# Patient Record
Sex: Female | Born: 1945 | Race: White | Hispanic: No | Marital: Married | Smoking: Never smoker
Health system: Southern US, Community
[De-identification: ages and names within clinical notes are randomized; demographics above are authoritative.]

## PROBLEM LIST (undated history)

## (undated) DIAGNOSIS — H40009 Preglaucoma, unspecified, unspecified eye: Secondary | ICD-10-CM

## (undated) DIAGNOSIS — K219 Gastro-esophageal reflux disease without esophagitis: Secondary | ICD-10-CM

## (undated) DIAGNOSIS — H269 Unspecified cataract: Secondary | ICD-10-CM

## (undated) DIAGNOSIS — M8580 Other specified disorders of bone density and structure, unspecified site: Secondary | ICD-10-CM

## (undated) DIAGNOSIS — N189 Chronic kidney disease, unspecified: Secondary | ICD-10-CM

## (undated) DIAGNOSIS — E119 Type 2 diabetes mellitus without complications: Secondary | ICD-10-CM

## (undated) DIAGNOSIS — M1990 Unspecified osteoarthritis, unspecified site: Secondary | ICD-10-CM

## (undated) DIAGNOSIS — I10 Essential (primary) hypertension: Secondary | ICD-10-CM

## (undated) DIAGNOSIS — H3530 Unspecified macular degeneration: Secondary | ICD-10-CM

## (undated) DIAGNOSIS — E079 Disorder of thyroid, unspecified: Secondary | ICD-10-CM

## (undated) DIAGNOSIS — T7840XA Allergy, unspecified, initial encounter: Secondary | ICD-10-CM

## (undated) HISTORY — DX: Preglaucoma, unspecified, unspecified eye: H40.009

## (undated) HISTORY — DX: Chronic kidney disease, unspecified: N18.9

## (undated) HISTORY — DX: Unspecified cataract: H26.9

## (undated) HISTORY — PX: OTHER SURGICAL HISTORY: SHX169

## (undated) HISTORY — DX: Unspecified macular degeneration: H35.30

## (undated) HISTORY — PX: CATARACT EXTRACTION: SUR2

## (undated) HISTORY — DX: Unspecified osteoarthritis, unspecified site: M19.90

## (undated) HISTORY — PX: LITHOTRIPSY: SUR834

## (undated) HISTORY — DX: Disorder of thyroid, unspecified: E07.9

## (undated) HISTORY — DX: Type 2 diabetes mellitus without complications: E11.9

## (undated) HISTORY — PX: COLONOSCOPY: SHX174

## (undated) HISTORY — DX: Allergy, unspecified, initial encounter: T78.40XA

## (undated) HISTORY — DX: Other specified disorders of bone density and structure, unspecified site: M85.80

## (undated) HISTORY — DX: Gastro-esophageal reflux disease without esophagitis: K21.9

## (undated) HISTORY — DX: Essential (primary) hypertension: I10

---
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
## 1984-04-22 HISTORY — PX: ABDOMINAL HYSTERECTOMY: SHX81

## 1997-04-22 HISTORY — PX: LUMBAR LAMINECTOMY: SHX95

## 1997-12-12 ENCOUNTER — Other Ambulatory Visit: Admission: RE | Admit: 1997-12-12 | Discharge: 1997-12-12 | Payer: Self-pay | Admitting: Gynecology

## 1998-10-26 ENCOUNTER — Other Ambulatory Visit: Admission: RE | Admit: 1998-10-26 | Discharge: 1998-10-26 | Payer: Self-pay | Admitting: Gastroenterology

## 1998-10-26 ENCOUNTER — Encounter (INDEPENDENT_AMBULATORY_CARE_PROVIDER_SITE_OTHER): Payer: Self-pay | Admitting: Specialist

## 1998-12-13 ENCOUNTER — Other Ambulatory Visit: Admission: RE | Admit: 1998-12-13 | Discharge: 1998-12-13 | Payer: Self-pay | Admitting: Gynecology

## 2000-02-05 ENCOUNTER — Other Ambulatory Visit: Admission: RE | Admit: 2000-02-05 | Discharge: 2000-02-05 | Payer: Self-pay | Admitting: Gynecology

## 2000-04-22 HISTORY — PX: KNEE ARTHROSCOPY: SUR90

## 2001-10-21 ENCOUNTER — Encounter (HOSPITAL_BASED_OUTPATIENT_CLINIC_OR_DEPARTMENT_OTHER): Payer: Self-pay | Admitting: General Surgery

## 2001-10-27 ENCOUNTER — Ambulatory Visit (HOSPITAL_COMMUNITY): Admission: RE | Admit: 2001-10-27 | Discharge: 2001-10-27 | Payer: Self-pay | Admitting: General Surgery

## 2001-10-27 ENCOUNTER — Encounter (INDEPENDENT_AMBULATORY_CARE_PROVIDER_SITE_OTHER): Payer: Self-pay | Admitting: *Deleted

## 2002-02-22 ENCOUNTER — Other Ambulatory Visit: Admission: RE | Admit: 2002-02-22 | Discharge: 2002-02-22 | Payer: Self-pay | Admitting: Gynecology

## 2002-08-20 ENCOUNTER — Ambulatory Visit (HOSPITAL_BASED_OUTPATIENT_CLINIC_OR_DEPARTMENT_OTHER): Admission: RE | Admit: 2002-08-20 | Discharge: 2002-08-20 | Payer: Self-pay | Admitting: Gynecology

## 2003-03-10 ENCOUNTER — Ambulatory Visit (HOSPITAL_COMMUNITY): Admission: RE | Admit: 2003-03-10 | Discharge: 2003-03-10 | Payer: Self-pay | Admitting: Urology

## 2003-07-06 ENCOUNTER — Ambulatory Visit (HOSPITAL_COMMUNITY): Admission: RE | Admit: 2003-07-06 | Discharge: 2003-07-06 | Payer: Self-pay | Admitting: Family Medicine

## 2003-12-30 ENCOUNTER — Other Ambulatory Visit: Admission: RE | Admit: 2003-12-30 | Discharge: 2003-12-30 | Payer: Self-pay | Admitting: Gynecology

## 2004-04-04 ENCOUNTER — Other Ambulatory Visit: Admission: RE | Admit: 2004-04-04 | Discharge: 2004-04-04 | Payer: Self-pay | Admitting: Gynecology

## 2004-11-29 ENCOUNTER — Ambulatory Visit: Payer: Self-pay | Admitting: Family Medicine

## 2005-01-15 ENCOUNTER — Other Ambulatory Visit: Admission: RE | Admit: 2005-01-15 | Discharge: 2005-01-15 | Payer: Self-pay | Admitting: Gynecology

## 2005-02-27 ENCOUNTER — Ambulatory Visit (HOSPITAL_COMMUNITY): Admission: RE | Admit: 2005-02-27 | Discharge: 2005-02-27 | Payer: Self-pay | Admitting: Orthopedic Surgery

## 2005-02-27 HISTORY — PX: ROTATOR CUFF REPAIR: SHX139

## 2006-01-03 ENCOUNTER — Ambulatory Visit: Payer: Self-pay | Admitting: Family Medicine

## 2006-01-14 ENCOUNTER — Ambulatory Visit: Payer: Self-pay | Admitting: Family Medicine

## 2006-01-17 ENCOUNTER — Other Ambulatory Visit: Admission: RE | Admit: 2006-01-17 | Discharge: 2006-01-17 | Payer: Self-pay | Admitting: Gynecology

## 2006-04-30 ENCOUNTER — Ambulatory Visit: Payer: Self-pay | Admitting: Internal Medicine

## 2006-06-20 ENCOUNTER — Ambulatory Visit: Payer: Self-pay | Admitting: Internal Medicine

## 2006-07-29 ENCOUNTER — Ambulatory Visit: Payer: Self-pay | Admitting: Internal Medicine

## 2006-07-29 ENCOUNTER — Encounter: Payer: Self-pay | Admitting: Internal Medicine

## 2006-07-29 DIAGNOSIS — I10 Essential (primary) hypertension: Secondary | ICD-10-CM | POA: Insufficient documentation

## 2006-10-27 ENCOUNTER — Ambulatory Visit: Payer: Self-pay | Admitting: Family Medicine

## 2006-10-27 DIAGNOSIS — J309 Allergic rhinitis, unspecified: Secondary | ICD-10-CM | POA: Insufficient documentation

## 2006-10-27 DIAGNOSIS — E039 Hypothyroidism, unspecified: Secondary | ICD-10-CM

## 2006-10-27 DIAGNOSIS — R51 Headache: Secondary | ICD-10-CM | POA: Insufficient documentation

## 2006-10-27 DIAGNOSIS — Z9889 Other specified postprocedural states: Secondary | ICD-10-CM

## 2006-10-27 DIAGNOSIS — J45909 Unspecified asthma, uncomplicated: Secondary | ICD-10-CM | POA: Insufficient documentation

## 2006-10-27 DIAGNOSIS — R519 Headache, unspecified: Secondary | ICD-10-CM | POA: Insufficient documentation

## 2006-10-27 DIAGNOSIS — Z87442 Personal history of urinary calculi: Secondary | ICD-10-CM

## 2006-10-27 DIAGNOSIS — K219 Gastro-esophageal reflux disease without esophagitis: Secondary | ICD-10-CM

## 2006-10-27 HISTORY — DX: Hypothyroidism, unspecified: E03.9

## 2006-10-29 LAB — CONVERTED CEMR LAB
ALT: 29 units/L (ref 0–35)
AST: 29 units/L (ref 0–37)
Albumin: 4.4 g/dL (ref 3.5–5.2)
Alkaline Phosphatase: 55 units/L (ref 39–117)
BUN: 17 mg/dL (ref 6–23)
Basophils Absolute: 0 10*3/uL (ref 0.0–0.1)
Basophils Relative: 0 % (ref 0.0–1.0)
Bilirubin, Direct: 0.1 mg/dL (ref 0.0–0.3)
CO2: 29 meq/L (ref 19–32)
Calcium: 9.6 mg/dL (ref 8.4–10.5)
Chloride: 111 meq/L (ref 96–112)
Cholesterol: 168 mg/dL (ref 0–200)
Creatinine, Ser: 0.7 mg/dL (ref 0.4–1.2)
Eosinophils Absolute: 0.2 10*3/uL (ref 0.0–0.6)
Eosinophils Relative: 2.5 % (ref 0.0–5.0)
GFR calc Af Amer: 109 mL/min
GFR calc non Af Amer: 90 mL/min
Glucose, Bld: 107 mg/dL — ABNORMAL HIGH (ref 70–99)
HCT: 38.1 % (ref 36.0–46.0)
HDL: 51.1 mg/dL (ref >39.0)
Hemoglobin: 13.3 g/dL (ref 12.0–15.0)
LDL Cholesterol: 99 mg/dL (ref 0–99)
Lymphocytes Relative: 45.1 % (ref 12.0–46.0)
MCHC: 35 g/dL (ref 30.0–36.0)
MCV: 91.1 fL (ref 78.0–100.0)
Monocytes Absolute: 0.4 10*3/uL (ref 0.2–0.7)
Monocytes Relative: 5.3 % (ref 3.0–11.0)
Neutro Abs: 3.3 10*3/uL (ref 1.4–7.7)
Neutrophils Relative %: 47.1 % (ref 43.0–77.0)
Platelets: 216 10*3/uL (ref 150–400)
Potassium: 4.1 meq/L (ref 3.5–5.1)
RBC: 4.19 M/uL (ref 3.87–5.11)
RDW: 13 % (ref 11.5–14.6)
Sodium: 144 meq/L (ref 135–145)
TSH: 1.13 microintl units/mL (ref 0.35–5.50)
Total Bilirubin: 0.7 mg/dL (ref 0.3–1.2)
Total CHOL/HDL Ratio: 3.3
Total Protein: 7.7 g/dL (ref 6.0–8.3)
Triglycerides: 92 mg/dL (ref 0–149)
VLDL: 18 mg/dL (ref 0–40)
WBC: 7.1 10*3/uL (ref 4.5–10.5)

## 2006-11-11 ENCOUNTER — Telehealth (INDEPENDENT_AMBULATORY_CARE_PROVIDER_SITE_OTHER): Payer: Self-pay | Admitting: *Deleted

## 2006-12-31 LAB — CONVERTED CEMR LAB: Pap Smear: ABNORMAL

## 2007-01-05 ENCOUNTER — Telehealth (INDEPENDENT_AMBULATORY_CARE_PROVIDER_SITE_OTHER): Payer: Self-pay | Admitting: *Deleted

## 2007-01-05 ENCOUNTER — Encounter: Payer: Self-pay | Admitting: Family Medicine

## 2007-01-19 ENCOUNTER — Other Ambulatory Visit: Admission: RE | Admit: 2007-01-19 | Discharge: 2007-01-19 | Payer: Self-pay | Admitting: Gynecology

## 2007-01-23 LAB — CONVERTED CEMR LAB: Pap Smear: NORMAL

## 2007-11-12 ENCOUNTER — Ambulatory Visit: Payer: Self-pay | Admitting: Family Medicine

## 2007-11-22 ENCOUNTER — Encounter (INDEPENDENT_AMBULATORY_CARE_PROVIDER_SITE_OTHER): Payer: Self-pay | Admitting: *Deleted

## 2007-11-22 LAB — CONVERTED CEMR LAB
ALT: 29 units/L (ref 0–35)
AST: 31 units/L (ref 0–37)
Albumin: 4.7 g/dL (ref 3.5–5.2)
Alkaline Phosphatase: 61 units/L (ref 39–117)
BUN: 17 mg/dL (ref 6–23)
Basophils Absolute: 0.1 10*3/uL (ref 0.0–0.1)
Basophils Relative: 0.9 % (ref 0.0–3.0)
Bilirubin, Direct: 0.1 mg/dL (ref 0.0–0.3)
CO2: 27 meq/L (ref 19–32)
Calcium: 9.5 mg/dL (ref 8.4–10.5)
Chloride: 103 meq/L (ref 96–112)
Cholesterol: 169 mg/dL (ref 0–200)
Creatinine, Ser: 0.8 mg/dL (ref 0.4–1.2)
Eosinophils Absolute: 0.1 10*3/uL (ref 0.0–0.7)
Eosinophils Relative: 1.4 % (ref 0.0–5.0)
GFR calc Af Amer: 93 mL/min
GFR calc non Af Amer: 77 mL/min
Glucose, Bld: 104 mg/dL — ABNORMAL HIGH (ref 70–99)
HCT: 38.8 % (ref 36.0–46.0)
HDL: 47 mg/dL (ref >39.0)
Hemoglobin: 13.4 g/dL (ref 12.0–15.0)
LDL Cholesterol: 103 mg/dL — ABNORMAL HIGH (ref 0–99)
Lymphocytes Relative: 38.2 % (ref 12.0–46.0)
MCHC: 34.6 g/dL (ref 30.0–36.0)
MCV: 94.1 fL (ref 78.0–100.0)
Monocytes Absolute: 0.4 10*3/uL (ref 0.1–1.0)
Monocytes Relative: 4.9 % (ref 3.0–12.0)
Neutro Abs: 4.1 10*3/uL (ref 1.4–7.7)
Neutrophils Relative %: 54.6 % (ref 43.0–77.0)
Platelets: 209 10*3/uL (ref 150–400)
Potassium: 4.1 meq/L (ref 3.5–5.1)
RBC: 4.12 M/uL (ref 3.87–5.11)
RDW: 13 % (ref 11.5–14.6)
Sodium: 141 meq/L (ref 135–145)
TSH: 1.82 microintl units/mL (ref 0.35–5.50)
Total Bilirubin: 0.8 mg/dL (ref 0.3–1.2)
Total CHOL/HDL Ratio: 3.6
Total Protein: 8.5 g/dL — ABNORMAL HIGH (ref 6.0–8.3)
Triglycerides: 94 mg/dL (ref 0–149)
VLDL: 19 mg/dL (ref 0–40)
WBC: 7.6 10*3/uL (ref 4.5–10.5)

## 2007-12-01 ENCOUNTER — Telehealth (INDEPENDENT_AMBULATORY_CARE_PROVIDER_SITE_OTHER): Payer: Self-pay | Admitting: *Deleted

## 2007-12-29 ENCOUNTER — Ambulatory Visit: Payer: Self-pay | Admitting: Family Medicine

## 2007-12-29 ENCOUNTER — Encounter (INDEPENDENT_AMBULATORY_CARE_PROVIDER_SITE_OTHER): Payer: Self-pay | Admitting: *Deleted

## 2007-12-29 ENCOUNTER — Encounter: Payer: Self-pay | Admitting: Family Medicine

## 2007-12-29 ENCOUNTER — Other Ambulatory Visit: Admission: RE | Admit: 2007-12-29 | Discharge: 2007-12-29 | Payer: Self-pay | Admitting: Family Medicine

## 2007-12-29 DIAGNOSIS — H268 Other specified cataract: Secondary | ICD-10-CM | POA: Insufficient documentation

## 2007-12-29 DIAGNOSIS — I080 Rheumatic disorders of both mitral and aortic valves: Secondary | ICD-10-CM | POA: Insufficient documentation

## 2007-12-29 DIAGNOSIS — H409 Unspecified glaucoma: Secondary | ICD-10-CM | POA: Insufficient documentation

## 2008-01-04 ENCOUNTER — Encounter (INDEPENDENT_AMBULATORY_CARE_PROVIDER_SITE_OTHER): Payer: Self-pay | Admitting: *Deleted

## 2008-02-05 ENCOUNTER — Encounter: Payer: Self-pay | Admitting: Family Medicine

## 2008-03-07 ENCOUNTER — Ambulatory Visit: Payer: Self-pay | Admitting: Family Medicine

## 2008-03-07 DIAGNOSIS — J111 Influenza due to unidentified influenza virus with other respiratory manifestations: Secondary | ICD-10-CM | POA: Insufficient documentation

## 2008-12-13 ENCOUNTER — Other Ambulatory Visit: Admission: RE | Admit: 2008-12-13 | Discharge: 2008-12-13 | Payer: Self-pay | Admitting: Family Medicine

## 2008-12-13 ENCOUNTER — Ambulatory Visit: Payer: Self-pay | Admitting: Family Medicine

## 2008-12-13 ENCOUNTER — Encounter: Payer: Self-pay | Admitting: Family Medicine

## 2008-12-13 DIAGNOSIS — E8941 Symptomatic postprocedural ovarian failure: Secondary | ICD-10-CM | POA: Insufficient documentation

## 2008-12-20 ENCOUNTER — Encounter (INDEPENDENT_AMBULATORY_CARE_PROVIDER_SITE_OTHER): Payer: Self-pay | Admitting: *Deleted

## 2008-12-22 ENCOUNTER — Ambulatory Visit: Payer: Self-pay | Admitting: Family Medicine

## 2008-12-27 ENCOUNTER — Ambulatory Visit: Payer: Self-pay | Admitting: Family Medicine

## 2008-12-27 LAB — CONVERTED CEMR LAB
OCCULT 1: NEGATIVE
OCCULT 2: NEGATIVE
OCCULT 3: NEGATIVE

## 2008-12-28 ENCOUNTER — Encounter (INDEPENDENT_AMBULATORY_CARE_PROVIDER_SITE_OTHER): Payer: Self-pay | Admitting: *Deleted

## 2008-12-29 ENCOUNTER — Telehealth (INDEPENDENT_AMBULATORY_CARE_PROVIDER_SITE_OTHER): Payer: Self-pay | Admitting: *Deleted

## 2008-12-29 ENCOUNTER — Encounter (INDEPENDENT_AMBULATORY_CARE_PROVIDER_SITE_OTHER): Payer: Self-pay | Admitting: *Deleted

## 2008-12-29 LAB — CONVERTED CEMR LAB: Hgb A1c MFr Bld: 7 % — ABNORMAL HIGH (ref 4.6–6.5)

## 2009-01-09 ENCOUNTER — Telehealth: Payer: Self-pay | Admitting: Family Medicine

## 2009-01-09 ENCOUNTER — Ambulatory Visit: Payer: Self-pay | Admitting: Family Medicine

## 2009-01-09 DIAGNOSIS — N390 Urinary tract infection, site not specified: Secondary | ICD-10-CM

## 2009-01-09 LAB — CONVERTED CEMR LAB
Bilirubin Urine: NEGATIVE
Glucose, Urine, Semiquant: NEGATIVE
Ketones, urine, test strip: NEGATIVE
Nitrite: NEGATIVE
Protein, U semiquant: NEGATIVE
Specific Gravity, Urine: 1.015
Urobilinogen, UA: 0.2
pH: 6.5

## 2009-01-10 ENCOUNTER — Encounter: Payer: Self-pay | Admitting: Family Medicine

## 2009-01-13 ENCOUNTER — Ambulatory Visit: Payer: Self-pay | Admitting: Family Medicine

## 2009-01-13 DIAGNOSIS — E119 Type 2 diabetes mellitus without complications: Secondary | ICD-10-CM | POA: Insufficient documentation

## 2009-01-13 DIAGNOSIS — E1151 Type 2 diabetes mellitus with diabetic peripheral angiopathy without gangrene: Secondary | ICD-10-CM

## 2009-01-13 DIAGNOSIS — E1165 Type 2 diabetes mellitus with hyperglycemia: Secondary | ICD-10-CM

## 2009-01-19 ENCOUNTER — Telehealth: Payer: Self-pay | Admitting: Family Medicine

## 2009-01-23 ENCOUNTER — Telehealth (INDEPENDENT_AMBULATORY_CARE_PROVIDER_SITE_OTHER): Payer: Self-pay | Admitting: *Deleted

## 2009-02-01 ENCOUNTER — Encounter: Admission: RE | Admit: 2009-02-01 | Discharge: 2009-04-19 | Payer: Self-pay | Admitting: Family Medicine

## 2009-02-04 ENCOUNTER — Encounter: Payer: Self-pay | Admitting: Family Medicine

## 2009-02-06 ENCOUNTER — Encounter: Payer: Self-pay | Admitting: Family Medicine

## 2009-02-15 ENCOUNTER — Encounter: Payer: Self-pay | Admitting: Family Medicine

## 2009-03-06 ENCOUNTER — Encounter (INDEPENDENT_AMBULATORY_CARE_PROVIDER_SITE_OTHER): Payer: Self-pay | Admitting: *Deleted

## 2009-03-20 ENCOUNTER — Telehealth: Payer: Self-pay | Admitting: Family Medicine

## 2009-03-27 ENCOUNTER — Ambulatory Visit: Payer: Self-pay | Admitting: Family Medicine

## 2009-03-27 DIAGNOSIS — M949 Disorder of cartilage, unspecified: Secondary | ICD-10-CM

## 2009-03-27 DIAGNOSIS — M899 Disorder of bone, unspecified: Secondary | ICD-10-CM | POA: Insufficient documentation

## 2009-03-28 ENCOUNTER — Telehealth (INDEPENDENT_AMBULATORY_CARE_PROVIDER_SITE_OTHER): Payer: Self-pay | Admitting: *Deleted

## 2009-03-28 LAB — CONVERTED CEMR LAB
ALT: 27 units/L (ref 0–35)
AST: 25 units/L (ref 0–37)
Albumin: 4.6 g/dL (ref 3.5–5.2)
Alkaline Phosphatase: 66 units/L (ref 39–117)
BUN: 13 mg/dL (ref 6–23)
Bilirubin, Direct: 0 mg/dL (ref 0.0–0.3)
CO2: 30 meq/L (ref 19–32)
Calcium: 9.7 mg/dL (ref 8.4–10.5)
Chloride: 105 meq/L (ref 96–112)
Cholesterol: 140 mg/dL (ref 0–200)
Creatinine, Ser: 0.8 mg/dL (ref 0.4–1.2)
Creatinine,U: 54.9 mg/dL
Free T4: 2.1 ng/dL — ABNORMAL HIGH (ref 0.6–1.6)
GFR calc non Af Amer: 76.4 mL/min (ref >60)
Glucose, Bld: 109 mg/dL — ABNORMAL HIGH (ref 70–99)
HDL: 47.3 mg/dL (ref >39.00)
Hgb A1c MFr Bld: 6.4 % (ref 4.6–6.5)
LDL Cholesterol: 77 mg/dL (ref 0–99)
Microalb Creat Ratio: 14.6 mg/g (ref 0.0–30.0)
Microalb, Ur: 0.8 mg/dL (ref 0.0–1.9)
Potassium: 4.2 meq/L (ref 3.5–5.1)
Sodium: 146 meq/L — ABNORMAL HIGH (ref 135–145)
T3, Free: 3.9 pg/mL (ref 2.3–4.2)
TSH: 0.08 microintl units/mL — ABNORMAL LOW (ref 0.35–5.50)
Total Bilirubin: 0.7 mg/dL (ref 0.3–1.2)
Total CHOL/HDL Ratio: 3
Total Protein: 7.7 g/dL (ref 6.0–8.3)
Triglycerides: 80 mg/dL (ref 0.0–149.0)
VLDL: 16 mg/dL (ref 0.0–40.0)

## 2009-03-29 ENCOUNTER — Encounter (INDEPENDENT_AMBULATORY_CARE_PROVIDER_SITE_OTHER): Payer: Self-pay | Admitting: *Deleted

## 2009-03-29 LAB — CONVERTED CEMR LAB: Vit D, 25-Hydroxy: 87 ng/mL (ref 30–89)

## 2009-03-30 ENCOUNTER — Telehealth (INDEPENDENT_AMBULATORY_CARE_PROVIDER_SITE_OTHER): Payer: Self-pay | Admitting: *Deleted

## 2009-05-02 ENCOUNTER — Encounter: Payer: Self-pay | Admitting: Family Medicine

## 2009-06-20 ENCOUNTER — Telehealth: Payer: Self-pay | Admitting: Family Medicine

## 2009-06-21 ENCOUNTER — Ambulatory Visit: Payer: Self-pay | Admitting: Family Medicine

## 2009-07-03 LAB — CONVERTED CEMR LAB
ALT: 25 units/L (ref 0–35)
AST: 20 units/L (ref 0–37)
Albumin: 4.4 g/dL (ref 3.5–5.2)
Alkaline Phosphatase: 66 units/L (ref 39–117)
BUN: 16 mg/dL (ref 6–23)
Bilirubin, Direct: 0.1 mg/dL (ref 0.0–0.3)
CO2: 30 meq/L (ref 19–32)
Calcium: 9.4 mg/dL (ref 8.4–10.5)
Chloride: 104 meq/L (ref 96–112)
Cholesterol: 130 mg/dL (ref 0–200)
Creatinine, Ser: 0.8 mg/dL (ref 0.4–1.2)
Creatinine,U: 10.4 mg/dL
GFR calc non Af Amer: 76.34 mL/min (ref >60)
Glucose, Bld: 118 mg/dL — ABNORMAL HIGH (ref 70–99)
HDL: 59.9 mg/dL (ref >39.00)
Hgb A1c MFr Bld: 6.3 % (ref 4.6–6.5)
LDL Cholesterol: 56 mg/dL (ref 0–99)
Microalb Creat Ratio: 28.8 mg/g (ref 0.0–30.0)
Microalb, Ur: 0.3 mg/dL (ref 0.0–1.9)
Potassium: 4.8 meq/L (ref 3.5–5.1)
Sodium: 141 meq/L (ref 135–145)
Total Bilirubin: 0.5 mg/dL (ref 0.3–1.2)
Total CHOL/HDL Ratio: 2
Total Protein: 8.3 g/dL (ref 6.0–8.3)
Triglycerides: 69 mg/dL (ref 0.0–149.0)
VLDL: 13.8 mg/dL (ref 0.0–40.0)

## 2009-07-25 ENCOUNTER — Telehealth (INDEPENDENT_AMBULATORY_CARE_PROVIDER_SITE_OTHER): Payer: Self-pay | Admitting: *Deleted

## 2009-07-31 ENCOUNTER — Telehealth: Payer: Self-pay | Admitting: Family Medicine

## 2009-07-31 ENCOUNTER — Encounter: Payer: Self-pay | Admitting: Family Medicine

## 2009-08-23 ENCOUNTER — Encounter: Payer: Self-pay | Admitting: Family Medicine

## 2009-10-25 ENCOUNTER — Telehealth (INDEPENDENT_AMBULATORY_CARE_PROVIDER_SITE_OTHER): Payer: Self-pay | Admitting: *Deleted

## 2009-12-14 ENCOUNTER — Ambulatory Visit: Payer: Self-pay | Admitting: Family Medicine

## 2009-12-14 DIAGNOSIS — H9319 Tinnitus, unspecified ear: Secondary | ICD-10-CM | POA: Insufficient documentation

## 2009-12-14 DIAGNOSIS — D239 Other benign neoplasm of skin, unspecified: Secondary | ICD-10-CM | POA: Insufficient documentation

## 2009-12-18 ENCOUNTER — Encounter (INDEPENDENT_AMBULATORY_CARE_PROVIDER_SITE_OTHER): Payer: Self-pay | Admitting: *Deleted

## 2009-12-29 ENCOUNTER — Encounter: Payer: Self-pay | Admitting: Family Medicine

## 2010-01-17 ENCOUNTER — Ambulatory Visit: Payer: Self-pay | Admitting: Family Medicine

## 2010-01-18 LAB — CONVERTED CEMR LAB: Fecal Occult Bld: NEGATIVE

## 2010-02-01 ENCOUNTER — Encounter: Payer: Self-pay | Admitting: Family Medicine

## 2010-02-07 ENCOUNTER — Encounter: Payer: Self-pay | Admitting: Family Medicine

## 2010-02-12 ENCOUNTER — Ambulatory Visit: Payer: Self-pay | Admitting: Family Medicine

## 2010-02-14 LAB — CONVERTED CEMR LAB
ALT: 23 units/L (ref 0–35)
AST: 21 units/L (ref 0–37)
Albumin: 4.5 g/dL (ref 3.5–5.2)
Alkaline Phosphatase: 52 units/L (ref 39–117)
BUN: 22 mg/dL (ref 6–23)
Bilirubin, Direct: 0.1 mg/dL (ref 0.0–0.3)
CO2: 26 meq/L (ref 19–32)
Calcium: 9.6 mg/dL (ref 8.4–10.5)
Chloride: 104 meq/L (ref 96–112)
Cholesterol: 114 mg/dL (ref 0–200)
Creatinine, Ser: 1 mg/dL (ref 0.4–1.2)
GFR calc non Af Amer: 62.49 mL/min (ref >60)
Glucose, Bld: 106 mg/dL — ABNORMAL HIGH (ref 70–99)
HDL: 46.3 mg/dL (ref >39.00)
Hgb A1c MFr Bld: 6.4 % (ref 4.6–6.5)
LDL Cholesterol: 56 mg/dL (ref 0–99)
Potassium: 5.1 meq/L (ref 3.5–5.1)
Sodium: 139 meq/L (ref 135–145)
TSH: 0.42 microintl units/mL (ref 0.35–5.50)
Total Bilirubin: 0.5 mg/dL (ref 0.3–1.2)
Total CHOL/HDL Ratio: 2
Total Protein: 7.6 g/dL (ref 6.0–8.3)
Triglycerides: 58 mg/dL (ref 0.0–149.0)
VLDL: 11.6 mg/dL (ref 0.0–40.0)

## 2010-02-23 ENCOUNTER — Encounter: Payer: Self-pay | Admitting: Family Medicine

## 2010-05-08 ENCOUNTER — Telehealth (INDEPENDENT_AMBULATORY_CARE_PROVIDER_SITE_OTHER): Payer: Self-pay | Admitting: *Deleted

## 2010-05-08 ENCOUNTER — Telehealth: Payer: Self-pay | Admitting: Family Medicine

## 2010-05-15 ENCOUNTER — Ambulatory Visit
Admission: RE | Admit: 2010-05-15 | Discharge: 2010-05-15 | Payer: Self-pay | Source: Home / Self Care | Attending: Family Medicine | Admitting: Family Medicine

## 2010-05-15 ENCOUNTER — Other Ambulatory Visit: Payer: Self-pay | Admitting: Family Medicine

## 2010-05-15 ENCOUNTER — Telehealth: Payer: Self-pay | Admitting: Family Medicine

## 2010-05-15 LAB — BASIC METABOLIC PANEL
BUN: 14 mg/dL (ref 6–23)
CO2: 28 mEq/L (ref 19–32)
Calcium: 9.8 mg/dL (ref 8.4–10.5)
Chloride: 105 mEq/L (ref 96–112)
Creatinine, Ser: 0.9 mg/dL (ref 0.4–1.2)
GFR: 64.79 mL/min (ref >60.00)
Glucose, Bld: 119 mg/dL — ABNORMAL HIGH (ref 70–99)
Potassium: 5.1 mEq/L (ref 3.5–5.1)
Sodium: 142 mEq/L (ref 135–145)

## 2010-05-15 LAB — HEPATIC FUNCTION PANEL
ALT: 25 U/L (ref 0–35)
AST: 23 U/L (ref 0–37)
Albumin: 4.5 g/dL (ref 3.5–5.2)
Alkaline Phosphatase: 55 U/L (ref 39–117)
Bilirubin, Direct: 0.1 mg/dL (ref 0.0–0.3)
Total Bilirubin: 0.5 mg/dL (ref 0.3–1.2)
Total Protein: 7.8 g/dL (ref 6.0–8.3)

## 2010-05-15 LAB — LIPID PANEL
Cholesterol: 133 mg/dL (ref 0–200)
HDL: 51.1 mg/dL (ref >39.00)
LDL Cholesterol: 64 mg/dL (ref 0–99)
Total CHOL/HDL Ratio: 3
Triglycerides: 88 mg/dL (ref 0.0–149.0)
VLDL: 17.6 mg/dL (ref 0.0–40.0)

## 2010-05-15 LAB — TSH: TSH: 0.89 u[IU]/mL (ref 0.35–5.50)

## 2010-05-15 LAB — HEMOGLOBIN A1C: Hgb A1c MFr Bld: 6.5 % (ref 4.6–6.5)

## 2010-05-20 LAB — CONVERTED CEMR LAB
ALT: 28 units/L (ref 0–35)
ALT: 29 units/L (ref 0–35)
AST: 26 units/L (ref 0–37)
AST: 27 units/L (ref 0–37)
Albumin: 4.3 g/dL (ref 3.5–5.2)
Albumin: 4.5 g/dL (ref 3.5–5.2)
Alkaline Phosphatase: 54 units/L (ref 39–117)
Alkaline Phosphatase: 71 units/L (ref 39–117)
BUN: 18 mg/dL (ref 6–23)
BUN: 18 mg/dL (ref 6–23)
Basophils Absolute: 0 10*3/uL (ref 0.0–0.1)
Basophils Absolute: 0.1 10*3/uL (ref 0.0–0.1)
Basophils Relative: 0.5 % (ref 0.0–3.0)
Basophils Relative: 0.8 % (ref 0.0–3.0)
Bilirubin Urine: NEGATIVE
Bilirubin, Direct: 0 mg/dL (ref 0.0–0.3)
Bilirubin, Direct: 0.1 mg/dL (ref 0.0–0.3)
Blood in Urine, dipstick: NEGATIVE
CO2: 27 meq/L (ref 19–32)
CO2: 29 meq/L (ref 19–32)
Calcium: 9.4 mg/dL (ref 8.4–10.5)
Calcium: 9.9 mg/dL (ref 8.4–10.5)
Chloride: 107 meq/L (ref 96–112)
Chloride: 109 meq/L (ref 96–112)
Cholesterol: 119 mg/dL (ref 0–200)
Cholesterol: 137 mg/dL (ref 0–200)
Creatinine, Ser: 0.8 mg/dL (ref 0.4–1.2)
Creatinine, Ser: 0.8 mg/dL (ref 0.4–1.2)
Creatinine,U: 42.2 mg/dL
Eosinophils Absolute: 0.1 10*3/uL (ref 0.0–0.7)
Eosinophils Absolute: 0.1 10*3/uL (ref 0.0–0.7)
Eosinophils Relative: 1.7 % (ref 0.0–5.0)
Eosinophils Relative: 1.9 % (ref 0.0–5.0)
Folate: >20 ng/mL
GFR calc non Af Amer: 76.23 mL/min (ref >60)
GFR calc non Af Amer: 76.47 mL/min (ref >60)
Glucose, Bld: 121 mg/dL — ABNORMAL HIGH (ref 70–99)
Glucose, Bld: 131 mg/dL — ABNORMAL HIGH (ref 70–99)
Glucose, Urine, Semiquant: NEGATIVE
HCT: 39.4 % (ref 36.0–46.0)
HCT: 40.5 % (ref 36.0–46.0)
HDL: 44.4 mg/dL (ref >39.00)
HDL: 44.7 mg/dL (ref >39.00)
Hemoglobin: 13.3 g/dL (ref 12.0–15.0)
Hemoglobin: 14 g/dL (ref 12.0–15.0)
Hgb A1c MFr Bld: 6.6 % — ABNORMAL HIGH (ref 4.6–6.5)
Ketones, urine, test strip: NEGATIVE
LDL Cholesterol: 61 mg/dL (ref 0–99)
LDL Cholesterol: 79 mg/dL (ref 0–99)
Lymphocytes Relative: 44.8 % (ref 12.0–46.0)
Lymphocytes Relative: 45 % (ref 12.0–46.0)
Lymphs Abs: 3.2 10*3/uL (ref 0.7–4.0)
Lymphs Abs: 3.3 10*3/uL (ref 0.7–4.0)
MCHC: 33.8 g/dL (ref 30.0–36.0)
MCHC: 34.6 g/dL (ref 30.0–36.0)
MCV: 93.3 fL (ref 78.0–100.0)
MCV: 93.4 fL (ref 78.0–100.0)
Magnesium: 2.1 mg/dL (ref 1.5–2.5)
Microalb Creat Ratio: 2.1 mg/g (ref 0.0–30.0)
Microalb, Ur: 0.9 mg/dL (ref 0.0–1.9)
Monocytes Absolute: 0.4 10*3/uL (ref 0.1–1.0)
Monocytes Absolute: 0.4 10*3/uL (ref 0.1–1.0)
Monocytes Relative: 5.1 % (ref 3.0–12.0)
Monocytes Relative: 5.5 % (ref 3.0–12.0)
Neutro Abs: 3.4 10*3/uL (ref 1.4–7.7)
Neutro Abs: 3.4 10*3/uL (ref 1.4–7.7)
Neutrophils Relative %: 47.3 % (ref 43.0–77.0)
Neutrophils Relative %: 47.4 % (ref 43.0–77.0)
Nitrite: NEGATIVE
Phosphorus: 4.1 mg/dL (ref 2.3–4.6)
Platelets: 207 10*3/uL (ref 150.0–400.0)
Platelets: 250 10*3/uL (ref 150.0–400.0)
Potassium: 4.9 meq/L (ref 3.5–5.1)
Potassium: 5.2 meq/L — ABNORMAL HIGH (ref 3.5–5.1)
Protein, U semiquant: NEGATIVE
RBC: 4.22 M/uL (ref 3.87–5.11)
RBC: 4.34 M/uL (ref 3.87–5.11)
RDW: 13.5 % (ref 11.5–14.6)
RDW: 14 % (ref 11.5–14.6)
Sodium: 141 meq/L (ref 135–145)
Sodium: 143 meq/L (ref 135–145)
Specific Gravity, Urine: <1.005
TSH: 0.14 microintl units/mL — ABNORMAL LOW (ref 0.35–5.50)
TSH: 1.81 microintl units/mL (ref 0.35–5.50)
Total Bilirubin: 0.4 mg/dL (ref 0.3–1.2)
Total Bilirubin: 0.7 mg/dL (ref 0.3–1.2)
Total CHOL/HDL Ratio: 3
Total CHOL/HDL Ratio: 3
Total Protein: 7.4 g/dL (ref 6.0–8.3)
Total Protein: 7.9 g/dL (ref 6.0–8.3)
Triglycerides: 66 mg/dL (ref 0.0–149.0)
Triglycerides: 67 mg/dL (ref 0.0–149.0)
Urobilinogen, UA: NEGATIVE
VLDL: 13.2 mg/dL (ref 0.0–40.0)
VLDL: 13.4 mg/dL (ref 0.0–40.0)
Vit D, 25-Hydroxy: 83 ng/mL (ref 30–89)
Vitamin B-12: 466 pg/mL (ref 211–911)
WBC Urine, dipstick: NEGATIVE
WBC: 7.2 10*3/uL (ref 4.5–10.5)
WBC: 7.3 10*3/uL (ref 4.5–10.5)
pH: 6

## 2010-05-22 NOTE — Letter (Signed)
Summary: [facility]: Immunoassay Fecal Occult Blood (iFOB) Order Form  [facility] [place]   [place], [place] [address]   Phone: [phone]  Fax: [phone]      [facility]: Immunoassay Fecal Occult Blood (iFOB) Order Form   December 14, 2009 MRN: [id number]   Cheryl Cisneros 11/24/1945   Physicican Name:_Dr.Lowne________________________  Diagnosis Code:__v76.51________________________      Aron Baba CMA ([place])

## 2010-05-22 NOTE — Consult Note (Signed)
Summary: [facility]  [facility]   Imported By: Edmonia James 01/10/2010 08:57:04  _____________________________________________________________________  External Attachment:    Type:   Image     Comment:   External Document

## 2010-05-22 NOTE — Medication Information (Signed)
Summary: Fax Regarding [facility] or [facility]  Fax Regarding [facility]   Imported By: Edmonia James 03/08/2010 10:03:51  _____________________________________________________________________  External Attachment:    Type:   Image     Comment:   External Document

## 2010-05-22 NOTE — Progress Notes (Signed)
Summary: [place]  Phone Note Call from Patient   Caller: Patient Summary of Call: refill for glucophage on [id number] was [facility] - patient is in [place]- can  it be faxed to [facility] in [place] [phone] - info was on refill request Initial call taken by: Arbie Coo[place],  July 31, 2009 3:38 PM  Follow-up for Phone Call        ok to refill x1---  2 refills Follow-up by: Garnet Koyanagi DO,  August 01, 2009 11:02 AM    Prescriptions: GLUCOPHAGE 500 MG TABS (METFORMIN HCL) 1 by mouth two times a day  #60 Each x 2   Entered by:   Allyn Kenner CMA   Authorized by:   Garnet Koyanagi DO   Signed by:   Allyn Kenner CMA on 08/01/2009   Method used:   Printed then faxed to ...       [facility] (retail)       [address] [place], [place]  [address]       Ph: [phone]       Fax: [phone]   RxID:   [id number]

## 2010-05-22 NOTE — Letter (Signed)
Summary: Primary Care Consult Scheduled Letter  [facility]   [place], [place] [address]   Phone: [phone]  Fax: [phone]      12/18/2009 MRN: [id number]  Cheryl Cisneros [address] [place], [place]  [address]    Dear Ms. Maler,    We have scheduled an appointment for you.  At the recommendation of Dr. Garnet Koyanagi, we have scheduled you a consult with Dr. Melissa Montane of [facility] on 12-29-2009 at 1:40pm.  Their address is [address], [address], [place] [address]. The office phone number is [phone].  If this appointment day and time is not convenient for you, please feel free to call the office of the doctor you are being referred to at the number listed above and reschedule the appointment.    It is important for you to keep your scheduled appointments. We are here to make sure you are given good patient care.   Thank you,    Renee, Patient Care Coordinator [place] at [facility]

## 2010-05-22 NOTE — Progress Notes (Signed)
Summary: Whats labs does she need ordered  Phone Note Call from Patient   Caller: Patient Summary of Call: Pt. called and stated she needed to make a lab appointment before she comes in to see Dr. Etter Sjogren, so i scheduled her with lab tomorrow on 06/21/09, but I need to know what labs she needs? Send them back to me & I will enter in with her lab appointment. Thank you.Otho Ket  June 20, 2009 10:46 AM   Follow-up for Phone Call        272.4  250.00  lipid, hep, bmp, hgba1c, microalbumin Follow-up by: Garnet Koyanagi DO,  June 20, 2009 12:06 PM  Additional Follow-up for Phone Call Additional follow up Details #1::        scheduled and enter labs to be drawn Additional Follow-up by: Otho Ket,  June 20, 2009 12:43 PM

## 2010-05-22 NOTE — Medication Information (Signed)
Summary: [facility]  [facility]   Imported By: Edmonia James 05/09/2009 13:14:42  _____________________________________________________________________  External Attachment:    Type:   Image     Comment:   External Document

## 2010-05-22 NOTE — Letter (Signed)
Summary: Primary Care Consult Scheduled Letter  [facility]   [place], [place] [address]   Phone: [phone]  Fax: [phone]      12/18/2009 MRN: [id number]  FERNANDA LAMM [address] [place], [place]  [address]    Dear Ms. Lennox,    We have scheduled an appointment for you.  At the recommendation of Dr. Garnet Koyanagi, we have scheduled you a consult with Dr. Jarome Matin of [facility] on 02-19-2010 arrive by 10:25am.  Their address is [place], [place] [address]. The office phone number is [phone].  If this appointment day and time is not convenient for you, please feel free to call the office of the doctor you are being referred to at the number listed above and reschedule the appointment.     It is important for you to keep your scheduled appointments. We are here to make sure you are given good patient care.   Thank you,    Renee, Patient Care Coordinator [place] at [facility]

## 2010-05-22 NOTE — Progress Notes (Signed)
Summary: refill  Phone Note Refill Request Message from:  Fax from Pharmacy on October 25, 2009 8:56 AM  Refills Requested: Medication #1:  GLUCOPHAGE 500 MG TABS 1 by mouth two times a day [facility] - fax [phone] - phone [phone]  Initial call taken by: Arbie Coo[place],  October 25, 2009 8:57 AM    Prescriptions: GLUCOPHAGE 500 MG TABS (METFORMIN HCL) 1 by mouth two times a day  #60 Each x 3   Entered by:   Rolla Flatten CMA   Authorized by:   Garnet Koyanagi DO   Signed by:   Rolla Flatten CMA on 10/25/2009   Method used:   Faxed to ...       [facility] (retail)       [address] [place], [place]  [address]       Ph: [phone]       Fax: [phone]   RxID:   [id number]

## 2010-05-22 NOTE — Assessment & Plan Note (Signed)
Summary: cpx & lab/cbs   Vital Signs:  Patient profile:   65 year old female Menstrual status:  hysterectomy Height:      61.5 inches Weight:      150 pounds BMI:     27.98 Temp:     98.1 degrees F oral BP sitting:   126 / 78  (left arm)  Vitals Entered By: Aron Baba CMA Deborra Medina) (December 14, 2009 8:42 AM) CC: CPX/ Pt fasting, Mammogram due  Does patient need assistance? Functional Status Self care, Cook/clean, Shopping, Social activities Ambulation Normal Comments pt is able to read and write and perform all adls.    Vision Screening:      Vision Comments: sees ophtho 1x a year and vision corrected with glasses  40db HL: Left  Right  Audiometry Comment: pt hearing grossly normal      Menstrual Status hysterectomy Last PAP Result NEGATIVE FOR INTRAEPITHELIAL LESIONS OR MALIGNANCY.   History of Present Illness: Pt here for cpe and labs.    Type 1 diabetes mellitus follow-up      This is a 65 year old woman who presents with Type 2 diabetes mellitus follow-up.  The problem began >1 year ago.  The patient denies polyuria, polydipsia, blurred vision, self managed hypoglycemia, hypoglycemia requiring help, weight loss, weight gain, and numbness of extremities.  The patient denies the following symptoms: neuropathic pain, chest pain, vomiting, orthostatic symptoms, poor wound healing, intermittent claudication, vision loss, and foot ulcer.  Since the last visit the patient reports good dietary compliance, compliance with medications, exercising regularly, and monitoring blood glucose.  The patient has been measuring capillary blood glucose before breakfast and after dinner.  Since the last visit, the patient reports having had eye care by an ophthalmologist and foot care by a podiatrist.    Hypertension follow-up      The patient also presents for Hypertension follow-up.  The patient denies lightheadedness, urinary frequency, headaches, edema, impotence, rash, and fatigue.  The  patient denies the following associated symptoms: chest pain, chest pressure, exercise intolerance, dyspnea, palpitations, syncope, leg edema, and pedal edema.  Compliance with medications (by patient report) has been near 100%.  The patient reports that dietary compliance has been good.  The patient reports exercising daily.  Adjunctive measures currently used by the patient include salt restriction.    Asthma History    Initial Asthma Severity Rating:    Age range: 12+ years    Symptoms: 0-2 days/week    Nighttime Awakenings: 0-2/month    Interferes w/ normal activity: no limitations    SABA use (not for EIB): 0-2 days/week    Exacerbations requiring oral systemic steroids: 0-1/year    Asthma Severity Assessment: Intermittent   Preventive Screening-Counseling & Management  Alcohol-Tobacco     Alcohol drinks/day: 0     Smoking Status: never  Caffeine-Diet-Exercise     Caffeine use/day: 0     Does Patient Exercise: yes     Type of exercise: boot camp--cardio and strengthing     Exercise (avg: min/session): 30-60     Times/week: 7     Exercise Counseling: not indicated; exercise is adequate  Hep-HIV-STD-Contraception     HIV Risk: no     Dental Visit-last 6 months yes     Dental Care Counseling: not indicated; dental care within six months     SBE monthly: yes     Sun Exposure-Excessive: no  Safety-Violence-Falls     Seat Belt Use: yes  Firearms in the Home: no firearms in the home     Smoke Detectors: yes     Violence in the Home: no risk noted     Sexual Abuse: no     Fall Risk: no     Fall Risk Counseling: not indicated; no significant falls noted  Current Medications (verified): 1)  Omeprazole 40 Mg  Cpdr (Omeprazole) .Marland Kitchen.. 1 By Mouth Once Daily 2)  Norvasc 5 Mg Tabs (Amlodipine Besylate) .... Take 1 Tablet By Mouth Every Night 3)  Advair Diskus 100-50 Mcg/dose  Misc (Fluticasone-Salmeterol) .Marland Kitchen.. 1 Inh Two Times A Day 4)  Astelin 137 Mcg/spray  Soln (Azelastine  Hcl) .... As Needed 5)  Nasacort Aq 55 Mcg/act  Aers (Triamcinolone Acetonide(Nasal)) .... As Needed 6)  Adult Aspirin Ec Low Strength 81 Mg  Tbec (Aspirin) .Marland Kitchen.. 1 Once Daily 7)  L-Lysine 500 Mg  Tabs (Lysine) .Marland Kitchen.. 1 Once Daily 8)  Magnesium   Caps (Magnesium Oxide Caps) .... 250mg  1 By Mouth Two Times A Day 9)  Super Garlic 123XX123 Mg  Caps (Garlic) .... 2 Once Daily 10)  Calcium With Vit D .... 600 Mg  Three Times A Day 11)  Aleve   Tabs (Naproxen Sodium Tabs) .... Two Times A Day 12)  Claritin 10 Mg Tabs (Loratadine) .Marland Kitchen.. 1 By Mouth Once Daily 13)  Glucophage 500 Mg Tabs (Metformin Hcl) .Marland Kitchen.. 1 By Mouth Two Times A Day 14)  Onetouch Ultra Test  Strp (Glucose Blood) .... Test Two Times A Day Dx 250.00 15)  Freestyle Lancets  Misc (Lancets) .... Use As Directed Qid.  Dm- 250.00 16)  Zyrtec Allergy 10 Mg Caps (Cetirizine Hcl) .Marland Kitchen.. 1 By Mouth Once Daily 17)  Dm Shoes .Marland KitchenMarland Kitchen. 250.00 18)  Mega Red--- Omega 3 Ff .Marland Kitchen.. 1 By Mouth Once Daily 19)  Vitamin D3 1000 Unit Caps (Cholecalciferol) .Marland Kitchen.. 1 By Mouth Once Daily 20)  Synthroid 125 Mcg Tabs (Levothyroxine Sodium) .Marland Kitchen.. 1 By Mouth Once Daily. 21)  Pravachol 10 Mg Tabs (Pravastatin Sodium) .Marland Kitchen.. 1 By Mouth At Bedtime. 22)  Diabetic Shoes .... Dx- 250.00  Allergies (verified): No Known Drug Allergies  Past History:  Past Medical History: Last updated: 01/13/2009 Hypertension Hypothyroidism Allergic rhinitis Asthma GERD Headache Current Problems:  Hx of NEPHROLITHIASIS, HX OF (ICD-V13.01) POSTPROCEDURAL STATUS NEC (ICD-V45.89) Hx of ARTHROSCOPY, KNEE, HX OF (ICD-V45.89) HEADACHE (ICD-784.0) GERD (ICD-530.81) ASTHMA (ICD-493.90) ALLERGIC RHINITIS (ICD-477.9) HYPOTHYROIDISM (ICD-244.9) HYPERTENSION (ICD-401.9) vaginal scar tissue removal--08/20/02 Diabetes mellitus, type II (01/13/2009)  Past Surgical History: Last updated: 12/29/2007 Hysterectomy 1986 Tonsillectomy Current Problems:  Hx of NEPHROLITHIASIS, HX OF (ICD-V13.01)---   lithiotripsy POSTPROCEDURAL STATUS NEC (ICD-V45.89) Hx of ARTHROSCOPY, KNEE, HX OF (ICD-V45.89) HEADACHE (ICD-784.0) GERD (ICD-530.81) ASTHMA (ICD-493.90) ALLERGIC RHINITIS (ICD-477.9) HYPOTHYROIDISM (ICD-244.9) HYPERTENSION (ICD-401.9) Lumbar laminectomy (1999)? Rotator cuff repair (02/27/2005)  Family History: Last updated: 12/29/2007 Family History Diabetes 1st degree relative Family History of CAD Female 1st degree relative about 51 M---died 54 complications DM Family History Lung cancer ? Family History Hypertension  Social History: Last updated: 12/29/2007 Retired-- Scientist, research (medical) Married Never Smoked Alcohol use-no Drug use-no Regular exercise-yes  Risk Factors: Alcohol Use: 0 (12/14/2009) Caffeine Use: 0 (12/14/2009) Exercise: yes (12/14/2009)  Risk Factors: Smoking Status: never (12/14/2009)  Family History: Reviewed history from 12/29/2007 and no changes required. Family History Diabetes 1st degree relative Family History of CAD Female 1st degree relative about 43 M---died 65 complications DM Family History Lung cancer ? Family History Hypertension  Social History: Reviewed history from 12/29/2007 and no  changes required. Retired-- Scientist, research (medical) Married Never Smoked Alcohol use-no Drug use-no Regular exercise-yes Fall Risk:  no  Review of Systems      See HPI General:  Denies chills, fatigue, fever, loss of appetite, malaise, sleep disorder, sweats, weakness, and weight loss. Eyes:  Denies blurring, discharge, double vision, eye irritation, eye pain, halos, itching, light sensitivity, red eye, vision loss-1 eye, and vision loss-both eyes. ENT:  Denies decreased hearing, difficulty swallowing, ear discharge, earache, hoarseness, nasal congestion, nosebleeds, postnasal drainage, ringing in ears, sinus pressure, and sore throat. CV:  Denies bluish discoloration of lips or nails, chest pain or discomfort, difficulty breathing at night, difficulty breathing while lying  down, fainting, fatigue, leg cramps with exertion, lightheadness, near fainting, palpitations, shortness of breath with exertion, swelling of feet, swelling of hands, and weight gain. Resp:  Denies chest discomfort, chest pain with inspiration, cough, coughing up blood, excessive snoring, hypersomnolence, morning headaches, pleuritic, shortness of breath, sputum productive, and wheezing. GI:  Denies abdominal pain, bloody stools, change in bowel habits, constipation, dark tarry stools, diarrhea, excessive appetite, gas, hemorrhoids, indigestion, loss of appetite, and nausea. GU:  Denies abnormal vaginal bleeding, decreased libido, discharge, dysuria, genital sores, hematuria, incontinence, nocturia, urinary frequency, and urinary hesitancy. MS:  Denies joint pain, joint redness, joint swelling, loss of strength, low back pain, mid back pain, muscle aches, muscle , cramps, muscle weakness, stiffness, and thoracic pain. Derm:  Denies changes in color of skin, changes in nail beds, dryness, excessive perspiration, flushing, hair loss, insect bite(s), itching, lesion(s), poor wound healing, and rash. Neuro:  Denies brief paralysis, difficulty with concentration, disturbances in coordination, falling down, headaches, inability to speak, memory loss, numbness, poor balance, seizures, sensation of room spinning, tingling, tremors, visual disturbances, and weakness. Psych:  Denies alternate hallucination ( auditory/visual), anxiety, depression, easily angered, easily tearful, irritability, mental problems, panic attacks, sense of great danger, suicidal thoughts/plans, thoughts of violence, unusual visions or sounds, and thoughts /plans of harming others. Endo:  Denies cold intolerance, excessive hunger, excessive thirst, excessive urination, heat intolerance, polyuria, and weight change. Heme:  Denies abnormal bruising, bleeding, enlarge lymph nodes, fevers, pallor, and skin discoloration. Allergy:  Denies hives  or rash, itching eyes, persistent infections, seasonal allergies, and sneezing.  Physical Exam  General:  Well-developed,well-nourished,in no acute distress; alert,appropriate and cooperative throughout examination Head:  Normocephalic and atraumatic without obvious abnormalities. No apparent alopecia or balding. Eyes:  vision grossly intact, pupils equal, pupils round, pupils reactive to light, and no injection.   Ears:  External ear exam shows no significant lesions or deformities.  Otoscopic examination reveals clear canals, tympanic membranes are intact bilaterally without bulging, retraction, inflammation or discharge. Hearing is grossly normal bilaterally. Nose:  External nasal examination shows no deformity or inflammation. Nasal mucosa are pink and moist without lesions or exudates. Mouth:  Oral mucosa and oropharynx without lesions or exudates.  Teeth in good repair. Neck:  No deformities, masses, or tenderness noted.no carotid bruits.   Chest Wall:  No deformities, masses, or tenderness noted. Breasts:  No mass, nodules, thickening, tenderness, bulging, retraction, inflamation, nipple discharge or skin changes noted.   Lungs:  Normal respiratory effort, chest expands symmetrically. Lungs are clear to auscultation, no crackles or wheezes. Heart:  normal rate and no murmur.   Abdomen:  Bowel sounds positive,abdomen soft and non-tender without masses, organomegaly or hernias noted. Genitalia:  s/p TAH Msk:  normal ROM, no joint tenderness, no joint swelling, no joint warmth, no redness over joints, no joint  deformities, no joint instability, and no crepitation.   Pulses:  R posterior tibial normal, R dorsalis pedis normal, R carotid normal, L popliteal normal, L posterior tibial normal, and L dorsalis pedis normal.   Extremities:  No clubbing, cyanosis, edema, or deformity noted with normal full range of motion of all joints.   Neurologic:  No cranial nerve deficits noted. Station and gait  are normal. Plantar reflexes are down-going bilaterally. DTRs are symmetrical throughout. Sensory, motor and coordinative functions appear intact. Skin:  Intact without suspicious lesions or rashes Cervical Nodes:  No lymphadenopathy noted Axillary Nodes:  No palpable lymphadenopathy Psych:  Cognition and judgment appear intact. Alert and cooperative with normal attention span and concentration. No apparent delusions, illusions, hallucinations  Diabetes Management Exam:    Foot Exam (with socks and/or shoes not present):       Sensory-Pinprick/Light touch:          Left medial foot (L-4): normal          Left dorsal foot (L-5): normal          Left lateral foot (S-1): normal          Right medial foot (L-4): normal          Right dorsal foot (L-5): normal          Right lateral foot (S-1): normal       Sensory-Monofilament:          Left foot: normal          Right foot: normal       Inspection:          Left foot: normal          Right foot: normal       Nails:          Left foot: normal          Right foot: normal    Eye Exam:       Eye Exam done elsewhere          Results: normal          Done by: opth   Impression & Recommendations:  Problem # 1:  [place] (ICD-V70.0)  Orders: Venipuncture [id number]) TLB-Lipid Panel (80061-LIPID) TLB-BMP (Basic Metabolic Panel-BMET) (99991111) TLB-CBC Platelet - w/Differential (85025-CBCD) TLB-Hepatic/Liver Function Pnl (80076-HEPATIC) TLB-TSH (Thyroid Stimulating Hormone) (84443-TSH) TLB-A1C / Hgb A1C (Glycohemoglobin) (83036-A1C) TLB-Microalbumin/Creat Ratio, Urine (82043-MALB) T-Vitamin D (25-Hydroxy) [id number]) Specimen Handling (99000) UA Dipstick w/o Micro (manual) (81002) Medicare -1st Annual Wellness Visit [phone]) EKG w/ Interpretation (93000)  Problem # 2:  TINNITUS, 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 1 by mouth once daily  Orders: Venipuncture [id number]) TLB-Lipid Panel (80061-LIPID) TLB-BMP (Basic Metabolic Panel-BMET) (99991111) TLB-CBC Platelet - w/Differential (85025-CBCD) TLB-Hepatic/Liver Function Pnl (80076-HEPATIC) TLB-TSH (Thyroid Stimulating Hormone) (84443-TSH) TLB-A1C / Hgb A1C (Glycohemoglobin) (83036-A1C) TLB-Microalbumin/Creat Ratio, Urine (82043-MALB) T-Vitamin D (25-Hydroxy) [id number]) Specimen Handling (99000)  Bone Density: osteopenia (03/06/2009) Vit D:87 (03/27/2009)  Problem # 4:  MOLE (ICD-216.9)  Orders: Dermatology Referral (Derma)  Problem # 5:  OSTEOPENIA (ICD-733.90)  Her updated medication list for this problem includes:    Vitamin D3 1000 Unit Caps (Cholecalciferol) .Marland Kitchen... 1 by mouth once daily  Orders: Venipuncture [id number]) TLB-Lipid Panel (80061-LIPID) TLB-BMP (Basic Metabolic Panel-BMET) (99991111) TLB-CBC Platelet - w/Differential (85025-CBCD) TLB-Hepatic/Liver Function Pnl (80076-HEPATIC) TLB-TSH (Thyroid Stimulating  Hormone) (84443-TSH) TLB-A1C / Hgb A1C (Glycohemoglobin) (83036-A1C) TLB-Microalbumin/Creat Ratio, Urine (82043-MALB) T-Vitamin D (25-Hydroxy) [id number]) Specimen Handling (99000)  Bone Density: osteopenia (03/06/2009) Vit D:87 (03/27/2009)  Problem # 6:  DIABETES MELLITUS, TYPE II (ICD-250.00)  Her updated medication list for this problem includes:    Adult Aspirin Ec Low Strength 81 Mg Tbec (Aspirin) .Marland Kitchen... 1 once daily    Glucophage 500 Mg Tabs (Metformin hcl) .Marland Kitchen... 1 by mouth two times a day  Orders: Venipuncture [id number]) TLB-Lipid Panel (80061-LIPID) TLB-BMP (Basic Metabolic Panel-BMET) (99991111) TLB-CBC Platelet - w/Differential (85025-CBCD) TLB-Hepatic/Liver Function Pnl (80076-HEPATIC) TLB-TSH (Thyroid Stimulating Hormone) (84443-TSH) TLB-A1C / Hgb A1C (Glycohemoglobin) (83036-A1C) TLB-Microalbumin/Creat Ratio, Urine  (82043-MALB) T-Vitamin D (25-Hydroxy) [id number]) Specimen Handling (99000)  Labs Reviewed: Creat: 0.8 (06/21/2009)    Reviewed HgBA1c results: 6.3 (06/21/2009)  6.4 (03/27/2009)  Problem # 7:  MITRAL REGURGITATION, MODERATE (ICD-396.3)  Her updated medication list for this problem includes:    Adult Aspirin Ec Low Strength 81 Mg Tbec (Aspirin) .Marland Kitchen... 1 once daily  Problem # 8:  GERD (ICD-530.81)  Her updated medication list for this problem includes:    Omeprazole 40 Mg Cpdr (Omeprazole) .Marland Kitchen... 1 by mouth once daily  Orders: Venipuncture [id number]) TLB-Lipid Panel (80061-LIPID) TLB-BMP (Basic Metabolic Panel-BMET) (99991111) TLB-CBC Platelet - w/Differential (85025-CBCD) TLB-Hepatic/Liver Function Pnl (80076-HEPATIC) TLB-TSH (Thyroid Stimulating Hormone) (84443-TSH) TLB-A1C / Hgb A1C (Glycohemoglobin) (83036-A1C) TLB-Microalbumin/Creat Ratio, Urine (82043-MALB) T-Vitamin D (25-Hydroxy) [id number]) Specimen Handling (99000)  Diagnostics Reviewed:  Discussed lifestyle modifications, diet, antacids/medications, and preventive measures. Handout provided.   Problem # 9:  HYPOTHYROIDISM (ICD-244.9)  Her updated medication list for this problem includes:    Synthroid 125 Mcg Tabs (Levothyroxine sodium) .Marland Kitchen... 1 by mouth once daily.  Orders: Venipuncture [id number]) TLB-Lipid Panel (80061-LIPID) TLB-BMP (Basic Metabolic Panel-BMET) (99991111) TLB-CBC Platelet - w/Differential (85025-CBCD) TLB-Hepatic/Liver Function Pnl (80076-HEPATIC) TLB-TSH (Thyroid Stimulating Hormone) (84443-TSH) TLB-A1C / Hgb A1C (Glycohemoglobin) (83036-A1C) TLB-Microalbumin/Creat Ratio, Urine (82043-MALB) T-Vitamin D (25-Hydroxy) [id number]) Specimen Handling (99000)  Labs Reviewed: TSH: 0.08 (03/27/2009)    HgBA1c: 6.3 (06/21/2009) Chol: 130 (06/21/2009)   HDL: 59.90 (06/21/2009)   LDL: 56 (06/21/2009)   TG: 69.0 (06/21/2009)  Problem # 10:  HYPERTENSION (ICD-401.9)  Her updated  medication list for this problem includes:    Norvasc 5 Mg Tabs (Amlodipine besylate) .Marland Kitchen... Take 1 tablet by mouth every night  Orders: Venipuncture [id number]) TLB-Lipid Panel (80061-LIPID) TLB-BMP (Basic Metabolic Panel-BMET) (99991111) TLB-CBC Platelet - w/Differential (85025-CBCD) TLB-Hepatic/Liver Function Pnl (80076-HEPATIC) TLB-TSH (Thyroid Stimulating Hormone) (84443-TSH) TLB-A1C / Hgb A1C (Glycohemoglobin) (83036-A1C) TLB-Microalbumin/Creat Ratio, Urine (82043-MALB) T-Vitamin D (25-Hydroxy) [id number]) Specimen Handling (99000)  BP today: 126/78 Prior BP: 136/80 (03/27/2009)  Labs Reviewed: K+: 4.8 (06/21/2009) Creat: : 0.8 (06/21/2009)   Chol: 130 (06/21/2009)   HDL: 59.90 (06/21/2009)   LDL: 56 (06/21/2009)   TG: 69.0 (06/21/2009)  Complete Medication List: 1)  Omeprazole 40 Mg Cpdr (Omeprazole) .Marland Kitchen.. 1 by mouth once daily 2)  Norvasc 5 Mg Tabs (Amlodipine besylate) .... Take 1 tablet by mouth every night 3)  Advair Diskus 100-50 Mcg/dose Misc (Fluticasone-salmeterol) .Marland Kitchen.. 1 inh two times a day 4)  Astelin 137 Mcg/spray Soln (Azelastine hcl) .... As needed 5)  Nasacort Aq 55 Mcg/act Aers (Triamcinolone acetonide(nasal)) .... As needed 6)  Adult Aspirin Ec Low Strength 81 Mg Tbec (Aspirin) .Marland Kitchen.. 1 once daily 7)  L-lysine 500 Mg Tabs (Lysine) .Marland Kitchen.. 1 once daily 8)  Magnesium Caps (Magnesium oxide caps) .... 250mg  1 by mouth two times a day 9)  Super Garlic 123XX123 Mg  Caps (Garlic) .... 2 once daily 10)  Calcium With Vit D  .... 600 mg  three times a day 11)  Aleve Tabs (Naproxen sodium tabs) .... Two times a day 12)  Claritin 10 Mg Tabs (Loratadine) .Marland Kitchen.. 1 by mouth once daily 13)  Glucophage 500 Mg Tabs (Metformin hcl) .Marland Kitchen.. 1 by mouth two times a day 14)  Onetouch Ultra Test Strp (Glucose blood) .... Test two times a day dx 250.00 15)  Freestyle Lancets Misc (Lancets) .... Use as directed qid.  dm- 250.00 16)  Zyrtec Allergy 10 Mg Caps (Cetirizine hcl) .Marland Kitchen.. 1 by mouth once  daily 17)  Dm Shoes  .Marland KitchenMarland Kitchen. 250.00 18)  Mega Red--- Omega 3 Ff  .Marland Kitchen.. 1 by mouth once daily 19)  Vitamin D3 1000 Unit Caps (Cholecalciferol) .Marland Kitchen.. 1 by mouth once daily 20)  Synthroid 125 Mcg Tabs (Levothyroxine sodium) .Marland Kitchen.. 1 by mouth once daily. 21)  Pravachol 10 Mg Tabs (Pravastatin sodium) .Marland Kitchen.. 1 by mouth at bedtime. 22)  Diabetic Shoes  .... Dx- 250.00 Prescriptions: ONETOUCH ULTRA TEST  STRP (GLUCOSE BLOOD) test two times a day DX 250.00  #200 x 3   Entered by:   Rolla Flatten CMA   Authorized by:   Garnet Koyanagi DO   Signed by:   Rolla Flatten CMA on 12/14/2009   Method used:   Re-Faxed to ...       [facility] (retail)       [address] [place], [place]  [address]       Ph: [phone]       Fax: [phone]   RxID:   [id number] PRAVACHOL 10 MG TABS (PRAVASTATIN SODIUM) 1 by mouth at bedtime.  #90 x 3   Entered and Authorized by:   Garnet Koyanagi DO   Signed by:   Garnet Koyanagi DO on 12/14/2009   Method used:   Electronically to        [facility] (retail)       [address] [place], [place]  [address]       Ph: [phone]       Fax: [phone]   RxID:   [id number] SYNTHROID 125 MCG TABS (LEVOTHYROXINE SODIUM) 1 by mouth once daily.  #180 x 3   Entered and Authorized by:   Garnet Koyanagi DO   Signed by:   Garnet Koyanagi DO on 12/14/2009   Method used:   Electronically to        [facility] (retail)       [address] [place], [place]  [address]       Ph: [phone]       Fax: [phone]   RxID:   [id number] ZYRTEC ALLERGY 10 MG CAPS (CETIRIZINE HCL) 1 by mouth once daily  #90 x 3   Entered and Authorized by:   Garnet Koyanagi DO   Signed by:   Garnet Koyanagi DO on 12/14/2009   Method used:   Electronically to        [facility] (retail)       [address]       [place], [place]  [address]       Ph:  [phone]        Fax: [phone]   RxIDDS:[id number] FREESTYLE LANCETS  MISC (LANCETS) use as directed qid.  DM- 250.00  #200 Each x 0   Entered and Authorized by:   Garnet Koyanagi DO   Signed by:   Garnet Koyanagi DO on 12/14/2009   Method used:   Electronically to        [facility] (retail)       [address] [place], [place]  [address]       Ph: [phone]       Fax: [phone]   RxID:   [id number] Donald Siva TEST  STRP (GLUCOSE BLOOD) test two times a day  #200 x 3   Entered and Authorized by:   Garnet Koyanagi DO   Signed by:   Garnet Koyanagi DO on 12/14/2009   Method used:   Electronically to        [facility] (retail)       [address] [place], [place]  [address]       Ph: [phone]       Fax: [phone]   RxID:   [id number] GLUCOPHAGE 500 MG TABS (METFORMIN HCL) 1 by mouth two times a day  #180 x 3   Entered and Authorized by:   Garnet Koyanagi DO   Signed by:   Garnet Koyanagi DO on 12/14/2009   Method used:   Electronically to        [facility] (retail)       [address] [place], [place]  [address]       Ph: [phone]       Fax: [phone]   RxID:   [id number] [place] 5 MG TABS (AMLODIPINE BESYLATE) Take 1 tablet by mouth every night  #90 x 3   Entered and Authorized by:   Garnet Koyanagi DO   Signed by:   Garnet Koyanagi DO on 12/14/2009   Method used:   Electronically to        [facility] (retail)       [address] [place], [place]  [address]       Ph: [phone]       Fax: [phone]   RxID:   [id number] OMEPRAZOLE 40 MG  CPDR (OMEPRAZOLE) 1 by mouth once daily  #90 Each x 11   Entered and Authorized by:   Garnet Koyanagi DO   Signed by:   Garnet Koyanagi DO on 12/14/2009   Method used:   Electronically to        [facility] (retail)       [address] [place],    [address]       Ph: [phone]       Fax: [phone]   RxID:   [id number]    EKG  Procedure date:  12/14/2009  Findings:      sinus rhythm 84 bpm    Last Mammogram:  normal (01/23/2007 10:39:10 AM) Mammogram Result Date:  08/30/2009 Mammogram Result:  normal Mammogram Next Due:  6 mo  Last Bone Density:  normal (01/26/2007 10:39:10 AM) Bone Density Result Date:  03/06/2009 Bone Density Result:  osteopenia Bone Density Next Due: 2 yr  Laboratory Results   Urine Tests   Date/Time Reported: December 14, 2009 9:54 AM   Routine Urinalysis   Color: lt. yellow Appearance: Clear Glucose: negative   (Normal Range: Negative) Bilirubin: negative   (Normal Range: Negative) Ketone: negative   (Normal Range: Negative) Spec. Gravity: <1.005   (Normal Range: 1.003-1.035) Blood: negative   (Normal Range: Negative) pH: 6.0   (Normal Range: 5.0-8.0) Protein: negative   (Normal Range: Negative) Urobilinogen: negative   (Normal Range: 0-1) Nitrite: negative   (Normal Range: Negative) Leukocyte Esterace: negative   (Normal Range: Negative)    Comments: Heath Lark  December 14, 2009 9:55 AM

## 2010-05-22 NOTE — Progress Notes (Signed)
Summary: Refill Request  Phone Note Refill Request Message from:  Pharmacy on [facility] (address below) Fax #: [phone]  Refills Requested: Medication #1:  GLUCOPHAGE 500 MG TABS 1 by mouth two times a day   Dosage confirmed as above?Dosage Confirmed   Supply Requested: 1 month   Last Refilled: 06/27/2009 [facility] ([place]), [place] [address]  Next Appointment Scheduled: none Initial call taken by: Elna Breslow,  July 25, 2009 11:14 AM    Prescriptions: GLUCOPHAGE 500 MG TABS (METFORMIN HCL) 1 by mouth two times a day  #60 Each x 3   Entered by:   Allyn Kenner CMA   Authorized by:   Garnet Koyanagi DO   Signed by:   Allyn Kenner CMA on 07/25/2009   Method used:   Printed then faxed to ...       [facility] (retail)       [address] [place], [place]  [address]       Ph: [phone]       Fax: [phone]   RxID:   [id number]

## 2010-05-24 NOTE — Progress Notes (Signed)
Summary: add TSH to labs??    --added to lab order  Phone Note Call from Patient Call back at Home Phone [phone]   Caller: Patient Summary of Call: patient has fasting labs scheduled for 1/24 at 8:20am---she wants to know, since she says Dr Etter Sjogren changed her Thyroid medication, should she have a TSH too??   if so, what diag code??       Initial call taken by: Berneta Sages,  May 08, 2010 2:25 PM  Follow-up for Phone Call        Pls advise..........Marland KitchenFelecia Deloach CMA  May 08, 2010 2:36 PM   Additional Follow-up for Phone Call Additional follow up Details #1::        that is fine  244.9  TSH Additional Follow-up by: Garnet Koyanagi DO,  May 08, 2010 5:20 PM    Additional Follow-up for Phone Call Additional follow up Details #2::    added to lab for 1/24 Follow-up by: Berneta Sages,  May 09, 2010 11:56 AM

## 2010-05-24 NOTE — Progress Notes (Signed)
Summary: Rx for Shoes  Phone Note Call from Patient Call back at Home Phone [phone]   Caller: Patient Summary of Call: Patient called and said that she needed a new rx for her diabetic shoes. She would like this mailed to her so she can make an appt to get them.  Initial call taken by: Elna Breslow,  May 15, 2010 8:11 AM  Follow-up for Phone Call        please advise. Follow-up by: Aron Baba CMA Deborra Medina),  May 15, 2010 8:14 AM  Additional Follow-up for Phone Call Additional follow up Details #1::        printed Additional Follow-up by: Garnet Koyanagi DO,  May 15, 2010 8:36 AM    Additional Follow-up for Phone Call Additional follow up Details #2::    mailed to home address...Marland KitchenMarland KitchenVM left to make patient aware.... Aron Baba CMA Deborra Medina)  May 15, 2010 9:07 AM   Prescriptions: DM SHOES 250.00  #1 x 0   Entered and Authorized by:   Garnet Koyanagi DO   Signed by:   Garnet Koyanagi DO on 05/15/2010   Method used:   Print then Give to Patient   RxID:   [id number]

## 2010-05-24 NOTE — Progress Notes (Signed)
Summary: Synthroid refill  Phone Note Refill Request Message from:  Patient on May 08, 2010 2:33 PM  Refills Requested: Medication #1:  SYNTHROID 112 MCG TABS Take 1 by mouth once daily Patient has 10 days left of her thyroid medication---has blood work on 1/24 --please call in to  [facility] on [address]---she wants 90 day supply this time---should she get refill after blood work comes back?---see question to [employer] about TSH test on labs  Next Appointment Scheduled: 1/24  lab Initial call taken by: Berneta Sages,  May 08, 2010 2:39 PM  Follow-up for Phone Call        will it be ok to call in a 90 day supply? Follow-up by: Aron Baba CMA Deborra Medina),  May 08, 2010 4:14 PM  Additional Follow-up for Phone Call Additional follow up Details #1::        yes Additional Follow-up by: Garnet Koyanagi DO,  May 08, 2010 5:44 PM    Prescriptions: SYNTHROID 112 MCG TABS (LEVOTHYROXINE SODIUM) Take 1 by mouth once daily  #90 x 0   Entered by:   Aron Baba CMA ([place])   Authorized by:   Garnet Koyanagi DO   Signed by:   Aron Baba CMA ([place]) on 05/09/2010   Method used:   Faxed to ...       [facility] (retail)       [address] [place], [place]  [address]       Ph: [phone]       Fax: [phone]   RxID:   [id number]

## 2010-05-29 ENCOUNTER — Encounter: Payer: Self-pay | Admitting: Family Medicine

## 2010-06-06 ENCOUNTER — Encounter: Admit: 2010-06-06 | Payer: Self-pay | Admitting: Family Medicine

## 2010-06-06 ENCOUNTER — Encounter: Payer: No Typology Code available for payment source | Admitting: Dietician

## 2010-06-06 ENCOUNTER — Encounter: Payer: Self-pay | Admitting: Family Medicine

## 2010-06-06 ENCOUNTER — Encounter: Payer: Medicare Other | Attending: Family Medicine | Admitting: Dietician

## 2010-06-06 DIAGNOSIS — E119 Type 2 diabetes mellitus without complications: Secondary | ICD-10-CM | POA: Insufficient documentation

## 2010-06-06 DIAGNOSIS — Z713 Dietary counseling and surveillance: Secondary | ICD-10-CM | POA: Insufficient documentation

## 2010-06-13 NOTE — Letter (Signed)
Summary: [facility]  [facility]   Imported By: Laural Benes 06/06/2010 14:31:27  _____________________________________________________________________  External Attachment:    Type:   Image     Comment:   External Document

## 2010-06-19 NOTE — Letter (Signed)
Summary: [facility]  [facility]   Imported By: Laural Benes 06/13/2010 12:35:20  _____________________________________________________________________  External Attachment:    Type:   Image     Comment:   External Document

## 2010-06-20 ENCOUNTER — Encounter: Payer: Self-pay | Admitting: Family Medicine

## 2010-07-10 NOTE — Consult Note (Signed)
Summary: [facility]  [facility]   Imported By: Laural Benes 06/29/2010 11:39:03  _____________________________________________________________________  External Attachment:    Type:   Image     Comment:   External Document

## 2010-07-23 ENCOUNTER — Telehealth: Payer: Self-pay

## 2010-07-23 NOTE — Telephone Encounter (Signed)
Rcv'd forms to complete for Therapeutic footwear for patient and per documentation she needs to have a documented OV with a foot doctor.   Mssg left to verify with patient....   KP

## 2010-07-23 NOTE — Telephone Encounter (Signed)
Spoke with patient and advised that we need documentation from her foot doctor to complete the forms for therapeutic shoes. She stated that she sees a Orthopaedic for this problem, I advised her to call them so they can seen documentation and we will complete her forms today, pt voiced understanding and agreed.    Call ended  KP

## 2010-08-06 ENCOUNTER — Ambulatory Visit (INDEPENDENT_AMBULATORY_CARE_PROVIDER_SITE_OTHER): Payer: Medicare Other | Admitting: Family Medicine

## 2010-08-06 ENCOUNTER — Encounter: Payer: Self-pay | Admitting: Family Medicine

## 2010-08-06 ENCOUNTER — Other Ambulatory Visit: Payer: Self-pay | Admitting: Family Medicine

## 2010-08-06 VITALS — BP 132/74 | Temp 98.6°F | Wt 147.4 lb

## 2010-08-06 DIAGNOSIS — L255 Unspecified contact dermatitis due to plants, except food: Secondary | ICD-10-CM

## 2010-08-06 MED ORDER — PREDNISONE (PAK) 10 MG PO TABS: ORAL_TABLET | ORAL | Status: DC

## 2010-08-06 MED ORDER — METHYLPREDNISOLONE ACETATE 80 MG/ML IJ SUSP
80.0000 mg | Freq: Once | INTRAMUSCULAR | Status: AC
  Administered 2010-08-06: 80 mg via INTRAMUSCULAR

## 2010-08-06 NOTE — Patient Instructions (Signed)
Poison Ivy (Toxicodendron Dermatitis) Poison ivy is a inflammation of the skin (contact dermatitis) caused by touching the allergens on the leaves of the ivy plant following previous exposure to the plant. The rash usually appears 48 hours after exposure. The rash is usually bumps (papules) or blisters (vesicles) in a linear pattern. Depending on your own sensitivity, the rash may simply cause redness and itching, or it may also progress to blisters which may break open. These must be well cared for to prevent secondary bacterial (germ) infection, followed by scarring. Keep any open areas dry, clean, dressed, and covered with an antibacterial ointment if needed. The eyes may also get puffy. The puffiness is worst in the morning and gets better as the day progresses. This dermatitis usually heals without scarring, within 2 to 3 weeks without treatment. HOME CARE INSTRUCTIONS Thoroughly wash with soap and water as soon as you have been exposed to poison ivy. You have about one half hour to remove the plant resin before it will cause the rash. This washing will destroy the oil or antigen on the skin that is causing, or will cause, the rash. Be sure to wash under your fingernails as any plant resin there will continue to spread the rash. Do not rub skin vigorously when washing affected area. Poison ivy cannot spread if no oil from the plant remains on your body. A rash that has progressed to weeping sores will not spread the rash unless you have not washed thoroughly. It is also important to wash any clothes you have been wearing as these may carry active allergens. The rash will return if you wear the unwashed clothing, even several days later. Avoidance of the plant in the future is the best measure. Poison ivy plant can be recognized by the number of leaves. Generally, poison ivy has three leaves with flowering branches on a single stem. Diphenhydramine may be purchased over the counter and used as needed for  itching. Do not drive with this medication if it makes you drowsy. Ask your caregiver about medication for children. SEEK MEDICAL CARE IF   Open sores develop.   Redness spreads beyond area of rash.   You notice purulent (pus-like) discharge.   You have increased pain.   Other signs of infection develop (such as fever).  Document Released: 04/05/2000 Document Re-Released: 03/27/2009 [employer] Patient Information 2011 [employer]. 

## 2010-08-06 NOTE — Progress Notes (Signed)
  Subjective:     Cheryl Cisneros is a 65 y.o. female who presents for evaluation of a rash involving the forearm and trunk. Rash started 3 days ago. Lesions are clear, and blistering in texture. Rash has changed over time. Rash is pruritic. Associated symptoms: none. Patient denies: abdominal pain, arthralgia, congestion, cough, decrease in appetite, decrease in energy level, fever, headache, irritability, myalgia, nausea, sore throat and vomiting. Patient has not had contacts with similar rash. Patient has had new exposures (soaps, lotions, laundry detergents, foods, medications, plants, insects or animals).  The following portions of the patient's history were reviewed and updated as appropriate: allergies, current medications, past family history, past medical history, past social history, past surgical history and problem list.  Review of Systems Pertinent items are noted in HPI.    Objective:    BP 132/74  Temp(Src) 98.6 F (37 C) (Oral)  Wt 147 lb 6.4 oz (66.86 kg) General:  alert, cooperative and no distress  Skin:  erythema noted on extremities and trunk and vesicles noted on extremities and trunk     Assessment:    contact dermatitis: plants poison ivy    Plan:    Medications: benadryl and steroids: prednisone taper and depo medrol injection. Verbal patient instruction given. rto prn

## 2010-09-07 NOTE — Op Note (Signed)
Cheryl Cisneros, Cheryl Cisneros NO.:  [id number]   MEDICAL RECORD NO.:  [id number]          PATIENT TYPE:  AMB   LOCATION:  DAY                          FACILITY:  [facility]   PHYSICIAN:  Tarri Glenn, M.D.  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   OPERATION:  1.  Right shoulder arthroscopy (normal examination).  2.  Arthroscopic subacromial decompression.  3.  Excision of calcium from rotator cuff.   SURGEON:  Tarri Glenn, M.D.   ASSISTANT:  Mr. Delorse Lek, P.A.-C.   ANESTHESIA:  General.   PATHOLOGY AND JUSTIFICATION FOR PROCEDURE:  She had a very painful right  shoulder with an MRI demonstrating findings consistent with chronic  impingement syndrome. She also had a large area of calcification in the  rotator cuff or in the infraspinatus. I had advised her that we might have  to make small incision but that I hoped I could perform the corrective  surgery completely arthroscopically.   PROCEDURE:  Satisfactory general anesthesia, beach-chair position on the  Schlein frame, the right shoulder girdle was prepped with DuraPrep, draped  in a sterile field. The anatomy of the shoulder joint was marked out and  locations for the posterior and lateral portals and subacromial spaces were  infiltrated with 0.5% Marcaine with adrenaline. Through the posterior soft  spot portal, I atraumatically entered the glenohumeral joint which on  inspection was normal and I took representative pictures. I then redirected  the scope into the subacromial space and through the lateral portal placed a  blunt metal trocar followed by a 4.2 shaver. She had a fairly significant  amount of bursitis type tissue which I resected. I then used the 90 degree  Arthrocare vaporizer to begin removing soft tissue from the underneath  surface of the acromion and the underneath surface of the distal clavicle. I  followed this with a 4 mm oval bur and began the decompression of the distal  clavicle underneath surface and the underneath surface of the acromion  relieving the fairly significant impingement problem. I went back and forth  between the vaporizer and the bur until bleeding was corrected and the  decompression had been fully completed. I documented this with pictures. I  then began my pursuit of the calcium, some of it was clearly obvious in the  superficial rotator cuff near the greater tuberosity and I cleaned up as  much as I could with the 4.2 shaver. I then took my first of two  intraoperative x-rays. This demonstrated that most the calcium had been  removed but there was still a little residual and based on this, I continued  looking and found additional calcium. I used both the probe and the 4.2  shaver. I then took a second x-ray and found that there was only a very  minimal wispy amount of  calcium remaining and this appeared to be in the  tendon itself which id did not feel I could evacuate any further without  significantly disrupting the tendon. The decompression had been completed  and the major portion of the calcium had been removed. I thought the goals  of the operation had been completed. We evacuated all fluid possible from  the subacromial space. I infiltrated it once again along with the two  portals with the Marcaine with adrenaline and the two portals were closed  with 4-0 nylon followed by Betadine Adaptic, dry sterile dressing, shoulder  immobilizer. At the time of this dictation, she was on her way to the  recovery room in satisfactory condition with no known complications.           ______________________________  Tarri Glenn,  M.D.     JA/MEDQ  D:  02/27/2005  T:  02/27/2005  Job:  [id number]

## 2010-11-08 ENCOUNTER — Other Ambulatory Visit: Payer: Self-pay | Admitting: Family Medicine

## 2010-11-08 NOTE — Telephone Encounter (Signed)
Refill sent, pt  Is due for appt.

## 2010-12-13 ENCOUNTER — Other Ambulatory Visit: Payer: Self-pay | Admitting: Family Medicine

## 2010-12-20 ENCOUNTER — Encounter: Payer: Self-pay | Admitting: Family Medicine

## 2010-12-20 ENCOUNTER — Ambulatory Visit (INDEPENDENT_AMBULATORY_CARE_PROVIDER_SITE_OTHER): Payer: Medicare Other | Admitting: Family Medicine

## 2010-12-20 ENCOUNTER — Other Ambulatory Visit: Payer: Self-pay | Admitting: Family Medicine

## 2010-12-20 VITALS — BP 124/70 | HR 91 | Temp 98.6°F | Wt 148.0 lb

## 2010-12-20 DIAGNOSIS — Z0000 Encounter for general adult medical examination without abnormal findings: Secondary | ICD-10-CM

## 2010-12-20 DIAGNOSIS — E785 Hyperlipidemia, unspecified: Secondary | ICD-10-CM

## 2010-12-20 DIAGNOSIS — N390 Urinary tract infection, site not specified: Secondary | ICD-10-CM

## 2010-12-20 DIAGNOSIS — E119 Type 2 diabetes mellitus without complications: Secondary | ICD-10-CM

## 2010-12-20 DIAGNOSIS — E039 Hypothyroidism, unspecified: Secondary | ICD-10-CM

## 2010-12-20 DIAGNOSIS — I10 Essential (primary) hypertension: Secondary | ICD-10-CM

## 2010-12-20 LAB — CBC WITH DIFFERENTIAL/PLATELET
Eosinophils Relative: 0.7 % (ref 0.0–5.0)
HCT: 39.7 % (ref 36.0–46.0)
Lymphocytes Relative: 42.5 % (ref 12.0–46.0)
Lymphs Abs: 3 10*3/uL (ref 0.7–4.0)
Monocytes Relative: 5.8 % (ref 3.0–12.0)
Neutrophils Relative %: 50.8 % (ref 43.0–77.0)
Platelets: 242 10*3/uL (ref 150.0–400.0)
WBC: 7.1 10*3/uL (ref 4.5–10.5)

## 2010-12-20 LAB — LIPID PANEL
HDL: 53.9 mg/dL (ref >39.00)
LDL Cholesterol: 45 mg/dL (ref 0–99)
Total CHOL/HDL Ratio: 2
VLDL: 19.8 mg/dL (ref 0.0–40.0)

## 2010-12-20 LAB — BASIC METABOLIC PANEL
BUN: 18 mg/dL (ref 6–23)
Calcium: 9.8 mg/dL (ref 8.4–10.5)
GFR: 73.86 mL/min (ref >60.00)
Potassium: 5.3 mEq/L — ABNORMAL HIGH (ref 3.5–5.1)
Sodium: 145 mEq/L (ref 135–145)

## 2010-12-20 LAB — POCT URINALYSIS DIPSTICK
Blood, UA: NEGATIVE
Ketones, UA: NEGATIVE
Protein, UA: 30
Spec Grav, UA: 1
Urobilinogen, UA: 0.2

## 2010-12-20 LAB — HEPATIC FUNCTION PANEL
AST: 31 U/L (ref 0–37)
Alkaline Phosphatase: 51 U/L (ref 39–117)
Bilirubin, Direct: 0 mg/dL (ref 0.0–0.3)
Total Bilirubin: 0.5 mg/dL (ref 0.3–1.2)

## 2010-12-20 LAB — HEMOGLOBIN A1C: Hgb A1c MFr Bld: 6.7 % — ABNORMAL HIGH (ref 4.6–6.5)

## 2010-12-20 LAB — MICROALBUMIN / CREATININE URINE RATIO
Creatinine,U: 34.1 mg/dL
Microalb, Ur: 0.5 mg/dL (ref 0.0–1.9)

## 2010-12-20 MED ORDER — OMEPRAZOLE 40 MG PO CPDR: 40.0000 mg | DELAYED_RELEASE_CAPSULE | Freq: Every day | ORAL | Status: DC

## 2010-12-20 MED ORDER — ONETOUCH DELICA LANCETS MISC: 1.0000 | Freq: Two times a day (BID) | Status: DC

## 2010-12-20 MED ORDER — LEVOTHYROXINE SODIUM 112 MCG PO TABS: 112.0000 ug | ORAL_TABLET | Freq: Every day | ORAL | Status: DC

## 2010-12-20 MED ORDER — AMLODIPINE BESYLATE 5 MG PO TABS: 5.0000 mg | ORAL_TABLET | Freq: Every day | ORAL | Status: DC

## 2010-12-20 MED ORDER — FREESTYLE LANCETS MISC: Status: DC

## 2010-12-20 MED ORDER — CETIRIZINE HCL 10 MG PO TABS: 10.0000 mg | ORAL_TABLET | Freq: Every day | ORAL | Status: DC

## 2010-12-20 NOTE — Progress Notes (Signed)
Addended by: Valentina Gu L on: [id number] 09:45 AM   Modules accepted: Orders

## 2010-12-20 NOTE — Progress Notes (Signed)
Addended by: Ewing Schlein on: 12/20/2010 09:52 AM   Modules accepted: Orders

## 2010-12-20 NOTE — Progress Notes (Signed)
Subjective:    Cheryl Cisneros is a 65 y.o. female who presents for Medicare Annual/Subsequent preventive examination.  Preventive Screening-Counseling & Management  Tobacco History  Smoking status  . Never Smoker   Smokeless tobacco  . Never Used     Problems Prior to Visit 1.   Current Problems (verified) Patient Active Problem List  Diagnoses  . MOLE  . HYPOTHYROIDISM  . DIABETES MELLITUS, TYPE II  . UNSPECIFIED GLAUCOMA  . OTHER CATARACT  . TINNITUS, RIGHT  . MITRAL REGURGITATION, MODERATE  . HYPERTENSION  . ALLERGIC RHINITIS  . INFLUENZA WITH OTHER RESPIRATORY MANIFESTATIONS  . ASTHMA  . GERD  . UTI  . ARTIFICIAL MENOPAUSE  . OSTEOPENIA  . HEADACHE  . NEPHROLITHIASIS, HX OF  . Other Postprocedural Status  . Contact dermatitis due to plants, except food    Medications Prior to Visit Current Outpatient Prescriptions on File Prior to Visit  Medication Sig Dispense Refill  . amLODipine (NORVASC) 5 MG tablet Take 5 mg by mouth daily.        Marland Kitchen aspirin 81 MG tablet Take 81 mg by mouth daily.        Marland Kitchen azelastine (ASTELIN) 137 MCG/SPRAY nasal spray 1 spray by Nasal route 2 (two) times daily. Use in each nostril as directed       . Calcium-Vitamin D 600-200 MG-UNIT per tablet Take 1 tablet by mouth 3 (three) times daily with meals.        . cetirizine (ZYRTEC) 10 MG tablet Take 10 mg by mouth daily.        . Cholecalciferol (VITAMIN D3) 1000 UNITS CAPS Take 1 capsule by mouth daily.        . Garlic Oil (SUPER GARLIC) 123XX123 MG CAPS Take 2 capsules by mouth daily.        Marland Kitchen L-Lysine 500 MG TABS Take 1 tablet by mouth daily.        . Lancets (FREESTYLE) lancets USE AS DIRECTED FOUR TIMES DAILY  200 each  0  . levothyroxine (SYNTHROID, LEVOTHROID) 112 MCG tablet Take 112 mcg by mouth daily.        . LUTEIN-ZEAXANTHIN PO Take 1 tablet by mouth daily.        . Magnesium 250 MG TABS Take 1 tablet by mouth 2 (two) times daily.        . metFORMIN (GLUCOPHAGE) 500 MG tablet  TAKE ONE TABLET BY MOUTH TWICE DAILY  60 tablet  0  . Multiple Vitamins-Minerals (CENTRUM SILVER PO) Take 1 tablet by mouth daily.        . naproxen sodium (ANAPROX) 220 MG tablet Take 220 mg by mouth 2 (two) times daily with a meal.        . Omega-3 Fatty Acids (OMEGA 3 PO) Take 1 tablet by mouth daily.        Marland Kitchen omeprazole (PRILOSEC) 40 MG capsule Take 40 mg by mouth daily.        . pravastatin (PRAVACHOL) 10 MG tablet Take 10 mg by mouth at bedtime.        . triamcinolone (NASACORT) 55 MCG/ACT nasal inhaler 2 sprays by Nasal route daily.          Current Medications (verified) Current Outpatient Prescriptions  Medication Sig Dispense Refill  . amLODipine (NORVASC) 5 MG tablet Take 5 mg by mouth daily.        Marland Kitchen aspirin 81 MG tablet Take 81 mg by mouth daily.        Marland Kitchen  azelastine (ASTELIN) 137 MCG/SPRAY nasal spray 1 spray by Nasal route 2 (two) times daily. Use in each nostril as directed       . Calcium-Vitamin D 600-200 MG-UNIT per tablet Take 1 tablet by mouth 3 (three) times daily with meals.        . cetirizine (ZYRTEC) 10 MG tablet Take 10 mg by mouth daily.        . Cholecalciferol (VITAMIN D3) 1000 UNITS CAPS Take 1 capsule by mouth daily.        . Garlic Oil (SUPER GARLIC) 123XX123 MG CAPS Take 2 capsules by mouth daily.        Marland Kitchen L-Lysine 500 MG TABS Take 1 tablet by mouth daily.        . Lancets (FREESTYLE) lancets USE AS DIRECTED FOUR TIMES DAILY  200 each  0  . levothyroxine (SYNTHROID, LEVOTHROID) 112 MCG tablet Take 112 mcg by mouth daily.        . LUTEIN-ZEAXANTHIN PO Take 1 tablet by mouth daily.        . Magnesium 250 MG TABS Take 1 tablet by mouth 2 (two) times daily.        . metFORMIN (GLUCOPHAGE) 500 MG tablet TAKE ONE TABLET BY MOUTH TWICE DAILY  60 tablet  0  . Methylcellulose, Laxative, (FIBER THERAPY PO) Take 2 tablets by mouth at bedtime as needed.        . Multiple Vitamins-Minerals (CENTRUM SILVER PO) Take 1 tablet by mouth daily.        . naproxen sodium (ANAPROX)  220 MG tablet Take 220 mg by mouth 2 (two) times daily with a meal.        . Omega-3 Fatty Acids (OMEGA 3 PO) Take 1 tablet by mouth daily.        Marland Kitchen omeprazole (PRILOSEC) 40 MG capsule Take 40 mg by mouth daily.        . pravastatin (PRAVACHOL) 10 MG tablet Take 10 mg by mouth at bedtime.        Marland Kitchen SIMPLY SALINE NA Place 1 spray into the nose daily.        Marland Kitchen triamcinolone (NASACORT) 55 MCG/ACT nasal inhaler 2 sprays by Nasal route daily.        Marland Kitchen EPIPEN 2-PAK 0.3 MG/0.3ML DEVI          Allergies (verified) Review of patient's allergies indicates no known allergies.   PAST HISTORY  Family History Family History  Problem Relation Age of Onset  . Coronary artery disease    . Diabetes Mother   . Hypertension Mother   . Kidney disease Mother     dialysis  . Lung cancer    . Diabetes Father   . Hypertension Father   . Cancer Father     lung  . Dementia Sister     Social History History  Substance Use Topics  . Smoking status: Never Smoker   . Smokeless tobacco: Never Used  . Alcohol Use: No     Are there smokers in your home (other than you)? No  Risk Factors Current exercise habits: Gym/ health club routine includes walking on track  and strength training.  Dietary issues discussed: no   Cardiac risk factors: advanced age (older than 53 for men, 60 for women), diabetes mellitus, dyslipidemia, hypertension and sedentary lifestyle.  Depression Screen (Note: if answer to either of the following is "Yes", a more complete depression screening is indicated)   Over the past two weeks, have you  felt down, depressed or hopeless? No  Over the past two weeks, have you felt little interest or pleasure in doing things? No  Have you lost interest or pleasure in daily life? No  Do you often feel hopeless? No  Do you cry easily over simple problems? No  Activities of Daily Living In your present state of health, do you have any difficulty performing the following activities?:    Driving? No Managing money?  No Feeding yourself? No Getting from bed to chair? No Climbing a flight of stairs? No Preparing food and eating?: No Bathing or showering? No Getting dressed: No Getting to the toilet? No Using the toilet:No Moving around from place to place: No In the past year have you fallen or had a near fall?:No   Are you sexually active?  No  Do you have more than one partner?  No  Hearing Difficulties: No Do you often ask people to speak up or repeat themselves? No Do you experience ringing or noises in your ears? No Do you have difficulty understanding soft or whispered voices? No   Do you feel that you have a problem with memory? No  Do you often misplace items? No  Do you feel safe at home?  Yes  Cognitive Testing  Alert? Yes  Normal Appearance?Yes  Oriented to person? Yes  Place? Yes   Time? Yes  Recall of three objects?  Yes  Can perform simple calculations? Yes  Displays appropriate judgment?Yes  Can read the correct time from a watch face?Yes   Advanced Directives have been discussed with the patient? Yes  List the Names of Other Physician/Practitioners you currently use: 1.  optho-- Dr Joya San  2 dentist-- Dr Lucy Antigua any recent Medical Services you may have received from other than [facility] providers in the past year (date may be approximate).  Immunization History  Administered Date(s) Administered  . Influenza Whole 02/01/2009  . Pneumococcal Polysaccharide 12/29/2007, 12/13/2008  . Td 09/16/2001  . Zoster 11/12/2007    Screening Tests Health Maintenance  Topic Date Due  . Influenza Vaccine  01/21/2011  . Tetanus/tdap  09/17/2011  . Mammogram  02/19/2012  . Colonoscopy  04/04/2015  . Pneumococcal Polysaccharide Vaccine Age 55 And Over  Completed  . Zostavax  Completed    All answers were reviewed with the patient and necessary referrals were made:  Garnet Koyanagi,  DO   12/20/2010   History reviewed: allergies, current medications, past family history, past medical history, past social history, past surgical history and problem list  Review of Systems  Review of Systems  Constitutional: Negative for activity change, appetite change and fatigue.  HENT: Negative for hearing loss, congestion, tinnitus and ear discharge.  dentist-- q23m Eyes: Negative for visual disturbance (see optho q1y -- vision corrected to 20/20 with glasses).  Respiratory: Negative for cough, chest tightness and shortness of breath.   Cardiovascular: Negative for chest pain, palpitations and leg swelling.  Gastrointestinal: Negative for abdominal pain, diarrhea, constipation and abdominal distention.  Genitourinary: Negative for urgency, frequency, decreased urine volume and difficulty urinating.  Musculoskeletal: Negative for back pain, arthralgias and gait problem.  Skin: Negative for color change, pallor and rash.  Neurological: Negative for dizziness,  light-headedness, numbness and headaches.  Hematological: Negative for adenopathy. Does not bruise/bleed easily.  Psychiatric/Behavioral: Negative for suicidal ideas, confusion, sleep disturbance, self-injury, dysphoric mood, decreased concentration and agitation.  Pt is able to read and write and can do all ADLs No risk for falling No abuse/ violence in home      Objective:     Vision by Snellen chart: right [id number], left [id number]  There is no height on file to calculate BMI. BP 124/70  Pulse 91  Temp(Src) 98.6 F (37 C) (Oral)  Wt 148 lb (67.132 kg)  SpO2 97%  BP 124/70  Pulse 91  Temp(Src) 98.6 F (37 C) (Oral)  Wt 148 lb (67.132 kg)  SpO2 97% General appearance: alert, cooperative, appears stated age and no distress Head: Normocephalic, without obvious abnormality, atraumatic Eyes: conjunctivae/corneas clear. PERRL, EOM's intact. Fundi benign. Ears: normal TM's and external ear canals both ears Nose:  Nares normal. Septum midline. Mucosa normal. No drainage or sinus tenderness. Throat: lips, mucosa, and tongue normal; teeth and gums normal Neck: no adenopathy, no carotid bruit, no JVD, supple, symmetrical, trachea midline and thyroid not enlarged, symmetric, no tenderness/mass/nodules Back: symmetric, no curvature. ROM normal. No CVA tenderness. Lungs: clear to auscultation bilaterally Breasts: normal appearance, no masses or tenderness Heart: regular rate and rhythm, S1, S2 normal, no murmur, click, rub or gallop Abdomen: soft, non-tender; bowel sounds normal; no masses,  no organomegaly Extremities: extremities normal, atraumatic, no cyanosis or edema Pulses: 2+ and symmetric Skin: Skin color, texture, turgor normal. No rashes or lesions Lymph nodes: Cervical, supraclavicular, and axillary nodes normal. Neurologic: Alert and oriented X 3, normal strength and tone. Normal symmetric reflexes. Normal coordination and gait Sensory exam of the foot is normal, tested with the monofilament. Good pulses, no lesions or ulcers, good peripheral pulses. Psych--no depression/ anxiety       Assessment:    1. Preventative Care  2. DMII  3. HTN   4.  Hyperlipidemia   5. hypothyroid   Plan:     ghm utd  check fasting labs--con't current meds During the course of the visit the patient was educated and counseled about appropriate screening and preventive services including:    Screening electrocardiogram  Screening mammography  Screening Pap smear and pelvic exam   Bone densitometry screening  Colorectal cancer screening  Diabetes screening  Glaucoma screening  Advanced directives: has an advanced directive - a copy HAS NOT been provided.  Diet review for nutrition referral? Yes ____  Not Indicated __x__   Patient Instructions (the written plan) was given to the patient.  Medicare Attestation I have personally reviewed: The patient's medical and social history Their use of  alcohol, tobacco or illicit drugs Their current medications and supplements The patient's functional ability including ADLs,fall risks, home safety risks, cognitive, and hearing and visual impairment Diet and physical activities Evidence for depression or mood disorders  The patient's weight, height, BMI, and visual acuity have been recorded in the chart.  I have made referrals, counseling, and provided education to the patient based on review of the above and I have provided the patient with a written personalized care plan for preventive services.     Garnet Koyanagi, DO   12/20/2010

## 2010-12-20 NOTE — Patient Instructions (Signed)

## 2010-12-20 NOTE — Progress Notes (Signed)
  Subjective:    Patient ID: Cheryl Cisneros, female    DOB: 06/26/1945, 65 y.o.   MRN: [id number]  HPI    Review of Systems     Objective:   Physical Exam  Cardiovascular:  Murmur heard.      Correction from original note          Assessment & Plan:

## 2010-12-27 ENCOUNTER — Other Ambulatory Visit: Payer: Self-pay | Admitting: Family Medicine

## 2010-12-27 NOTE — Progress Notes (Signed)
  Subjective:    Patient ID: Cheryl Cisneros, female    DOB: 09-22-1945, 65 y.o.   MRN: [id number]  HPI    Review of Systems   Dr Alcide Evener   Dr Velora Heckler-- allergist    Dr Lennette Bihari Henry--podiatrist Objective:   Physical Exam        Assessment & Plan:

## 2011-01-10 ENCOUNTER — Other Ambulatory Visit: Payer: PRIVATE HEALTH INSURANCE

## 2011-01-10 ENCOUNTER — Telehealth: Payer: Self-pay

## 2011-01-10 ENCOUNTER — Other Ambulatory Visit: Payer: Self-pay | Admitting: Family Medicine

## 2011-01-10 DIAGNOSIS — Z1211 Encounter for screening for malignant neoplasm of colon: Secondary | ICD-10-CM

## 2011-01-10 LAB — FECAL OCCULT BLOOD, IMMUNOCHEMICAL: Fecal Occult Bld: NEGATIVE

## 2011-01-14 NOTE — Telephone Encounter (Signed)
Error

## 2011-01-24 ENCOUNTER — Other Ambulatory Visit: Payer: Self-pay | Admitting: Family Medicine

## 2011-02-12 ENCOUNTER — Ambulatory Visit (INDEPENDENT_AMBULATORY_CARE_PROVIDER_SITE_OTHER): Payer: Medicare Other

## 2011-02-12 DIAGNOSIS — Z23 Encounter for immunization: Secondary | ICD-10-CM

## 2011-02-18 ENCOUNTER — Encounter: Payer: Self-pay | Admitting: Family Medicine

## 2011-02-26 ENCOUNTER — Encounter: Payer: Self-pay | Admitting: Family Medicine

## 2011-03-19 ENCOUNTER — Other Ambulatory Visit: Payer: Self-pay | Admitting: Family Medicine

## 2011-04-20 ENCOUNTER — Other Ambulatory Visit: Payer: Self-pay | Admitting: Family Medicine

## 2011-05-23 ENCOUNTER — Other Ambulatory Visit: Payer: Self-pay | Admitting: Family Medicine

## 2011-05-23 DIAGNOSIS — E119 Type 2 diabetes mellitus without complications: Secondary | ICD-10-CM

## 2011-05-23 DIAGNOSIS — E785 Hyperlipidemia, unspecified: Secondary | ICD-10-CM

## 2011-05-24 ENCOUNTER — Other Ambulatory Visit (INDEPENDENT_AMBULATORY_CARE_PROVIDER_SITE_OTHER): Payer: Medicare Other

## 2011-05-24 DIAGNOSIS — E785 Hyperlipidemia, unspecified: Secondary | ICD-10-CM | POA: Diagnosis not present

## 2011-05-24 DIAGNOSIS — J309 Allergic rhinitis, unspecified: Secondary | ICD-10-CM | POA: Diagnosis not present

## 2011-05-24 DIAGNOSIS — E119 Type 2 diabetes mellitus without complications: Secondary | ICD-10-CM

## 2011-05-24 LAB — BASIC METABOLIC PANEL
BUN: 16 mg/dL (ref 6–23)
CO2: 26 mEq/L (ref 19–32)
Chloride: 104 mEq/L (ref 96–112)
GFR: 73.77 mL/min (ref >60.00)
Glucose, Bld: 128 mg/dL — ABNORMAL HIGH (ref 70–99)
Potassium: 4.8 mEq/L (ref 3.5–5.1)

## 2011-05-24 LAB — HEPATIC FUNCTION PANEL
ALT: 24 U/L (ref 0–35)
Bilirubin, Direct: 0 mg/dL (ref 0.0–0.3)
Total Bilirubin: 0.6 mg/dL (ref 0.3–1.2)

## 2011-05-24 LAB — LIPID PANEL
Cholesterol: 125 mg/dL (ref 0–200)
Total CHOL/HDL Ratio: 3
Triglycerides: 96 mg/dL (ref 0.0–149.0)

## 2011-05-28 ENCOUNTER — Telehealth: Payer: Self-pay | Admitting: Family Medicine

## 2011-05-28 MED ORDER — METFORMIN HCL 500 MG PO TABS: 500.0000 mg | ORAL_TABLET | Freq: Two times a day (BID) | ORAL | Status: DC

## 2011-05-28 MED ORDER — PRAVASTATIN SODIUM 10 MG PO TABS: 10.0000 mg | ORAL_TABLET | Freq: Every day | ORAL | Status: DC

## 2011-05-28 NOTE — Telephone Encounter (Signed)
Refill- Pravachol 10 mg tablet. Take 1 tablet by mouth at bedtime. Last fill 04/22/2011  Refill- Glucophage 500 mg tablet. Take 1 tablet by mouth twice daily. Last fill 04/22/2011  Pharmacy comments: Patient requesting 90 day supply for both meds.

## 2011-05-28 NOTE — Telephone Encounter (Signed)
Rx faxed.    KP 

## 2011-05-29 DIAGNOSIS — J309 Allergic rhinitis, unspecified: Secondary | ICD-10-CM | POA: Diagnosis not present

## 2011-06-21 DIAGNOSIS — J309 Allergic rhinitis, unspecified: Secondary | ICD-10-CM | POA: Diagnosis not present

## 2011-06-25 DIAGNOSIS — J309 Allergic rhinitis, unspecified: Secondary | ICD-10-CM | POA: Diagnosis not present

## 2011-07-09 DIAGNOSIS — H2510 Age-related nuclear cataract, unspecified eye: Secondary | ICD-10-CM | POA: Diagnosis not present

## 2011-07-09 DIAGNOSIS — H40029 Open angle with borderline findings, high risk, unspecified eye: Secondary | ICD-10-CM | POA: Diagnosis not present

## 2011-07-09 DIAGNOSIS — H524 Presbyopia: Secondary | ICD-10-CM | POA: Diagnosis not present

## 2011-07-17 DIAGNOSIS — J309 Allergic rhinitis, unspecified: Secondary | ICD-10-CM | POA: Diagnosis not present

## 2011-07-23 DIAGNOSIS — J309 Allergic rhinitis, unspecified: Secondary | ICD-10-CM | POA: Diagnosis not present

## 2011-07-26 DIAGNOSIS — J309 Allergic rhinitis, unspecified: Secondary | ICD-10-CM | POA: Diagnosis not present

## 2011-07-29 DIAGNOSIS — J309 Allergic rhinitis, unspecified: Secondary | ICD-10-CM | POA: Diagnosis not present

## 2011-08-01 DIAGNOSIS — J309 Allergic rhinitis, unspecified: Secondary | ICD-10-CM | POA: Diagnosis not present

## 2011-08-22 DIAGNOSIS — J309 Allergic rhinitis, unspecified: Secondary | ICD-10-CM | POA: Diagnosis not present

## 2011-09-18 DIAGNOSIS — J309 Allergic rhinitis, unspecified: Secondary | ICD-10-CM | POA: Diagnosis not present

## 2011-09-20 ENCOUNTER — Other Ambulatory Visit: Payer: Self-pay | Admitting: Family Medicine

## 2011-09-20 DIAGNOSIS — J309 Allergic rhinitis, unspecified: Secondary | ICD-10-CM | POA: Diagnosis not present

## 2011-09-23 DIAGNOSIS — H5340 Unspecified visual field defects: Secondary | ICD-10-CM | POA: Diagnosis not present

## 2011-09-23 DIAGNOSIS — H353190 Nonexudative age-related macular degeneration, unspecified eye, stage unspecified: Secondary | ICD-10-CM | POA: Diagnosis not present

## 2011-09-24 DIAGNOSIS — J3081 Allergic rhinitis due to animal (cat) (dog) hair and dander: Secondary | ICD-10-CM | POA: Diagnosis not present

## 2011-09-24 DIAGNOSIS — J3089 Other allergic rhinitis: Secondary | ICD-10-CM | POA: Diagnosis not present

## 2011-09-24 DIAGNOSIS — J301 Allergic rhinitis due to pollen: Secondary | ICD-10-CM | POA: Diagnosis not present

## 2011-09-24 DIAGNOSIS — J45909 Unspecified asthma, uncomplicated: Secondary | ICD-10-CM | POA: Diagnosis not present

## 2011-10-10 DIAGNOSIS — J309 Allergic rhinitis, unspecified: Secondary | ICD-10-CM | POA: Diagnosis not present

## 2011-10-14 DIAGNOSIS — J309 Allergic rhinitis, unspecified: Secondary | ICD-10-CM | POA: Diagnosis not present

## 2011-10-28 DIAGNOSIS — M2010 Hallux valgus (acquired), unspecified foot: Secondary | ICD-10-CM | POA: Diagnosis not present

## 2011-10-28 DIAGNOSIS — E119 Type 2 diabetes mellitus without complications: Secondary | ICD-10-CM | POA: Diagnosis not present

## 2011-11-08 DIAGNOSIS — J309 Allergic rhinitis, unspecified: Secondary | ICD-10-CM | POA: Diagnosis not present

## 2011-11-11 DIAGNOSIS — J309 Allergic rhinitis, unspecified: Secondary | ICD-10-CM | POA: Diagnosis not present

## 2011-11-14 DIAGNOSIS — J309 Allergic rhinitis, unspecified: Secondary | ICD-10-CM | POA: Diagnosis not present

## 2011-11-15 ENCOUNTER — Other Ambulatory Visit: Payer: Self-pay | Admitting: Family Medicine

## 2011-12-13 DIAGNOSIS — J309 Allergic rhinitis, unspecified: Secondary | ICD-10-CM | POA: Diagnosis not present

## 2011-12-16 ENCOUNTER — Other Ambulatory Visit: Payer: Self-pay | Admitting: Family Medicine

## 2011-12-25 ENCOUNTER — Encounter: Payer: Self-pay | Admitting: Family Medicine

## 2011-12-25 ENCOUNTER — Ambulatory Visit (INDEPENDENT_AMBULATORY_CARE_PROVIDER_SITE_OTHER): Payer: Medicare Other | Admitting: Family Medicine

## 2011-12-25 ENCOUNTER — Other Ambulatory Visit (HOSPITAL_COMMUNITY)
Admission: RE | Admit: 2011-12-25 | Discharge: 2011-12-25 | Disposition: A | Discharge status: Home / Self Care | Payer: Medicare Other | Source: Ambulatory Visit | Attending: Family Medicine | Admitting: Family Medicine

## 2011-12-25 VITALS — BP 130/72 | HR 97 | Temp 98.3°F | Ht 61.75 in | Wt 148.8 lb

## 2011-12-25 DIAGNOSIS — M899 Disorder of bone, unspecified: Secondary | ICD-10-CM | POA: Diagnosis not present

## 2011-12-25 DIAGNOSIS — H3530 Unspecified macular degeneration: Secondary | ICD-10-CM

## 2011-12-25 DIAGNOSIS — J45909 Unspecified asthma, uncomplicated: Secondary | ICD-10-CM

## 2011-12-25 DIAGNOSIS — E119 Type 2 diabetes mellitus without complications: Secondary | ICD-10-CM

## 2011-12-25 DIAGNOSIS — E039 Hypothyroidism, unspecified: Secondary | ICD-10-CM | POA: Diagnosis not present

## 2011-12-25 DIAGNOSIS — I080 Rheumatic disorders of both mitral and aortic valves: Secondary | ICD-10-CM | POA: Diagnosis not present

## 2011-12-25 DIAGNOSIS — Z0000 Encounter for general adult medical examination without abnormal findings: Secondary | ICD-10-CM

## 2011-12-25 DIAGNOSIS — I10 Essential (primary) hypertension: Secondary | ICD-10-CM

## 2011-12-25 DIAGNOSIS — Z124 Encounter for screening for malignant neoplasm of cervix: Secondary | ICD-10-CM | POA: Insufficient documentation

## 2011-12-25 DIAGNOSIS — M949 Disorder of cartilage, unspecified: Secondary | ICD-10-CM

## 2011-12-25 DIAGNOSIS — E785 Hyperlipidemia, unspecified: Secondary | ICD-10-CM | POA: Diagnosis not present

## 2011-12-25 HISTORY — DX: Unspecified macular degeneration: H35.30

## 2011-12-25 LAB — CBC WITH DIFFERENTIAL/PLATELET
Eosinophils Absolute: 0.1 10*3/uL (ref 0.0–0.7)
HCT: 40.1 % (ref 36.0–46.0)
Lymphs Abs: 2.8 10*3/uL (ref 0.7–4.0)
MCHC: 33.1 g/dL (ref 30.0–36.0)
MCV: 91.7 fl (ref 78.0–100.0)
Monocytes Absolute: 0.3 10*3/uL (ref 0.1–1.0)
Neutrophils Relative %: 53.6 % (ref 43.0–77.0)
Platelets: 227 10*3/uL (ref 150.0–400.0)
RDW: 14.6 % (ref 11.5–14.6)

## 2011-12-25 LAB — POCT URINALYSIS DIPSTICK
Bilirubin, UA: NEGATIVE
Blood, UA: NEGATIVE
Glucose, UA: NEGATIVE
Leukocytes, UA: NEGATIVE
Nitrite, UA: NEGATIVE

## 2011-12-25 LAB — BASIC METABOLIC PANEL
BUN: 18 mg/dL (ref 6–23)
CO2: 26 mEq/L (ref 19–32)
Chloride: 102 mEq/L (ref 96–112)
Creatinine, Ser: 0.8 mg/dL (ref 0.4–1.2)
Glucose, Bld: 123 mg/dL — ABNORMAL HIGH (ref 70–99)
Potassium: 4.7 mEq/L (ref 3.5–5.1)

## 2011-12-25 LAB — HEPATIC FUNCTION PANEL
ALT: 32 U/L (ref 0–35)
Total Bilirubin: 0.6 mg/dL (ref 0.3–1.2)

## 2011-12-25 LAB — LIPID PANEL
Cholesterol: 131 mg/dL (ref 0–200)
LDL Cholesterol: 60 mg/dL (ref 0–99)
Triglycerides: 99 mg/dL (ref 0.0–149.0)

## 2011-12-25 LAB — MICROALBUMIN / CREATININE URINE RATIO: Microalb Creat Ratio: 6.5 mg/g (ref 0.0–30.0)

## 2011-12-25 LAB — HEMOGLOBIN A1C: Hgb A1c MFr Bld: 6.5 % (ref 4.6–6.5)

## 2011-12-25 LAB — TSH: TSH: 0.82 u[IU]/mL (ref 0.35–5.50)

## 2011-12-25 NOTE — Assessment & Plan Note (Signed)
Check labs con't meds 

## 2011-12-25 NOTE — Patient Instructions (Signed)
Preventive Care for Adults, Female A healthy lifestyle and preventive care can promote health and wellness. Preventive health guidelines for women include the following key practices.  A routine yearly physical is a good way to check with your caregiver about your health and preventive screening. It is a chance to share any concerns and updates on your health, and to receive a thorough exam.   Visit your dentist for a routine exam and preventive care every 6 months. Brush your teeth twice a day and floss once a day. Good oral hygiene prevents tooth decay and gum disease.   The frequency of eye exams is based on your age, health, family medical history, use of contact lenses, and other factors. Follow your caregiver's recommendations for frequency of eye exams.   Eat a healthy diet. Foods like vegetables, fruits, whole grains, low-fat dairy products, and lean protein foods contain the nutrients you need without too many calories. Decrease your intake of foods high in solid fats, added sugars, and salt. Eat the right amount of calories for you.Get information about a proper diet from your caregiver, if necessary.   Regular physical exercise is one of the most important things you can do for your health. Most adults should get at least 150 minutes of moderate-intensity exercise (any activity that increases your heart rate and causes you to sweat) each week. In addition, most adults need muscle-strengthening exercises on 2 or more days a week.   Maintain a healthy weight. The body mass index (BMI) is a screening tool to identify possible weight problems. It provides an estimate of body fat based on height and weight. Your caregiver can help determine your BMI, and can help you achieve or maintain a healthy weight.For adults 20 years and older:   A BMI below 18.5 is considered underweight.   A BMI of 18.5 to 24.9 is normal.   A BMI of 25 to 29.9 is considered overweight.   A BMI of 30 and above is  considered obese.   Maintain normal blood lipids and cholesterol levels by exercising and minimizing your intake of saturated fat. Eat a balanced diet with plenty of fruit and vegetables. Blood tests for lipids and cholesterol should begin at age 20 and be repeated every 5 years. If your lipid or cholesterol levels are high, you are over 50, or you are at high risk for heart disease, you may need your cholesterol levels checked more frequently.Ongoing high lipid and cholesterol levels should be treated with medicines if diet and exercise are not effective.   If you smoke, find out from your caregiver how to quit. If you do not use tobacco, do not start.   If you are pregnant, do not drink alcohol. If you are breastfeeding, be very cautious about drinking alcohol. If you are not pregnant and choose to drink alcohol, do not exceed 1 drink per day. One drink is considered to be 12 ounces (355 mL) of beer, 5 ounces (148 mL) of wine, or 1.5 ounces (44 mL) of liquor.   Avoid use of street drugs. Do not share needles with anyone. Ask for help if you need support or instructions about stopping the use of drugs.   High blood pressure causes heart disease and increases the risk of stroke. Your blood pressure should be checked at least every 1 to 2 years. Ongoing high blood pressure should be treated with medicines if weight loss and exercise are not effective.   If you are 55 to 68   years old, ask your caregiver if you should take aspirin to prevent strokes.   Diabetes screening involves taking a blood sample to check your fasting blood sugar level. This should be done once every 3 years, after age 45, if you are within normal weight and without risk factors for diabetes. Testing should be considered at a younger age or be carried out more frequently if you are overweight and have at least 1 risk factor for diabetes.   Breast cancer screening is essential preventive care for women. You should practice "breast  self-awareness." This means understanding the normal appearance and feel of your breasts and may include breast self-examination. Any changes detected, no matter how small, should be reported to a caregiver. Women in their 20s and 30s should have a clinical breast exam (CBE) by a caregiver as part of a regular health exam every 1 to 3 years. After age 40, women should have a CBE every year. Starting at age 40, women should consider having a mammography (breast X-ray test) every year. Women who have a family history of breast cancer should talk to their caregiver about genetic screening. Women at a high risk of breast cancer should talk to their caregivers about having magnetic resonance imaging (MRI) and a mammography every year.   The Pap test is a screening test for cervical cancer. A Pap test can show cell changes on the cervix that might become cervical cancer if left untreated. A Pap test is a procedure in which cells are obtained and examined from the lower end of the uterus (cervix).   Women should have a Pap test starting at age 21.   Between ages 21 and 29, Pap tests should be repeated every 2 years.   Beginning at age 30, you should have a Pap test every 3 years as long as the past 3 Pap tests have been normal.   Some women have medical problems that increase the chance of getting cervical cancer. Talk to your caregiver about these problems. It is especially important to talk to your caregiver if a new problem develops soon after your last Pap test. In these cases, your caregiver may recommend more frequent screening and Pap tests.   The above recommendations are the same for women who have or have not gotten the vaccine for human papillomavirus (HPV).   If you had a hysterectomy for a problem that was not cancer or a condition that could lead to cancer, then you no longer need Pap tests. Even if you no longer need a Pap test, a regular exam is a good idea to make sure no other problems are  starting.   If you are between ages 65 and 70, and you have had normal Pap tests going back 10 years, you no longer need Pap tests. Even if you no longer need a Pap test, a regular exam is a good idea to make sure no other problems are starting.   If you have had past treatment for cervical cancer or a condition that could lead to cancer, you need Pap tests and screening for cancer for at least 20 years after your treatment.   If Pap tests have been discontinued, risk factors (such as a new sexual partner) need to be reassessed to determine if screening should be resumed.   The HPV test is an additional test that may be used for cervical cancer screening. The HPV test looks for the virus that can cause the cell changes on the cervix.   The cells collected during the Pap test can be tested for HPV. The HPV test could be used to screen women aged 30 years and older, and should be used in women of any age who have unclear Pap test results. After the age of 30, women should have HPV testing at the same frequency as a Pap test.   Colorectal cancer can be detected and often prevented. Most routine colorectal cancer screening begins at the age of 50 and continues through age 75. However, your caregiver may recommend screening at an earlier age if you have risk factors for colon cancer. On a yearly basis, your caregiver may provide home test kits to check for hidden blood in the stool. Use of a small camera at the end of a tube, to directly examine the colon (sigmoidoscopy or colonoscopy), can detect the earliest forms of colorectal cancer. Talk to your caregiver about this at age 50, when routine screening begins. Direct examination of the colon should be repeated every 5 to 10 years through age 75, unless early forms of pre-cancerous polyps or small growths are found.   Hepatitis C blood testing is recommended for all people born from 1945 through 1965 and any individual with known risks for hepatitis C.    Practice safe sex. Use condoms and avoid high-risk sexual practices to reduce the spread of sexually transmitted infections (STIs). STIs include gonorrhea, chlamydia, syphilis, trichomonas, herpes, HPV, and human immunodeficiency virus (HIV). Herpes, HIV, and HPV are viral illnesses that have no cure. They can result in disability, cancer, and death. Sexually active women aged 25 and younger should be checked for chlamydia. Older women with new or multiple partners should also be tested for chlamydia. Testing for other STIs is recommended if you are sexually active and at increased risk.   Osteoporosis is a disease in which the bones lose minerals and strength with aging. This can result in serious bone fractures. The risk of osteoporosis can be identified using a bone density scan. Women ages 65 and over and women at risk for fractures or osteoporosis should discuss screening with their caregivers. Ask your caregiver whether you should take a calcium supplement or vitamin D to reduce the rate of osteoporosis.   Menopause can be associated with physical symptoms and risks. Hormone replacement therapy is available to decrease symptoms and risks. You should talk to your caregiver about whether hormone replacement therapy is right for you.   Use sunscreen with sun protection factor (SPF) of 30 or more. Apply sunscreen liberally and repeatedly throughout the day. You should seek shade when your shadow is shorter than you. Protect yourself by wearing long sleeves, pants, a wide-brimmed hat, and sunglasses year round, whenever you are outdoors.   Once a month, do a whole body skin exam, using a mirror to look at the skin on your back. Notify your caregiver of new moles, moles that have irregular borders, moles that are larger than a pencil eraser, or moles that have changed in shape or color.   Stay current with required immunizations.   Influenza. You need a dose every fall (or winter). The composition of  the flu vaccine changes each year, so being vaccinated once is not enough.   Pneumococcal polysaccharide. You need 1 to 2 doses if you smoke cigarettes or if you have certain chronic medical conditions. You need 1 dose at age 65 (or older) if you have never been vaccinated.   Tetanus, diphtheria, pertussis (Tdap, Td). Get 1 dose of   Tdap vaccine if you are younger than age 65, are over 65 and have contact with an infant, are a healthcare worker, are pregnant, or simply want to be protected from whooping cough. After that, you need a Td booster dose every 10 years. Consult your caregiver if you have not had at least 3 tetanus and diphtheria-containing shots sometime in your life or have a deep or dirty wound.   HPV. You need this vaccine if you are a woman age 26 or younger. The vaccine is given in 3 doses over 6 months.   Measles, mumps, rubella (MMR). You need at least 1 dose of MMR if you were born in 1957 or later. You may also need a second dose.   Meningococcal. If you are age 19 to 21 and a first-year college student living in a residence hall, or have one of several medical conditions, you need to get vaccinated against meningococcal disease. You may also need additional booster doses.   Zoster (shingles). If you are age 60 or older, you should get this vaccine.   Varicella (chickenpox). If you have never had chickenpox or you were vaccinated but received only 1 dose, talk to your caregiver to find out if you need this vaccine.   Hepatitis A. You need this vaccine if you have a specific risk factor for hepatitis A virus infection or you simply wish to be protected from this disease. The vaccine is usually given as 2 doses, 6 to 18 months apart.   Hepatitis B. You need this vaccine if you have a specific risk factor for hepatitis B virus infection or you simply wish to be protected from this disease. The vaccine is given in 3 doses, usually over 6 months.  Preventive Services /  Frequency Ages 19 to 39  Blood pressure check.** / Every 1 to 2 years.   Lipid and cholesterol check.** / Every 5 years beginning at age 20.   Clinical breast exam.** / Every 3 years for women in their 20s and 30s.   Pap test.** / Every 2 years from ages 21 through 29. Every 3 years starting at age 30 through age 65 or 70 with a history of 3 consecutive normal Pap tests.   HPV screening.** / Every 3 years from ages 30 through ages 65 to 70 with a history of 3 consecutive normal Pap tests.   Hepatitis C blood test.** / For any individual with known risks for hepatitis C.   Skin self-exam. / Monthly.   Influenza immunization.** / Every year.   Pneumococcal polysaccharide immunization.** / 1 to 2 doses if you smoke cigarettes or if you have certain chronic medical conditions.   Tetanus, diphtheria, pertussis (Tdap, Td) immunization. / A one-time dose of Tdap vaccine. After that, you need a Td booster dose every 10 years.   HPV immunization. / 3 doses over 6 months, if you are 26 and younger.   Measles, mumps, rubella (MMR) immunization. / You need at least 1 dose of MMR if you were born in 1957 or later. You may also need a second dose.   Meningococcal immunization. / 1 dose if you are age 19 to 21 and a first-year college student living in a residence hall, or have one of several medical conditions, you need to get vaccinated against meningococcal disease. You may also need additional booster doses.   Varicella immunization.** / Consult your caregiver.   Hepatitis A immunization.** / Consult your caregiver. 2 doses, 6 to 18 months   apart.   Hepatitis B immunization.** / Consult your caregiver. 3 doses usually over 6 months.  Ages 40 to 64  Blood pressure check.** / Every 1 to 2 years.   Lipid and cholesterol check.** / Every 5 years beginning at age 20.   Clinical breast exam.** / Every year after age 40.   Mammogram.** / Every year beginning at age 40 and continuing for as  long as you are in good health. Consult with your caregiver.   Pap test.** / Every 3 years starting at age 30 through age 65 or 70 with a history of 3 consecutive normal Pap tests.   HPV screening.** / Every 3 years from ages 30 through ages 65 to 70 with a history of 3 consecutive normal Pap tests.   Fecal occult blood test (FOBT) of stool. / Every year beginning at age 50 and continuing until age 75. You may not need to do this test if you get a colonoscopy every 10 years.   Flexible sigmoidoscopy or colonoscopy.** / Every 5 years for a flexible sigmoidoscopy or every 10 years for a colonoscopy beginning at age 50 and continuing until age 75.   Hepatitis C blood test.** / For all people born from 1945 through 1965 and any individual with known risks for hepatitis C.   Skin self-exam. / Monthly.   Influenza immunization.** / Every year.   Pneumococcal polysaccharide immunization.** / 1 to 2 doses if you smoke cigarettes or if you have certain chronic medical conditions.   Tetanus, diphtheria, pertussis (Tdap, Td) immunization.** / A one-time dose of Tdap vaccine. After that, you need a Td booster dose every 10 years.   Measles, mumps, rubella (MMR) immunization. / You need at least 1 dose of MMR if you were born in 1957 or later. You may also need a second dose.   Varicella immunization.** / Consult your caregiver.   Meningococcal immunization.** / Consult your caregiver.   Hepatitis A immunization.** / Consult your caregiver. 2 doses, 6 to 18 months apart.   Hepatitis B immunization.** / Consult your caregiver. 3 doses, usually over 6 months.  Ages 65 and over  Blood pressure check.** / Every 1 to 2 years.   Lipid and cholesterol check.** / Every 5 years beginning at age 20.   Clinical breast exam.** / Every year after age 40.   Mammogram.** / Every year beginning at age 40 and continuing for as long as you are in good health. Consult with your caregiver.   Pap test.** /  Every 3 years starting at age 30 through age 65 or 70 with a 3 consecutive normal Pap tests. Testing can be stopped between 65 and 70 with 3 consecutive normal Pap tests and no abnormal Pap or HPV tests in the past 10 years.   HPV screening.** / Every 3 years from ages 30 through ages 65 or 70 with a history of 3 consecutive normal Pap tests. Testing can be stopped between 65 and 70 with 3 consecutive normal Pap tests and no abnormal Pap or HPV tests in the past 10 years.   Fecal occult blood test (FOBT) of stool. / Every year beginning at age 50 and continuing until age 75. You may not need to do this test if you get a colonoscopy every 10 years.   Flexible sigmoidoscopy or colonoscopy.** / Every 5 years for a flexible sigmoidoscopy or every 10 years for a colonoscopy beginning at age 50 and continuing until age 75.   Hepatitis   C blood test.** / For all people born from 1945 through 1965 and any individual with known risks for hepatitis C.   Osteoporosis screening.** / A one-time screening for women ages 65 and over and women at risk for fractures or osteoporosis.   Skin self-exam. / Monthly.   Influenza immunization.** / Every year.   Pneumococcal polysaccharide immunization.** / 1 dose at age 65 (or older) if you have never been vaccinated.   Tetanus, diphtheria, pertussis (Tdap, Td) immunization. / A one-time dose of Tdap vaccine if you are over 65 and have contact with an infant, are a healthcare worker, or simply want to be protected from whooping cough. After that, you need a Td booster dose every 10 years.   Varicella immunization.** / Consult your caregiver.   Meningococcal immunization.** / Consult your caregiver.   Hepatitis A immunization.** / Consult your caregiver. 2 doses, 6 to 18 months apart.   Hepatitis B immunization.** / Check with your caregiver. 3 doses, usually over 6 months.  ** Family history and personal history of risk and conditions may change your caregiver's  recommendations. Document Released: 06/04/2001 Document Revised: 03/28/2011 Document Reviewed: 09/03/2010 [employer] Patient Information 2012 [employer]. 

## 2011-12-25 NOTE — Progress Notes (Signed)
Subjective:    Cheryl Cisneros is a 66 y.o. female who presents for Medicare Annual/Subsequent preventive examination.  Preventive Screening-Counseling & Management  Tobacco History  Smoking status  . Never Smoker   Smokeless tobacco  . Never Used     Problems Prior to Visit 1. none  Current Problems (verified) Patient Active Problem List  Diagnosis  . MOLE  . HYPOTHYROIDISM  . DIABETES MELLITUS, TYPE II  . UNSPECIFIED GLAUCOMA  . OTHER CATARACT  . TINNITUS, RIGHT  . MITRAL REGURGITATION, MODERATE  . HYPERTENSION  . ALLERGIC RHINITIS  . INFLUENZA WITH OTHER RESPIRATORY MANIFESTATIONS  . ASTHMA  . GERD  . UTI  . ARTIFICIAL MENOPAUSE  . OSTEOPENIA  . HEADACHE  . NEPHROLITHIASIS, HX OF  . Other Postprocedural Status  . Contact dermatitis due to plants, except food    Medications Prior to Visit Current Outpatient Prescriptions on File Prior to Visit  Medication Sig Dispense Refill  . amLODipine (NORVASC) 5 MG tablet Take 1 tablet (5 mg total) by mouth daily.  90 tablet  3  . aspirin 81 MG tablet Take 81 mg by mouth daily.        . Calcium-Vitamin D 600-200 MG-UNIT per tablet Take 1 tablet by mouth 3 (three) times daily with meals.        . cetirizine (ZYRTEC) 10 MG tablet Take 1 tablet (10 mg total) by mouth daily.  90 tablet  3  . Cholecalciferol (VITAMIN D3) 1000 UNITS CAPS Take 1 capsule by mouth daily.        Marland Kitchen EPIPEN 2-PAK 0.3 MG/0.3ML DEVI       . Garlic Oil (SUPER GARLIC) 123XX123 MG CAPS Take 2 capsules by mouth daily.        Marland Kitchen L-Lysine 500 MG TABS Take 1 tablet by mouth daily.        Marland Kitchen levothyroxine (SYNTHROID, LEVOTHROID) 112 MCG tablet TAKE ONE TABLET BY MOUTH EVERY DAY  90 tablet  0  . LUTEIN-ZEAXANTHIN PO Take 1 tablet by mouth daily.        . Magnesium 250 MG TABS Take 1 tablet by mouth 2 (two)  times daily.        . metFORMIN (GLUCOPHAGE) 500 MG tablet TAKE ONE TABLET BY MOUTH TWICE DAILY WITH MEALS  60 tablet  5  . Methylcellulose, Laxative, (FIBER THERAPY PO) Take 2 tablets by mouth at bedtime as needed.        . Multiple Vitamins-Minerals (CENTRUM SILVER PO) Take 1 tablet by mouth daily.        . naproxen sodium (ANAPROX) 220 MG tablet Take 220 mg by mouth 2 (two) times daily with a meal.        . Omega-3 Fatty Acids (OMEGA 3 PO) Take 1 tablet by mouth daily.        Marland Kitchen omeprazole (PRILOSEC) 40 MG capsule Take 1 capsule (40 mg total) by mouth daily.  90 capsule  3  . ONE TOUCH ULTRA TEST test strip TEST BLOOD SUGAR TWICE DAILY  200  each  2  . ONETOUCH DELICA LANCETS MISC 1 Stick by Does not apply route 2 (two) times daily.  100 each  5  . pravastatin (PRAVACHOL) 10 MG tablet TAKE ONE TABLET BY MOUTH EVERY DAY  90 tablet  0  . SIMPLY SALINE NA Place 1 spray into the nose daily.        Marland Kitchen azelastine (ASTELIN) 137 MCG/SPRAY nasal spray 1 spray by Nasal route 2 (two) times daily. Use in each nostril as directed       . triamcinolone (NASACORT) 55 MCG/ACT nasal inhaler 2 sprays by Nasal route daily.          Current Medications (verified) Current Outpatient Prescriptions  Medication Sig Dispense Refill  . amLODipine (NORVASC) 5 MG tablet Take 1 tablet (5 mg total) by mouth daily.  90 tablet  3  . aspirin 81 MG tablet Take 81 mg by mouth daily.        . Calcium-Vitamin D 600-200 MG-UNIT per tablet Take 1 tablet by mouth 3 (three) times daily with meals.        . cetirizine (ZYRTEC) 10 MG tablet Take 1 tablet (10 mg total) by mouth daily.  90 tablet  3  . Cholecalciferol (VITAMIN D3) 1000 UNITS CAPS Take 1 capsule by mouth daily.        Marland Kitchen EPIPEN 2-PAK 0.3 MG/0.3ML DEVI       . Garlic Oil (SUPER GARLIC) 123XX123 MG CAPS Take 2 capsules by mouth daily.        Marland Kitchen L-Lysine 500 MG TABS Take 1 tablet by mouth daily.        Marland Kitchen levothyroxine (SYNTHROID, LEVOTHROID) 112 MCG tablet TAKE ONE TABLET BY  MOUTH EVERY DAY  90 tablet  0  . LUTEIN-ZEAXANTHIN PO Take 1 tablet by mouth daily.        . Magnesium 250 MG TABS Take 1 tablet by mouth 2 (two) times daily.        . metFORMIN (GLUCOPHAGE) 500 MG tablet TAKE ONE TABLET BY MOUTH TWICE DAILY WITH MEALS  60 tablet  5  . Methylcellulose, Laxative, (FIBER THERAPY PO) Take 2 tablets by mouth at bedtime as needed.        . Multiple Vitamins-Minerals (CENTRUM SILVER PO) Take 1 tablet by mouth daily.        . naproxen sodium (ANAPROX) 220 MG tablet Take 220 mg by mouth 2 (two) times daily with a meal.        . Omega-3 Fatty Acids (OMEGA 3 PO) Take 1 tablet by mouth daily.        Marland Kitchen omeprazole (PRILOSEC) 40 MG capsule Take 1 capsule (40 mg total) by mouth daily.  90 capsule  3  . ONE TOUCH ULTRA TEST test strip TEST BLOOD SUGAR TWICE DAILY  200 each  2  . ONETOUCH DELICA LANCETS MISC 1 Stick by Does not apply route 2 (two) times daily.  100 each  5  . pravastatin (PRAVACHOL) 10 MG tablet TAKE ONE TABLET BY MOUTH EVERY DAY  90 tablet  0  . SIMPLY SALINE NA Place 1 spray into the nose daily.        Marland Kitchen azelastine (ASTELIN) 137 MCG/SPRAY nasal spray 1 spray by Nasal route 2 (two) times daily. Use in each nostril as directed       . triamcinolone (NASACORT) 55 MCG/ACT nasal inhaler 2 sprays by Nasal route daily.           Allergies (verified) Review of  patient's allergies indicates no known allergies.   PAST HISTORY  Family History Family History  Problem Relation Age of Onset  . Coronary artery disease    . Diabetes Mother   . Hypertension Mother   . Kidney disease Mother     dialysis  . Lung cancer    . Diabetes Father   . Hypertension Father   . Cancer Father     lung  . Dementia Sister     Social History History  Substance Use Topics  . Smoking status: Never Smoker   . Smokeless tobacco: Never Used  . Alcohol Use: No     Are there smokers in your home (other than you)? No  Risk Factors Current exercise habits: Gym/ health club  routine includes boot camp.  Dietary issues discussed: na   Cardiac risk factors: advanced age (older than 11 for men, 44 for women), diabetes mellitus, dyslipidemia and hypertension.  Depression Screen (Note: if answer to either of the following is "Yes", a more complete depression screening is indicated)   Over the past two weeks, have you felt down, depressed or hopeless? No  Over the past two weeks, have you felt little interest or pleasure in doing things? No  Have you lost interest or pleasure in daily life? No  Do you often feel hopeless? No  Do you cry easily over simple problems? No  Activities of Daily Living In your present state of health, do you have any difficulty performing the following activities?:  Driving? No Managing money?  No Feeding yourself? No Getting from bed to chair? No Climbing a flight of stairs? No Preparing food and eating?: No Bathing or showering? No Getting dressed: No Getting to the toilet? No Using the toilet:No Moving around from place to place: No In the past year have you fallen or had a near fall?:No   Are you sexually active?  No  Do you have more than one partner?  No  Hearing Difficulties: No Do you often ask people to speak up or repeat themselves? No Do you experience ringing or noises in your ears? No Do you have difficulty understanding soft or whispered voices? No   Do you feel that you have a problem with memory? No  Do you often misplace items? No  Do you feel safe at home?  No  Cognitive Testing  Alert? Yes  Normal Appearance?Yes  Oriented to person? Yes  Place? Yes   Time? Yes  Recall of three objects?  Yes  Can perform simple calculations? Yes  Displays appropriate judgment?Yes  Can read the correct time from a watch face?Yes   Advanced Directives have been discussed with the patient? Yes  List the Names of Other Physician/Practitioners you currently use: 1.  opth--[facility]man  Indicate any recent Medical Services  you may have received from other than [facility] providers in the past year (date may be approximate).  Immunization History  Administered Date(s) Administered  . Influenza Split 02/12/2011  . Influenza Whole 02/01/2009  . Pneumococcal Polysaccharide 12/29/2007, 12/13/2008  . Td 09/16/2001  . Zoster 11/12/2007    Screening Tests Health Maintenance  Topic Date Due  . Foot Exam  10/07/1955  . Tetanus/tdap  09/17/2011  . Hemoglobin A1c  11/21/2011  . Urine Microalbumin  12/20/2011  . Influenza Vaccine  01/21/2012  . Ophthalmology Exam  09/23/2012  . Mammogram  02/10/2013  . Colonoscopy  04/04/2015  . Pneumococcal Polysaccharide Vaccine Age 64 And Over  Completed  . Zostavax  Completed    All answers were reviewed with the patient and necessary referrals were made:  Garnet Koyanagi, DO   12/25/2011   History reviewed: allergies, current medications, past family history, past medical history, past social history, past surgical history and problem list  Review of Systems  Review of Systems  Constitutional: Negative for activity change, appetite change and fatigue.  HENT: Negative for hearing loss, congestion, tinnitus and ear discharge.  dentist--q35m Eyes: Negative for visual disturbance (see optho q1y -- cataracts and MD).  Respiratory: Negative for cough, chest tightness and shortness of breath.   Cardiovascular: Negative for chest pain, palpitations and leg swelling.  Gastrointestinal: Negative for abdominal pain, diarrhea, constipation and abdominal distention.  Genitourinary: Negative for urgency, frequency, decreased urine volume and difficulty urinating.  Musculoskeletal: Negative for back pain, arthralgias and gait problem.  Skin: Negative for color change, pallor and rash.  Neurological: Negative for dizziness, light-headedness, numbness and headaches.  Hematological: Negative for adenopathy. Does not bruise/bleed easily.  Psychiatric/Behavioral: Negative for suicidal ideas, confusion,  sleep disturbance, self-injury, dysphoric mood, decreased concentration and agitation.  Pt is able to read and write and can do all ADLs No risk for falling No abuse/ violence in home     Objective:     Vision by Snellen chart: opth  Body mass index is 27.44 kg/(m^2). BP 130/72  Pulse 97  Temp 98.3 F (36.8 C) (Oral)  Ht 5' 1.75" (1.568 m)  Wt 148 lb 12.8 oz (67.495 kg)  BMI 27.44 kg/m2  SpO2 98%  BP 130/72  Pulse 97  Temp 98.3 F (36.8 C) (Oral)  Ht 5' 1.75" (1.568 m)  Wt 148 lb 12.8 oz (67.495 kg)  BMI 27.44 kg/m2  SpO2 98% General appearance: alert, cooperative, appears stated age and no distress Head: Normocephalic, without obvious abnormality, atraumatic Eyes: negative findings: lids and lashes normal, conjunctivae and sclerae normal and pupils equal, round, reactive to light and accomodation Ears: normal TM's and external ear canals both ears Nose: Nares normal. Septum midline. Mucosa normal. No drainage or sinus tenderness. Throat: lips, mucosa, and tongue normal; teeth and gums normal Neck: no adenopathy, no carotid bruit, no JVD, supple, symmetrical, trachea midline and thyroid not enlarged, symmetric, no tenderness/mass/nodules Back: symmetric, no curvature. ROM normal. No CVA tenderness. Lungs: clear to auscultation bilaterally Breasts: normal appearance, no masses or tenderness Heart: S1, S2 normal--+ murmur  2/6 Abdomen: soft, non-tender; bowel sounds normal; no masses,  no organomegaly Pelvic: external genitalia normal, no adnexal masses or tenderness, rectovaginal septum normal, uterus surgically absent and vagina normal without discharge Extremities: extremities normal, atraumatic, no cyanosis or edema Pulses: 2+ and symmetric Skin: Skin color, texture, turgor normal. No rashes or lesions Lymph nodes: Cervical, supraclavicular, and axillary nodes normal. Neurologic: Alert and oriented X 3, normal strength and tone. Normal symmetric reflexes. Normal  coordination and gait Psych-- no depression, no anxiey  Sensory exam of the foot is normal, tested with the monofilament. Good pulses, no lesions or ulcers, good peripheral pulses.    Assessment:     cpe     Plan:     During the course of the visit the patient was educated and counseled about appropriate screening and preventive services including:    Pneumococcal vaccine   Influenza vaccine  Td vaccine  Screening electrocardiogram  Screening mammography  Screening Pap smear and pelvic exam   Bone densitometry screening  Colorectal cancer screening  Diabetes screening  Glaucoma screening  Advanced directives: has an advanced directive -  a copy HAS NOT been provided.  Diet review for nutrition referral? Yes ____  Not Indicated ____   Patient Instructions (the written plan) was given to the patient.  Medicare Attestation I have personally reviewed: The patient's medical and social history Their use of alcohol, tobacco or illicit drugs Their current medications and supplements The patient's functional ability including ADLs,fall risks, home safety risks, cognitive, and hearing and visual impairment Diet and physical activities Evidence for depression or mood disorders  The patient's weight, height, BMI, and visual acuity have been recorded in the chart.  I have made referrals, counseling, and provided education to the patient based on review of the above and I have provided the patient with a written personalized care plan for preventive services.     Garnet Koyanagi, DO   12/25/2011

## 2011-12-25 NOTE — Assessment & Plan Note (Signed)
Pt states BS running high Check labs

## 2011-12-25 NOTE — Assessment & Plan Note (Signed)
con't meds prn Pt has not had any trouble

## 2011-12-25 NOTE — Assessment & Plan Note (Signed)
con't ca and vita d

## 2011-12-25 NOTE — Assessment & Plan Note (Signed)
Stable con't meds 

## 2011-12-25 NOTE — Assessment & Plan Note (Signed)
Stable Pt asymptomatic

## 2012-01-07 DIAGNOSIS — J309 Allergic rhinitis, unspecified: Secondary | ICD-10-CM | POA: Diagnosis not present

## 2012-01-23 DIAGNOSIS — Z23 Encounter for immunization: Secondary | ICD-10-CM | POA: Diagnosis not present

## 2012-02-05 DIAGNOSIS — J309 Allergic rhinitis, unspecified: Secondary | ICD-10-CM | POA: Diagnosis not present

## 2012-02-07 MED ORDER — AMBULATORY NON FORMULARY MEDICATION: Status: DC

## 2012-02-07 NOTE — Addendum Note (Signed)
Addended by: Ewing Schlein on: 02/07/2012 03:53 PM   Modules accepted: Orders

## 2012-02-11 ENCOUNTER — Other Ambulatory Visit: Payer: Self-pay | Admitting: Family Medicine

## 2012-02-12 DIAGNOSIS — Z1231 Encounter for screening mammogram for malignant neoplasm of breast: Secondary | ICD-10-CM | POA: Diagnosis not present

## 2012-03-06 DIAGNOSIS — J309 Allergic rhinitis, unspecified: Secondary | ICD-10-CM | POA: Diagnosis not present

## 2012-03-13 ENCOUNTER — Encounter: Payer: Self-pay | Admitting: Family Medicine

## 2012-03-16 ENCOUNTER — Other Ambulatory Visit: Payer: Self-pay | Admitting: Family Medicine

## 2012-03-27 DIAGNOSIS — J309 Allergic rhinitis, unspecified: Secondary | ICD-10-CM | POA: Diagnosis not present

## 2012-04-01 DIAGNOSIS — J309 Allergic rhinitis, unspecified: Secondary | ICD-10-CM | POA: Diagnosis not present

## 2012-05-01 DIAGNOSIS — J309 Allergic rhinitis, unspecified: Secondary | ICD-10-CM | POA: Diagnosis not present

## 2012-05-28 DIAGNOSIS — J309 Allergic rhinitis, unspecified: Secondary | ICD-10-CM | POA: Diagnosis not present

## 2012-06-06 ENCOUNTER — Other Ambulatory Visit: Payer: Self-pay

## 2012-06-10 DIAGNOSIS — J309 Allergic rhinitis, unspecified: Secondary | ICD-10-CM | POA: Diagnosis not present

## 2012-07-08 ENCOUNTER — Other Ambulatory Visit: Payer: Self-pay | Admitting: Family Medicine

## 2012-07-15 DIAGNOSIS — E119 Type 2 diabetes mellitus without complications: Secondary | ICD-10-CM | POA: Diagnosis not present

## 2012-07-15 DIAGNOSIS — H40029 Open angle with borderline findings, high risk, unspecified eye: Secondary | ICD-10-CM | POA: Diagnosis not present

## 2012-07-15 DIAGNOSIS — H2510 Age-related nuclear cataract, unspecified eye: Secondary | ICD-10-CM | POA: Diagnosis not present

## 2012-07-28 DIAGNOSIS — J309 Allergic rhinitis, unspecified: Secondary | ICD-10-CM | POA: Diagnosis not present

## 2012-08-03 DIAGNOSIS — J309 Allergic rhinitis, unspecified: Secondary | ICD-10-CM | POA: Diagnosis not present

## 2012-08-04 ENCOUNTER — Other Ambulatory Visit: Payer: Self-pay | Admitting: Family Medicine

## 2012-08-05 DIAGNOSIS — J309 Allergic rhinitis, unspecified: Secondary | ICD-10-CM | POA: Diagnosis not present

## 2012-08-10 DIAGNOSIS — J309 Allergic rhinitis, unspecified: Secondary | ICD-10-CM | POA: Diagnosis not present

## 2012-08-12 DIAGNOSIS — J309 Allergic rhinitis, unspecified: Secondary | ICD-10-CM | POA: Diagnosis not present

## 2012-09-01 DIAGNOSIS — H40029 Open angle with borderline findings, high risk, unspecified eye: Secondary | ICD-10-CM | POA: Diagnosis not present

## 2012-09-08 DIAGNOSIS — J309 Allergic rhinitis, unspecified: Secondary | ICD-10-CM | POA: Diagnosis not present

## 2012-10-07 ENCOUNTER — Telehealth: Payer: Self-pay | Admitting: *Deleted

## 2012-10-07 DIAGNOSIS — J309 Allergic rhinitis, unspecified: Secondary | ICD-10-CM | POA: Diagnosis not present

## 2012-10-07 MED ORDER — FREESTYLE LANCETS MISC: Status: DC

## 2012-10-07 NOTE — Telephone Encounter (Signed)
Rx sent 

## 2012-10-12 ENCOUNTER — Other Ambulatory Visit: Payer: Self-pay

## 2012-10-12 MED ORDER — LEVOTHYROXINE SODIUM 112 MCG PO TABS: ORAL_TABLET | ORAL | Status: DC

## 2012-10-12 MED ORDER — AMLODIPINE BESYLATE 5 MG PO TABS: ORAL_TABLET | ORAL | Status: DC

## 2012-10-27 DIAGNOSIS — J3089 Other allergic rhinitis: Secondary | ICD-10-CM | POA: Diagnosis not present

## 2012-10-27 DIAGNOSIS — J3081 Allergic rhinitis due to animal (cat) (dog) hair and dander: Secondary | ICD-10-CM | POA: Diagnosis not present

## 2012-10-27 DIAGNOSIS — J301 Allergic rhinitis due to pollen: Secondary | ICD-10-CM | POA: Diagnosis not present

## 2012-10-27 DIAGNOSIS — J45909 Unspecified asthma, uncomplicated: Secondary | ICD-10-CM | POA: Diagnosis not present

## 2012-10-29 ENCOUNTER — Other Ambulatory Visit: Payer: Self-pay

## 2012-11-04 DIAGNOSIS — J309 Allergic rhinitis, unspecified: Secondary | ICD-10-CM | POA: Diagnosis not present

## 2012-11-05 DIAGNOSIS — J309 Allergic rhinitis, unspecified: Secondary | ICD-10-CM | POA: Diagnosis not present

## 2012-11-09 ENCOUNTER — Other Ambulatory Visit: Payer: Self-pay | Admitting: *Deleted

## 2012-11-09 MED ORDER — PRAVASTATIN SODIUM 10 MG PO TABS: ORAL_TABLET | ORAL | Status: DC

## 2012-11-09 MED ORDER — METFORMIN HCL 500 MG PO TABS: ORAL_TABLET | ORAL | Status: DC

## 2012-11-09 NOTE — Telephone Encounter (Signed)
Rx refilled for pravastatin 10 mg 11-09-12.  Patient has a pending appointment.

## 2012-11-09 NOTE — Telephone Encounter (Signed)
Rx refilled for metformin 500 mg 11-09-12 for 90 with 1 refill patient has pending appointment.

## 2012-12-02 DIAGNOSIS — J309 Allergic rhinitis, unspecified: Secondary | ICD-10-CM | POA: Diagnosis not present

## 2012-12-04 DIAGNOSIS — J309 Allergic rhinitis, unspecified: Secondary | ICD-10-CM | POA: Diagnosis not present

## 2012-12-07 DIAGNOSIS — J309 Allergic rhinitis, unspecified: Secondary | ICD-10-CM | POA: Diagnosis not present

## 2012-12-10 DIAGNOSIS — J309 Allergic rhinitis, unspecified: Secondary | ICD-10-CM | POA: Diagnosis not present

## 2012-12-14 DIAGNOSIS — J309 Allergic rhinitis, unspecified: Secondary | ICD-10-CM | POA: Diagnosis not present

## 2012-12-25 ENCOUNTER — Ambulatory Visit (INDEPENDENT_AMBULATORY_CARE_PROVIDER_SITE_OTHER): Payer: Medicare Other | Admitting: Family Medicine

## 2012-12-25 ENCOUNTER — Encounter: Payer: Self-pay | Admitting: Family Medicine

## 2012-12-25 VITALS — BP 128/84 | HR 82 | Temp 98.5°F | Ht 61.75 in | Wt 148.6 lb

## 2012-12-25 DIAGNOSIS — E039 Hypothyroidism, unspecified: Secondary | ICD-10-CM

## 2012-12-25 DIAGNOSIS — E119 Type 2 diabetes mellitus without complications: Secondary | ICD-10-CM

## 2012-12-25 DIAGNOSIS — Z0000 Encounter for general adult medical examination without abnormal findings: Secondary | ICD-10-CM | POA: Diagnosis not present

## 2012-12-25 DIAGNOSIS — E1159 Type 2 diabetes mellitus with other circulatory complications: Secondary | ICD-10-CM

## 2012-12-25 DIAGNOSIS — M899 Disorder of bone, unspecified: Secondary | ICD-10-CM

## 2012-12-25 DIAGNOSIS — Z23 Encounter for immunization: Secondary | ICD-10-CM | POA: Diagnosis not present

## 2012-12-25 DIAGNOSIS — I10 Essential (primary) hypertension: Secondary | ICD-10-CM

## 2012-12-25 LAB — POCT URINALYSIS DIPSTICK
Glucose, UA: NEGATIVE
Ketones, UA: NEGATIVE
Leukocytes, UA: NEGATIVE
Protein, UA: NEGATIVE
Spec Grav, UA: <=1.005
Urobilinogen, UA: 0.2

## 2012-12-25 LAB — LIPID PANEL
Cholesterol: 129 mg/dL (ref 0–200)
LDL Cholesterol: 51 mg/dL (ref 0–99)
Triglycerides: 138 mg/dL (ref 0.0–149.0)
VLDL: 27.6 mg/dL (ref 0.0–40.0)

## 2012-12-25 LAB — HEPATIC FUNCTION PANEL
AST: 25 U/L (ref 0–37)
Albumin: 4.7 g/dL (ref 3.5–5.2)
Total Bilirubin: 0.7 mg/dL (ref 0.3–1.2)

## 2012-12-25 LAB — BASIC METABOLIC PANEL
Calcium: 9.4 mg/dL (ref 8.4–10.5)
Creatinine, Ser: 0.8 mg/dL (ref 0.4–1.2)

## 2012-12-25 LAB — MICROALBUMIN / CREATININE URINE RATIO
Creatinine,U: 31 mg/dL
Microalb Creat Ratio: 3.2 mg/g (ref 0.0–30.0)

## 2012-12-25 LAB — HEMOGLOBIN A1C: Hgb A1c MFr Bld: 7.3 % — ABNORMAL HIGH (ref 4.6–6.5)

## 2012-12-25 MED ORDER — METFORMIN HCL 500 MG PO TABS: ORAL_TABLET | ORAL | Status: DC

## 2012-12-25 MED ORDER — FREESTYLE LANCETS MISC: Status: DC

## 2012-12-25 MED ORDER — AMLODIPINE BESYLATE 5 MG PO TABS: ORAL_TABLET | ORAL | Status: DC

## 2012-12-25 MED ORDER — LEVOTHYROXINE SODIUM 112 MCG PO TABS: ORAL_TABLET | ORAL | Status: DC

## 2012-12-25 NOTE — Assessment & Plan Note (Signed)
Check labs con't meds 

## 2012-12-25 NOTE — Assessment & Plan Note (Signed)
bmd due next year

## 2012-12-25 NOTE — Patient Instructions (Signed)
Preventive Care for Adults, Female A healthy lifestyle and preventive care can promote health and wellness. Preventive health guidelines for women include the following key practices.  A routine yearly physical is a good way to check with your caregiver about your health and preventive screening. It is a chance to share any concerns and updates on your health, and to receive a thorough exam.  Visit your dentist for a routine exam and preventive care every 6 months. Brush your teeth twice a day and floss once a day. Good oral hygiene prevents tooth decay and gum disease.  The frequency of eye exams is based on your age, health, family medical history, use of contact lenses, and other factors. Follow your caregiver's recommendations for frequency of eye exams.  Eat a healthy diet. Foods like vegetables, fruits, whole grains, low-fat dairy products, and lean protein foods contain the nutrients you need without too many calories. Decrease your intake of foods high in solid fats, added sugars, and salt. Eat the right amount of calories for you.Get information about a proper diet from your caregiver, if necessary.  Regular physical exercise is one of the most important things you can do for your health. Most adults should get at least 150 minutes of moderate-intensity exercise (any activity that increases your heart rate and causes you to sweat) each week. In addition, most adults need muscle-strengthening exercises on 2 or more days a week.  Maintain a healthy weight. The body mass index (BMI) is a screening tool to identify possible weight problems. It provides an estimate of body fat based on height and weight. Your caregiver can help determine your BMI, and can help you achieve or maintain a healthy weight.For adults 20 years and older:  A BMI below 18.5 is considered underweight.  A BMI of 18.5 to 24.9 is normal.  A BMI of 25 to 29.9 is considered overweight.  A BMI of 30 and above is  considered obese.  Maintain normal blood lipids and cholesterol levels by exercising and minimizing your intake of saturated fat. Eat a balanced diet with plenty of fruit and vegetables. Blood tests for lipids and cholesterol should begin at age 20 and be repeated every 5 years. If your lipid or cholesterol levels are high, you are over 50, or you are at high risk for heart disease, you may need your cholesterol levels checked more frequently.Ongoing high lipid and cholesterol levels should be treated with medicines if diet and exercise are not effective.  If you smoke, find out from your caregiver how to quit. If you do not use tobacco, do not start.  If you are pregnant, do not drink alcohol. If you are breastfeeding, be very cautious about drinking alcohol. If you are not pregnant and choose to drink alcohol, do not exceed 1 drink per day. One drink is considered to be 12 ounces (355 mL) of beer, 5 ounces (148 mL) of wine, or 1.5 ounces (44 mL) of liquor.  Avoid use of street drugs. Do not share needles with anyone. Ask for help if you need support or instructions about stopping the use of drugs.  High blood pressure causes heart disease and increases the risk of stroke. Your blood pressure should be checked at least every 1 to 2 years. Ongoing high blood pressure should be treated with medicines if weight loss and exercise are not effective.  If you are 55 to 69 years old, ask your caregiver if you should take aspirin to prevent strokes.  Diabetes   screening involves taking a blood sample to check your fasting blood sugar level. This should be done once every 3 years, after age 45, if you are within normal weight and without risk factors for diabetes. Testing should be considered at a younger age or be carried out more frequently if you are overweight and have at least 1 risk factor for diabetes.  Breast cancer screening is essential preventive care for women. You should practice "breast  self-awareness." This means understanding the normal appearance and feel of your breasts and may include breast self-examination. Any changes detected, no matter how small, should be reported to a caregiver. Women in their 20s and 30s should have a clinical breast exam (CBE) by a caregiver as part of a regular health exam every 1 to 3 years. After age 40, women should have a CBE every year. Starting at age 40, women should consider having a mammography (breast X-ray test) every year. Women who have a family history of breast cancer should talk to their caregiver about genetic screening. Women at a high risk of breast cancer should talk to their caregivers about having magnetic resonance imaging (MRI) and a mammography every year.  The Pap test is a screening test for cervical cancer. A Pap test can show cell changes on the cervix that might become cervical cancer if left untreated. A Pap test is a procedure in which cells are obtained and examined from the lower end of the uterus (cervix).  Women should have a Pap test starting at age 21.  Between ages 21 and 29, Pap tests should be repeated every 2 years.  Beginning at age 30, you should have a Pap test every 3 years as long as the past 3 Pap tests have been normal.  Some women have medical problems that increase the chance of getting cervical cancer. Talk to your caregiver about these problems. It is especially important to talk to your caregiver if a new problem develops soon after your last Pap test. In these cases, your caregiver may recommend more frequent screening and Pap tests.  The above recommendations are the same for women who have or have not gotten the vaccine for human papillomavirus (HPV).  If you had a hysterectomy for a problem that was not cancer or a condition that could lead to cancer, then you no longer need Pap tests. Even if you no longer need a Pap test, a regular exam is a good idea to make sure no other problems are  starting.  If you are between ages 65 and 70, and you have had normal Pap tests going back 10 years, you no longer need Pap tests. Even if you no longer need a Pap test, a regular exam is a good idea to make sure no other problems are starting.  If you have had past treatment for cervical cancer or a condition that could lead to cancer, you need Pap tests and screening for cancer for at least 20 years after your treatment.  If Pap tests have been discontinued, risk factors (such as a new sexual partner) need to be reassessed to determine if screening should be resumed.  The HPV test is an additional test that may be used for cervical cancer screening. The HPV test looks for the virus that can cause the cell changes on the cervix. The cells collected during the Pap test can be tested for HPV. The HPV test could be used to screen women aged 30 years and older, and should   be used in women of any age who have unclear Pap test results. After the age of 30, women should have HPV testing at the same frequency as a Pap test.  Colorectal cancer can be detected and often prevented. Most routine colorectal cancer screening begins at the age of 50 and continues through age 75. However, your caregiver may recommend screening at an earlier age if you have risk factors for colon cancer. On a yearly basis, your caregiver may provide home test kits to check for hidden blood in the stool. Use of a small camera at the end of a tube, to directly examine the colon (sigmoidoscopy or colonoscopy), can detect the earliest forms of colorectal cancer. Talk to your caregiver about this at age 50, when routine screening begins. Direct examination of the colon should be repeated every 5 to 10 years through age 75, unless early forms of pre-cancerous polyps or small growths are found.  Hepatitis C blood testing is recommended for all people born from 1945 through 1965 and any individual with known risks for hepatitis C.  Practice  safe sex. Use condoms and avoid high-risk sexual practices to reduce the spread of sexually transmitted infections (STIs). STIs include gonorrhea, chlamydia, syphilis, trichomonas, herpes, HPV, and human immunodeficiency virus (HIV). Herpes, HIV, and HPV are viral illnesses that have no cure. They can result in disability, cancer, and death. Sexually active women aged 25 and younger should be checked for chlamydia. Older women with new or multiple partners should also be tested for chlamydia. Testing for other STIs is recommended if you are sexually active and at increased risk.  Osteoporosis is a disease in which the bones lose minerals and strength with aging. This can result in serious bone fractures. The risk of osteoporosis can be identified using a bone density scan. Women ages 65 and over and women at risk for fractures or osteoporosis should discuss screening with their caregivers. Ask your caregiver whether you should take a calcium supplement or vitamin D to reduce the rate of osteoporosis.  Menopause can be associated with physical symptoms and risks. Hormone replacement therapy is available to decrease symptoms and risks. You should talk to your caregiver about whether hormone replacement therapy is right for you.  Use sunscreen with sun protection factor (SPF) of 30 or more. Apply sunscreen liberally and repeatedly throughout the day. You should seek shade when your shadow is shorter than you. Protect yourself by wearing long sleeves, pants, a wide-brimmed hat, and sunglasses year round, whenever you are outdoors.  Once a month, do a whole body skin exam, using a mirror to look at the skin on your back. Notify your caregiver of new moles, moles that have irregular borders, moles that are larger than a pencil eraser, or moles that have changed in shape or color.  Stay current with required immunizations.  Influenza. You need a dose every fall (or winter). The composition of the flu vaccine  changes each year, so being vaccinated once is not enough.  Pneumococcal polysaccharide. You need 1 to 2 doses if you smoke cigarettes or if you have certain chronic medical conditions. You need 1 dose at age 65 (or older) if you have never been vaccinated.  Tetanus, diphtheria, pertussis (Tdap, Td). Get 1 dose of Tdap vaccine if you are younger than age 65, are over 65 and have contact with an infant, are a healthcare worker, are pregnant, or simply want to be protected from whooping cough. After that, you need a Td   booster dose every 10 years. Consult your caregiver if you have not had at least 3 tetanus and diphtheria-containing shots sometime in your life or have a deep or dirty wound.  HPV. You need this vaccine if you are a woman age 26 or younger. The vaccine is given in 3 doses over 6 months.  Measles, mumps, rubella (MMR). You need at least 1 dose of MMR if you were born in 1957 or later. You may also need a second dose.  Meningococcal. If you are age 19 to 21 and a first-year college student living in a residence hall, or have one of several medical conditions, you need to get vaccinated against meningococcal disease. You may also need additional booster doses.  Zoster (shingles). If you are age 60 or older, you should get this vaccine.  Varicella (chickenpox). If you have never had chickenpox or you were vaccinated but received only 1 dose, talk to your caregiver to find out if you need this vaccine.  Hepatitis A. You need this vaccine if you have a specific risk factor for hepatitis A virus infection or you simply wish to be protected from this disease. The vaccine is usually given as 2 doses, 6 to 18 months apart.  Hepatitis B. You need this vaccine if you have a specific risk factor for hepatitis B virus infection or you simply wish to be protected from this disease. The vaccine is given in 3 doses, usually over 6 months. Preventive Services / Frequency Ages 19 to 39  Blood  pressure check.** / Every 1 to 2 years.  Lipid and cholesterol check.** / Every 5 years beginning at age 20.  Clinical breast exam.** / Every 3 years for women in their 20s and 30s.  Pap test.** / Every 2 years from ages 21 through 29. Every 3 years starting at age 30 through age 65 or 70 with a history of 3 consecutive normal Pap tests.  HPV screening.** / Every 3 years from ages 30 through ages 65 to 70 with a history of 3 consecutive normal Pap tests.  Hepatitis C blood test.** / For any individual with known risks for hepatitis C.  Skin self-exam. / Monthly.  Influenza immunization.** / Every year.  Pneumococcal polysaccharide immunization.** / 1 to 2 doses if you smoke cigarettes or if you have certain chronic medical conditions.  Tetanus, diphtheria, pertussis (Tdap, Td) immunization. / A one-time dose of Tdap vaccine. After that, you need a Td booster dose every 10 years.  HPV immunization. / 3 doses over 6 months, if you are 26 and younger.  Measles, mumps, rubella (MMR) immunization. / You need at least 1 dose of MMR if you were born in 1957 or later. You may also need a second dose.  Meningococcal immunization. / 1 dose if you are age 19 to 21 and a first-year college student living in a residence hall, or have one of several medical conditions, you need to get vaccinated against meningococcal disease. You may also need additional booster doses.  Varicella immunization.** / Consult your caregiver.  Hepatitis A immunization.** / Consult your caregiver. 2 doses, 6 to 18 months apart.  Hepatitis B immunization.** / Consult your caregiver. 3 doses usually over 6 months. Ages 40 to 64  Blood pressure check.** / Every 1 to 2 years.  Lipid and cholesterol check.** / Every 5 years beginning at age 20.  Clinical breast exam.** / Every year after age 40.  Mammogram.** / Every year beginning at age 40   and continuing for as long as you are in good health. Consult with your  caregiver.  Pap test.** / Every 3 years starting at age 30 through age 65 or 70 with a history of 3 consecutive normal Pap tests.  HPV screening.** / Every 3 years from ages 30 through ages 65 to 70 with a history of 3 consecutive normal Pap tests.  Fecal occult blood test (FOBT) of stool. / Every year beginning at age 50 and continuing until age 75. You may not need to do this test if you get a colonoscopy every 10 years.  Flexible sigmoidoscopy or colonoscopy.** / Every 5 years for a flexible sigmoidoscopy or every 10 years for a colonoscopy beginning at age 50 and continuing until age 75.  Hepatitis C blood test.** / For all people born from 1945 through 1965 and any individual with known risks for hepatitis C.  Skin self-exam. / Monthly.  Influenza immunization.** / Every year.  Pneumococcal polysaccharide immunization.** / 1 to 2 doses if you smoke cigarettes or if you have certain chronic medical conditions.  Tetanus, diphtheria, pertussis (Tdap, Td) immunization.** / A one-time dose of Tdap vaccine. After that, you need a Td booster dose every 10 years.  Measles, mumps, rubella (MMR) immunization. / You need at least 1 dose of MMR if you were born in 1957 or later. You may also need a second dose.  Varicella immunization.** / Consult your caregiver.  Meningococcal immunization.** / Consult your caregiver.  Hepatitis A immunization.** / Consult your caregiver. 2 doses, 6 to 18 months apart.  Hepatitis B immunization.** / Consult your caregiver. 3 doses, usually over 6 months. Ages 65 and over  Blood pressure check.** / Every 1 to 2 years.  Lipid and cholesterol check.** / Every 5 years beginning at age 20.  Clinical breast exam.** / Every year after age 40.  Mammogram.** / Every year beginning at age 40 and continuing for as long as you are in good health. Consult with your caregiver.  Pap test.** / Every 3 years starting at age 30 through age 65 or 70 with a 3  consecutive normal Pap tests. Testing can be stopped between 65 and 70 with 3 consecutive normal Pap tests and no abnormal Pap or HPV tests in the past 10 years.  HPV screening.** / Every 3 years from ages 30 through ages 65 or 70 with a history of 3 consecutive normal Pap tests. Testing can be stopped between 65 and 70 with 3 consecutive normal Pap tests and no abnormal Pap or HPV tests in the past 10 years.  Fecal occult blood test (FOBT) of stool. / Every year beginning at age 50 and continuing until age 75. You may not need to do this test if you get a colonoscopy every 10 years.  Flexible sigmoidoscopy or colonoscopy.** / Every 5 years for a flexible sigmoidoscopy or every 10 years for a colonoscopy beginning at age 50 and continuing until age 75.  Hepatitis C blood test.** / For all people born from 1945 through 1965 and any individual with known risks for hepatitis C.  Osteoporosis screening.** / A one-time screening for women ages 65 and over and women at risk for fractures or osteoporosis.  Skin self-exam. / Monthly.  Influenza immunization.** / Every year.  Pneumococcal polysaccharide immunization.** / 1 dose at age 65 (or older) if you have never been vaccinated.  Tetanus, diphtheria, pertussis (Tdap, Td) immunization. / A one-time dose of Tdap vaccine if you are over   65 and have contact with an infant, are a healthcare worker, or simply want to be protected from whooping cough. After that, you need a Td booster dose every 10 years.  Varicella immunization.** / Consult your caregiver.  Meningococcal immunization.** / Consult your caregiver.  Hepatitis A immunization.** / Consult your caregiver. 2 doses, 6 to 18 months apart.  Hepatitis B immunization.** / Check with your caregiver. 3 doses, usually over 6 months. ** Family history and personal history of risk and conditions may change your caregiver's recommendations. Document Released: 06/04/2001 Document Revised: 07/01/2011  Document Reviewed: 09/03/2010 [employer] Patient Information 2014 [employer]  

## 2012-12-25 NOTE — Progress Notes (Signed)
Subjective:    Cheryl Cisneros is a 67 y.o. female who presents for Medicare Annual/Subsequent preventive examination.  Preventive Screening-Counseling & Management  Tobacco History  Smoking status  . Never Smoker   Smokeless tobacco  . Never Used     Problems Prior to Visit 1. Knee pain-- seeing ortho  Current Problems (verified) Patient Active Problem List   Diagnosis Date Noted  . Contact dermatitis due to plants, except food 08/06/2010  . MOLE 12/14/2009  . 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 OSTEOPENIA 03/27/2009  . DIABETES MELLITUS, TYPE II 01/13/2009  . UTI 01/09/2009  . ARTIFICIAL MENOPAUSE 12/13/2008  . INFLUENZA WITH OTHER RESPIRATORY MANIFESTATIONS 03/07/2008  . UNSPECIFIED GLAUCOMA 12/29/2007  . OTHER CATARACT 12/29/2007  . MITRAL REGURGITATION, MODERATE 12/29/2007  . HYPOTHYROIDISM 10/27/2006  . ALLERGIC RHINITIS 10/27/2006  . ASTHMA 10/27/2006  . GERD 10/27/2006  . HEADACHE 10/27/2006  . NEPHROLITHIASIS, HX OF 10/27/2006  . Other Postprocedural Status 10/27/2006  . HYPERTENSION 07/29/2006    Medications Prior to Visit Current Outpatient Prescriptions on File Prior to Visit  Medication Sig Dispense Refill  . AMBULATORY NON FORMULARY MEDICATION Diabetic Shoes to use daily. Dx 250.00  2 each  0  . aspirin 81 MG tablet Take 81 mg by mouth daily.        . Calcium-Vitamin D 600-200 MG-UNIT per tablet Take 1 tablet by mouth 3 (three) times daily with meals.        . cetirizine (ZYRTEC) 10 MG tablet TAKE ONE TABLET BY MOUTH EVERY DAY  90 tablet  3  . Cholecalciferol (VITAMIN D3) 1000 UNITS CAPS Take 1 capsule by mouth daily.        Marland Kitchen EPIPEN 2-PAK 0.3 MG/0.3ML DEVI       . Garlic Oil (SUPER GARLIC) 123XX123 MG CAPS Take 2 capsules by mouth daily.        Marland Kitchen L-Lysine 500 MG TABS Take 1 tablet by mouth daily.        . LUTEIN-ZEAXANTHIN PO Take 1 tablet by mouth daily.        . Magnesium 250 MG TABS Take 1 tablet by mouth 2 (two) times daily.        . Methylcellulose,  Laxative, (FIBER THERAPY PO) Take 2 tablets by mouth at bedtime as needed.        . Multiple Vitamins-Minerals (CENTRUM SILVER PO) Take 1 tablet by mouth daily.        . naproxen sodium (ANAPROX) 220 MG tablet Take 220 mg by mouth 2 (two) times daily with a meal.        . Omega-3 Fatty Acids (OMEGA 3 PO) Take 1 tablet by mouth daily.        Marland Kitchen omeprazole (PRILOSEC) 40 MG capsule TAKE ONE CAPSULE BY MOUTH EVERY DAY  90 capsule  3  . ONE TOUCH ULTRA TEST test strip CHECK BLOOD SUGAR TWICE DAILY  100 each  12  . pravastatin (PRAVACHOL) 10 MG tablet 1 tab by mouth daily---labs are due now  90 tablet  0  . SIMPLY SALINE NA Place 1 spray into the nose daily.         No current facility-administered medications on file prior to visit.    Current Medications (verified) Current Outpatient Prescriptions  Medication Sig Dispense Refill  . AMBULATORY NON FORMULARY MEDICATION Diabetic Shoes to use daily. Dx 250.00  2 each  0  . amLODipine (NORVASC) 5 MG tablet TAKE ONE TABLET BY MOUTH EVERY DAY--office visit due now  90 tablet  3  . aspirin 81 MG tablet Take 81 mg by mouth daily.        . Biotin 1000 MCG tablet Take 1,000 mcg by mouth 2 (two) times daily.      . Calcium-Vitamin D 600-200 MG-UNIT per tablet Take 1 tablet by mouth 3 (three) times daily with meals.        . cetirizine (ZYRTEC) 10 MG tablet TAKE ONE TABLET BY MOUTH EVERY DAY  90 tablet  3  . Cholecalciferol (VITAMIN D3) 1000 UNITS CAPS Take 1 capsule by mouth daily.        Marland Kitchen EPIPEN 2-PAK 0.3 MG/0.3ML DEVI       . Garlic Oil (SUPER GARLIC) 123XX123 MG CAPS Take 2 capsules by mouth daily.        Marland Kitchen L-Lysine 500 MG TABS Take 1 tablet by mouth daily.        . Lancets (FREESTYLE) lancets Test bid Dx. 250.00  200 each  12  . levothyroxine (SYNTHROID, LEVOTHROID) 112 MCG tablet TAKE ONE TABLET BY MOUTH EVERY DAY  90 tablet  3  . LUTEIN-ZEAXANTHIN PO Take 1 tablet by mouth daily.        . Magnesium 250 MG TABS Take 1 tablet by mouth 2 (two) times  daily.        . metFORMIN (GLUCOPHAGE) 500 MG tablet TAKE ONE TABLET BY MOUTH TWICE DAILY WITH MEALS  180 tablet  3  . Methylcellulose, Laxative, (FIBER THERAPY PO) Take 2 tablets by mouth at bedtime as needed.        . Multiple Vitamins-Minerals (CENTRUM SILVER PO) Take 1 tablet by mouth daily.        . naproxen sodium (ANAPROX) 220 MG tablet Take 220 mg by mouth 2 (two) times daily with a meal.        . Omega-3 Fatty Acids (OMEGA 3 PO) Take 1 tablet by mouth daily.        Marland Kitchen omeprazole (PRILOSEC) 40 MG capsule TAKE ONE CAPSULE BY MOUTH EVERY DAY  90 capsule  3  . ONE TOUCH ULTRA TEST test strip CHECK BLOOD SUGAR TWICE DAILY  100 each  12  . pravastatin (PRAVACHOL) 10 MG tablet 1 tab by mouth daily---labs are due now  90 tablet  0  . SIMPLY SALINE NA Place 1 spray into the nose daily.         No current facility-administered medications for this visit.     Allergies (verified) Review of patient's allergies indicates no known allergies.   PAST HISTORY  Family History Family History  Problem Relation Age of Onset  . Coronary artery disease    . Diabetes Mother   . Hypertension Mother   . Kidney disease Mother     dialysis  . Lung cancer    . Diabetes Father   . Hypertension Father   . Cancer Father     lung  . Dementia Sister     Social History History  Substance Use Topics  . Smoking status: Never Smoker   . Smokeless tobacco: Never Used  . Alcohol Use: No     Are there smokers in your home (other than you)? No  Risk Factors Current exercise habits: Gym/ health club routine includes boot camp and walking.  Dietary issues discussed: na   Cardiac risk factors: advanced age (older than 52 for men, 30 for women), dyslipidemia and hypertension.  Depression Screen (Note: if answer to either of the following is "Yes", a  more complete depression screening is indicated)   Over the past two weeks, have you felt down, depressed or hopeless? No  Over the past two weeks, have  you felt little interest or pleasure in doing things? No  Have you lost interest or pleasure in daily life? No  Do you often feel hopeless? No  Do you cry easily over simple problems? No  Activities of Daily Living In your present state of health, do you have any difficulty performing the following activities?:  Driving? No Managing money?  No Feeding yourself? No Getting from bed to chair? No Climbing a flight of stairs? No Preparing food and eating?: No Bathing or showering? No Getting dressed: No Getting to the toilet? No Using the toilet:No Moving around from place to place: No In the past year have you fallen or had a near fall?:No   Are you sexually active?  Yes  Do you have more than one partner?  No  Hearing Difficulties: No Do you often ask people to speak up or repeat themselves? No Do you experience ringing or noises in your ears? No Do you have difficulty understanding soft or whispered voices? No   Do you feel that you have a problem with memory? No  Do you often misplace items? No  Do you feel safe at home?  Yes  Cognitive Testing  Alert? Yes  Normal Appearance?Yes  Oriented to person? Yes  Place? Yes   Time? Yes  Recall of three objects?  Yes  Can perform simple calculations? Yes  Displays appropriate judgment?Yes  Can read the correct time from a watch face?Yes   Advanced Directives have been discussed with the patient? Yes  List the Names of Other Physician/Practitioners you currently use: 1.  opth--sherman thurman 2  Dentist- [place] 3 allergiest--[place]  Indicate any recent Medical Services you may have received from other than [facility] providers in the past year (date may be approximate).  Immunization History  Administered Date(s) Administered  . Influenza Split 02/12/2011  . Influenza Whole 02/01/2009  . Pneumococcal Polysaccharide 12/29/2007, 12/13/2008  . Td 09/16/2001  . Zoster 11/12/2007    Screening Tests Health Maintenance  Topic  Date Due  . Tetanus/tdap  09/17/2011  . Hemoglobin A1c  06/23/2012  . Ophthalmology Exam  09/23/2012  . Influenza Vaccine  11/20/2012  . Foot Exam  12/24/2012  . Urine Microalbumin  12/24/2012  . Mammogram  02/11/2014  . Colonoscopy  04/04/2015  . Pneumococcal Polysaccharide Vaccine Age 51 And Over  Completed  . Zostavax  Completed    All answers were reviewed with the patient and necessary referrals were made:  Garnet Koyanagi, DO   12/25/2012   History reviewed:  She  has a past medical history of Hypertension; Allergy; GERD (gastroesophageal reflux disease); Thyroid disease; Asthma; Macular degeneration (12/25/2011); and Cataract. She  does not have any pertinent problems on file. She  has past surgical history that includes Tonsillectomy; Abdominal hysterectomy (1986); Knee arthroscopy; Lumbar laminectomy (1999); and Rotator cuff repair (02/27/2005). Her family history includes Cancer in her father; Coronary artery disease in an other family member; Dementia in her sister; Diabetes in her father and mother; Hypertension in her father and mother; Kidney disease in her mother; Lung cancer in an other family member. She  reports that she has never smoked. She has never used smokeless tobacco. She reports that she does not drink alcohol or use illicit drugs. She has a current medication list which includes the following prescription(s): AMBULATORY NON  FORMULARY MEDICATION, amlodipine, aspirin, biotin, calcium-vitamin d, cetirizine, vitamin d3, epipen 2-pak, garlic oil, l-lysine, freestyle, levothyroxine, lutein-zeaxanthin, magnesium, metformin, methylcellulose (laxative), multiple vitamins-minerals, naproxen sodium, omega-3 fatty acids, omeprazole, one touch ultra test, pravastatin, and saline. Current Outpatient Prescriptions on File Prior to Visit  Medication Sig Dispense Refill  . AMBULATORY NON FORMULARY MEDICATION Diabetic Shoes to use daily. Dx 250.00  2 each  0  . aspirin 81 MG tablet Take  81 mg by mouth daily.        . Calcium-Vitamin D 600-200 MG-UNIT per tablet Take 1 tablet by mouth 3 (three) times daily with meals.        . cetirizine (ZYRTEC) 10 MG tablet TAKE ONE TABLET BY MOUTH EVERY DAY  90 tablet  3  . Cholecalciferol (VITAMIN D3) 1000 UNITS CAPS Take 1 capsule by mouth daily.        Marland Kitchen EPIPEN 2-PAK 0.3 MG/0.3ML DEVI       . Garlic Oil (SUPER GARLIC) 123XX123 MG CAPS Take 2 capsules by mouth daily.        Marland Kitchen L-Lysine 500 MG TABS Take 1 tablet by mouth daily.        . LUTEIN-ZEAXANTHIN PO Take 1 tablet by mouth daily.        . Magnesium 250 MG TABS Take 1 tablet by mouth 2 (two) times daily.        . Methylcellulose, Laxative, (FIBER THERAPY PO) Take 2 tablets by mouth at bedtime as needed.        . Multiple Vitamins-Minerals (CENTRUM SILVER PO) Take 1 tablet by mouth daily.        . naproxen sodium (ANAPROX) 220 MG tablet Take 220 mg by mouth 2 (two) times daily with a meal.        . Omega-3 Fatty Acids (OMEGA 3 PO) Take 1 tablet by mouth daily.        Marland Kitchen omeprazole (PRILOSEC) 40 MG capsule TAKE ONE CAPSULE BY MOUTH EVERY DAY  90 capsule  3  . ONE TOUCH ULTRA TEST test strip CHECK BLOOD SUGAR TWICE DAILY  100 each  12  . pravastatin (PRAVACHOL) 10 MG tablet 1 tab by mouth daily---labs are due now  90 tablet  0  . SIMPLY SALINE NA Place 1 spray into the nose daily.         No current facility-administered medications on file prior to visit.   She has No Known Allergies.  Review of Systems  Review of Systems  Constitutional: Negative for activity change, appetite change and fatigue.  HENT: Negative for hearing loss, congestion, tinnitus and ear discharge.   Eyes: Negative for visual disturbance (see optho q1y -- vision corrected to 20/20 with glasses).  Respiratory: Negative for cough, chest tightness and shortness of breath.   Cardiovascular: Negative for chest pain, palpitations and leg swelling.  Gastrointestinal: Negative for abdominal pain, diarrhea, constipation  and abdominal distention.  Genitourinary: Negative for urgency, frequency, decreased urine volume and difficulty urinating.  Musculoskeletal: Negative for back pain, arthralgias and gait problem.  Skin: Negative for color change, pallor and rash.  Neurological: Negative for dizziness, light-headedness, numbness and headaches.  Hematological: Negative for adenopathy. Does not bruise/bleed easily.  Psychiatric/Behavioral: Negative for suicidal ideas, confusion, sleep disturbance, self-injury, dysphoric mood, decreased concentration and agitation.  Pt is able to read and write and can do all ADLs No risk for falling No abuse/ violence in home      Objective:     Vision by Snellen chart:  opth  Body mass index is 27.42 kg/(m^2). BP 128/84  Pulse 82  Temp(Src) 98.5 F (36.9 C) (Oral)  Ht 5' 1.75" (1.568 m)  Wt 148 lb 9.6 oz (67.405 kg)  BMI 27.42 kg/m2  SpO2 98%  BP 128/84  Pulse 82  Temp(Src) 98.5 F (36.9 C) (Oral)  Ht 5' 1.75" (1.568 m)  Wt 148 lb 9.6 oz (67.405 kg)  BMI 27.42 kg/m2  SpO2 98% General appearance: alert, cooperative, appears stated age and no distress Head: Normocephalic, without obvious abnormality, atraumatic Eyes: conjunctivae/corneas clear. PERRL, EOM's intact. Fundi benign. Ears: normal TM's and external ear canals both ears Nose: Nares normal. Septum midline. Mucosa normal. No drainage or sinus tenderness. Throat: lips, mucosa, and tongue normal; teeth and gums normal Neck: no adenopathy, no carotid bruit, no JVD, supple, symmetrical, trachea midline and thyroid not enlarged, symmetric, no tenderness/mass/nodules Back: symmetric, no curvature. ROM normal. No CVA tenderness. Lungs: clear to auscultation bilaterally Breasts: normal appearance, no masses or tenderness Heart: regular rate and rhythm, S1, S2 normal, no murmur, click, rub or gallop Abdomen: soft, non-tender; bowel sounds normal; no masses,  no organomegaly Pelvic: not indicated;  post-menopausal, no abnormal Pap smears in past Extremities: extremities normal, atraumatic, no cyanosis or edema Pulses: 2+ and symmetric Skin: Skin color, texture, turgor normal. No rashes or lesions Lymph nodes: Cervical, supraclavicular, and axillary nodes normal. Neurologic: Alert and oriented X 3, normal strength and tone. Normal symmetric reflexes. Normal coordination and gait Psych-  No depression, no anxiety      Assessment:     cpe     Plan:    check labs ghm utd During the course of the visit the patient was educated and counseled about appropriate screening and preventive services including:    Pneumococcal vaccine   Influenza vaccine  Td vaccine  Screening mammography  Bone densitometry screening  Colorectal cancer screening  Diabetes screening  Glaucoma screening  Advanced directives: has an advanced directive - a copy HAS NOT been provided.  Diet review for nutrition referral? Yes ____  Not Indicated __x__   Patient Instructions (the written plan) was given to the patient.  Medicare Attestation I have personally reviewed: The patient's medical and social history Their use of alcohol, tobacco or illicit drugs Their current medications and supplements The patient's functional ability including ADLs,fall risks, home safety risks, cognitive, and hearing and visual impairment Diet and physical activities Evidence for depression or mood disorders  The patient's weight, height, BMI, and visual acuity have been recorded in the chart.  I have made referrals, counseling, and provided education to the patient based on review of the above and I have provided the patient with a written personalized care plan for preventive services.     Garnet Koyanagi, DO   12/25/2012

## 2012-12-25 NOTE — Assessment & Plan Note (Signed)
Check labs 

## 2012-12-28 ENCOUNTER — Other Ambulatory Visit: Payer: Self-pay | Admitting: Family Medicine

## 2012-12-28 ENCOUNTER — Other Ambulatory Visit: Payer: Self-pay | Admitting: General Practice

## 2012-12-28 DIAGNOSIS — E1159 Type 2 diabetes mellitus with other circulatory complications: Secondary | ICD-10-CM

## 2012-12-28 DIAGNOSIS — I10 Essential (primary) hypertension: Secondary | ICD-10-CM

## 2012-12-28 DIAGNOSIS — E785 Hyperlipidemia, unspecified: Secondary | ICD-10-CM

## 2012-12-28 DIAGNOSIS — E119 Type 2 diabetes mellitus without complications: Secondary | ICD-10-CM

## 2012-12-28 MED ORDER — METFORMIN HCL 1000 MG PO TABS: 1000.0000 mg | ORAL_TABLET | Freq: Two times a day (BID) | ORAL | Status: DC

## 2013-01-14 DIAGNOSIS — J309 Allergic rhinitis, unspecified: Secondary | ICD-10-CM | POA: Diagnosis not present

## 2013-01-29 DIAGNOSIS — M1710 Unilateral primary osteoarthritis, unspecified knee: Secondary | ICD-10-CM | POA: Diagnosis not present

## 2013-02-09 ENCOUNTER — Other Ambulatory Visit: Payer: Self-pay | Admitting: General Practice

## 2013-02-09 DIAGNOSIS — J309 Allergic rhinitis, unspecified: Secondary | ICD-10-CM | POA: Diagnosis not present

## 2013-02-09 MED ORDER — PRAVASTATIN SODIUM 10 MG PO TABS: ORAL_TABLET | ORAL | Status: DC

## 2013-02-09 NOTE — Telephone Encounter (Signed)
Med filled.  

## 2013-02-12 DIAGNOSIS — Z1231 Encounter for screening mammogram for malignant neoplasm of breast: Secondary | ICD-10-CM | POA: Diagnosis not present

## 2013-02-25 ENCOUNTER — Other Ambulatory Visit: Payer: Self-pay

## 2013-03-10 DIAGNOSIS — J309 Allergic rhinitis, unspecified: Secondary | ICD-10-CM | POA: Diagnosis not present

## 2013-03-30 ENCOUNTER — Other Ambulatory Visit (INDEPENDENT_AMBULATORY_CARE_PROVIDER_SITE_OTHER): Payer: Medicare Other

## 2013-03-30 DIAGNOSIS — E119 Type 2 diabetes mellitus without complications: Secondary | ICD-10-CM | POA: Diagnosis not present

## 2013-03-30 DIAGNOSIS — E785 Hyperlipidemia, unspecified: Secondary | ICD-10-CM | POA: Diagnosis not present

## 2013-03-30 DIAGNOSIS — I10 Essential (primary) hypertension: Secondary | ICD-10-CM | POA: Diagnosis not present

## 2013-03-30 LAB — LIPID PANEL
HDL: 44.7 mg/dL (ref >39.00)
LDL Cholesterol: 64 mg/dL (ref 0–99)
Total CHOL/HDL Ratio: 3
Triglycerides: 85 mg/dL (ref 0.0–149.0)
VLDL: 17 mg/dL (ref 0.0–40.0)

## 2013-03-30 LAB — MICROALBUMIN / CREATININE URINE RATIO
Creatinine,U: 24.1 mg/dL
Microalb Creat Ratio: 2.9 mg/g (ref 0.0–30.0)

## 2013-03-30 LAB — BASIC METABOLIC PANEL
CO2: 25 mEq/L (ref 19–32)
Calcium: 9.1 mg/dL (ref 8.4–10.5)
Chloride: 98 mEq/L (ref 96–112)
GFR: 81.32 mL/min (ref >60.00)
Potassium: 3.5 mEq/L (ref 3.5–5.1)
Sodium: 133 mEq/L — ABNORMAL LOW (ref 135–145)

## 2013-03-30 LAB — HEMOGLOBIN A1C: Hgb A1c MFr Bld: 6.6 % — ABNORMAL HIGH (ref 4.6–6.5)

## 2013-03-30 LAB — HEPATIC FUNCTION PANEL
AST: 21 U/L (ref 0–37)
Albumin: 4.4 g/dL (ref 3.5–5.2)
Total Protein: 7.3 g/dL (ref 6.0–8.3)

## 2013-03-31 ENCOUNTER — Other Ambulatory Visit: Payer: Self-pay | Admitting: Family Medicine

## 2013-04-02 DIAGNOSIS — M899 Disorder of bone, unspecified: Secondary | ICD-10-CM | POA: Diagnosis not present

## 2013-04-05 DIAGNOSIS — J309 Allergic rhinitis, unspecified: Secondary | ICD-10-CM | POA: Diagnosis not present

## 2013-04-23 ENCOUNTER — Telehealth: Payer: Self-pay | Admitting: Family Medicine

## 2013-04-23 MED ORDER — METFORMIN HCL 1000 MG PO TABS: 1000.0000 mg | ORAL_TABLET | Freq: Two times a day (BID) | ORAL | Status: DC

## 2013-04-23 NOTE — Telephone Encounter (Signed)
Patient called requesting rx for metformin 90 day supply. She states her pharmacy told her they called [place] a week ago for this medication. Pt uses [facility].

## 2013-05-07 DIAGNOSIS — J309 Allergic rhinitis, unspecified: Secondary | ICD-10-CM | POA: Diagnosis not present

## 2013-05-08 ENCOUNTER — Other Ambulatory Visit: Payer: Self-pay | Admitting: Family Medicine

## 2013-06-02 DIAGNOSIS — J309 Allergic rhinitis, unspecified: Secondary | ICD-10-CM | POA: Diagnosis not present

## 2013-06-14 DIAGNOSIS — J309 Allergic rhinitis, unspecified: Secondary | ICD-10-CM | POA: Diagnosis not present

## 2013-06-16 DIAGNOSIS — J309 Allergic rhinitis, unspecified: Secondary | ICD-10-CM | POA: Diagnosis not present

## 2013-06-24 ENCOUNTER — Encounter: Payer: Self-pay | Admitting: Family Medicine

## 2013-06-24 ENCOUNTER — Ambulatory Visit (INDEPENDENT_AMBULATORY_CARE_PROVIDER_SITE_OTHER): Payer: Medicare Other | Admitting: Family Medicine

## 2013-06-24 VITALS — BP 132/68 | HR 95 | Temp 98.4°F | Wt 146.0 lb

## 2013-06-24 DIAGNOSIS — E1159 Type 2 diabetes mellitus with other circulatory complications: Secondary | ICD-10-CM | POA: Diagnosis not present

## 2013-06-24 DIAGNOSIS — I10 Essential (primary) hypertension: Secondary | ICD-10-CM

## 2013-06-24 DIAGNOSIS — E039 Hypothyroidism, unspecified: Secondary | ICD-10-CM | POA: Diagnosis not present

## 2013-06-24 DIAGNOSIS — Z23 Encounter for immunization: Secondary | ICD-10-CM

## 2013-06-24 LAB — HEPATIC FUNCTION PANEL
ALT: 30 U/L (ref 0–35)
AST: 20 U/L (ref 0–37)
Albumin: 4.4 g/dL (ref 3.5–5.2)
Alkaline Phosphatase: 40 U/L (ref 39–117)
BILIRUBIN TOTAL: 0.6 mg/dL (ref 0.3–1.2)
Bilirubin, Direct: 0 mg/dL (ref 0.0–0.3)
Total Protein: 7.2 g/dL (ref 6.0–8.3)

## 2013-06-24 LAB — MICROALBUMIN / CREATININE URINE RATIO
CREATININE, U: 29.5 mg/dL
Microalb Creat Ratio: 1.4 mg/g (ref 0.0–30.0)
Microalb, Ur: 0.4 mg/dL (ref 0.0–1.9)

## 2013-06-24 LAB — BASIC METABOLIC PANEL
BUN: 18 mg/dL (ref 6–23)
CO2: 28 mEq/L (ref 19–32)
Calcium: 10 mg/dL (ref 8.4–10.5)
Chloride: 101 mEq/L (ref 96–112)
Creatinine, Ser: 0.8 mg/dL (ref 0.4–1.2)
GFR: 71.3 mL/min (ref >60.00)
Glucose, Bld: 117 mg/dL — ABNORMAL HIGH (ref 70–99)
POTASSIUM: 4.1 meq/L (ref 3.5–5.1)
Sodium: 137 mEq/L (ref 135–145)

## 2013-06-24 LAB — LIPID PANEL
CHOL/HDL RATIO: 3
Cholesterol: 125 mg/dL (ref 0–200)
HDL: 49 mg/dL (ref >39.00)
LDL CALC: 54 mg/dL (ref 0–99)
Triglycerides: 110 mg/dL (ref 0.0–149.0)
VLDL: 22 mg/dL (ref 0.0–40.0)

## 2013-06-24 LAB — HEMOGLOBIN A1C: HEMOGLOBIN A1C: 6.8 % — AB (ref 4.6–6.5)

## 2013-06-24 NOTE — Patient Instructions (Signed)

## 2013-06-24 NOTE — Progress Notes (Signed)
Pre-visit discussion using our clinic review tool. No additional management support is needed unless otherwise documented below in the visit note.  

## 2013-06-24 NOTE — Progress Notes (Signed)
Patient ID: Cheryl Cisneros, female   DOB: 07-23-1945, 68 y.o.   MRN: [id number]   Subjective:    Patient ID: Cheryl Cisneros, female    DOB: 1945-12-09, 68 y.o.   MRN: [id number] HPI  HPI HYPERTENSION  Blood pressure range-not checking  Chest pain- no      Dyspnea- no Lightheadedness- no   Edema- no Other side effects - no   Medication compliance: good Low salt diet- yes  DIABETES  Blood Sugar ranges-120-157  Polyuria- no New Visual problems- no Hypoglycemic symptoms- no Other side effects-no Medication compliance - good Last eye exam- recently per pt-- having cataract surgery Foot exam- today   ROS See HPI above   PMH Smoking Status noted             Objective:    BP 132/68  Pulse 95  Temp(Src) 98.4 F (36.9 C) (Oral)  Wt 146 lb (66.225 kg)  SpO2 97% General appearance: alert, cooperative, appears stated age and no distress Throat: lips, mucosa, and tongue normal; teeth and gums normal Neck: no adenopathy, no carotid bruit, no JVD, supple, symmetrical, trachea midline and thyroid not enlarged, symmetric, no tenderness/mass/nodules Lungs: clear to auscultation bilaterally Heart: regular rate and rhythm, S1, S2 normal, no murmur, click, rub or gallop Extremities: extremities normal, atraumatic, no cyanosis or edema Sensory exam of the foot is normal, tested with the monofilament. Good pulses, no lesions or ulcers, good peripheral pulses.       Assessment & Plan:  1. Type II or unspecified type diabetes mellitus with peripheral circulatory disorders, uncontrolled(250.72) Check labs , con't meds - Basic metabolic panel - Hemoglobin A1c - Hepatic function panel - Lipid panel - Microalbumin / creatinine urine ratio - POCT urinalysis dipstick  2. HTN (hypertension) Stable con't meds  3. Unspecified hypothyroidism Stable con't meds

## 2013-06-25 ENCOUNTER — Telehealth: Payer: Self-pay | Admitting: Family Medicine

## 2013-06-25 ENCOUNTER — Telehealth: Payer: Self-pay

## 2013-06-25 NOTE — Telephone Encounter (Signed)
Relevant patient education assigned to patient using Emmi. ° °

## 2013-06-28 LAB — POCT URINALYSIS DIPSTICK
Bilirubin, UA: NEGATIVE
Glucose, UA: NEGATIVE
Ketones, UA: NEGATIVE
Leukocytes, UA: NEGATIVE
Nitrite, UA: NEGATIVE
PROTEIN UA: NEGATIVE
RBC UA: NEGATIVE
Spec Grav, UA: 1.01
UROBILINOGEN UA: 0.2
pH, UA: 6

## 2013-07-01 ENCOUNTER — Encounter: Payer: Self-pay | Admitting: Family Medicine

## 2013-08-06 DIAGNOSIS — J309 Allergic rhinitis, unspecified: Secondary | ICD-10-CM | POA: Diagnosis not present

## 2013-08-10 DIAGNOSIS — J309 Allergic rhinitis, unspecified: Secondary | ICD-10-CM | POA: Diagnosis not present

## 2013-08-13 DIAGNOSIS — J309 Allergic rhinitis, unspecified: Secondary | ICD-10-CM | POA: Diagnosis not present

## 2013-08-18 DIAGNOSIS — J309 Allergic rhinitis, unspecified: Secondary | ICD-10-CM | POA: Diagnosis not present

## 2013-08-20 DIAGNOSIS — J309 Allergic rhinitis, unspecified: Secondary | ICD-10-CM | POA: Diagnosis not present

## 2013-08-23 DIAGNOSIS — H40029 Open angle with borderline findings, high risk, unspecified eye: Secondary | ICD-10-CM | POA: Diagnosis not present

## 2013-08-23 DIAGNOSIS — J309 Allergic rhinitis, unspecified: Secondary | ICD-10-CM | POA: Diagnosis not present

## 2013-08-23 DIAGNOSIS — E119 Type 2 diabetes mellitus without complications: Secondary | ICD-10-CM | POA: Diagnosis not present

## 2013-08-25 DIAGNOSIS — J309 Allergic rhinitis, unspecified: Secondary | ICD-10-CM | POA: Diagnosis not present

## 2013-08-30 DIAGNOSIS — J309 Allergic rhinitis, unspecified: Secondary | ICD-10-CM | POA: Diagnosis not present

## 2013-11-01 ENCOUNTER — Other Ambulatory Visit: Payer: Self-pay | Admitting: Family Medicine

## 2013-11-01 DIAGNOSIS — H2510 Age-related nuclear cataract, unspecified eye: Secondary | ICD-10-CM | POA: Diagnosis not present

## 2013-11-01 DIAGNOSIS — J309 Allergic rhinitis, unspecified: Secondary | ICD-10-CM | POA: Diagnosis not present

## 2013-11-01 DIAGNOSIS — H353190 Nonexudative age-related macular degeneration, unspecified eye, stage unspecified: Secondary | ICD-10-CM | POA: Diagnosis not present

## 2013-11-02 DIAGNOSIS — J3089 Other allergic rhinitis: Secondary | ICD-10-CM | POA: Diagnosis not present

## 2013-11-02 DIAGNOSIS — J3081 Allergic rhinitis due to animal (cat) (dog) hair and dander: Secondary | ICD-10-CM | POA: Diagnosis not present

## 2013-11-02 DIAGNOSIS — J45909 Unspecified asthma, uncomplicated: Secondary | ICD-10-CM | POA: Diagnosis not present

## 2013-11-02 DIAGNOSIS — J301 Allergic rhinitis due to pollen: Secondary | ICD-10-CM | POA: Diagnosis not present

## 2013-11-03 DIAGNOSIS — J309 Allergic rhinitis, unspecified: Secondary | ICD-10-CM | POA: Diagnosis not present

## 2013-11-30 DIAGNOSIS — J309 Allergic rhinitis, unspecified: Secondary | ICD-10-CM | POA: Diagnosis not present

## 2013-12-28 ENCOUNTER — Encounter: Payer: Self-pay | Admitting: Family Medicine

## 2013-12-28 ENCOUNTER — Ambulatory Visit (INDEPENDENT_AMBULATORY_CARE_PROVIDER_SITE_OTHER): Payer: Medicare Other | Admitting: Family Medicine

## 2013-12-28 VITALS — BP 140/72 | HR 94 | Temp 98.9°F | Ht 61.5 in | Wt 139.6 lb

## 2013-12-28 DIAGNOSIS — E1159 Type 2 diabetes mellitus with other circulatory complications: Secondary | ICD-10-CM | POA: Diagnosis not present

## 2013-12-28 DIAGNOSIS — I10 Essential (primary) hypertension: Secondary | ICD-10-CM | POA: Diagnosis not present

## 2013-12-28 DIAGNOSIS — E785 Hyperlipidemia, unspecified: Secondary | ICD-10-CM

## 2013-12-28 DIAGNOSIS — Z0000 Encounter for general adult medical examination without abnormal findings: Secondary | ICD-10-CM | POA: Diagnosis not present

## 2013-12-28 DIAGNOSIS — Z23 Encounter for immunization: Secondary | ICD-10-CM

## 2013-12-28 DIAGNOSIS — E039 Hypothyroidism, unspecified: Secondary | ICD-10-CM

## 2013-12-28 DIAGNOSIS — J309 Allergic rhinitis, unspecified: Secondary | ICD-10-CM | POA: Diagnosis not present

## 2013-12-28 NOTE — Patient Instructions (Signed)
Preventive Care for Adults A healthy lifestyle and preventive care can promote health and wellness. Preventive health guidelines for women include the following key practices.  A routine yearly physical is a good way to check with your health care provider about your health and preventive screening. It is a chance to share any concerns and updates on your health and to receive a thorough exam.  Visit your dentist for a routine exam and preventive care every 6 months. Brush your teeth twice a day and floss once a day. Good oral hygiene prevents tooth decay and gum disease.  The frequency of eye exams is based on your age, health, family medical history, use of contact lenses, and other factors. Follow your health care provider's recommendations for frequency of eye exams.  Eat a healthy diet. Foods like vegetables, fruits, whole grains, low-fat dairy products, and lean protein foods contain the nutrients you need without too many calories. Decrease your intake of foods high in solid fats, added sugars, and salt. Eat the right amount of calories for you.Get information about a proper diet from your health care provider, if necessary.  Regular physical exercise is one of the most important things you can do for your health. Most adults should get at least 150 minutes of moderate-intensity exercise (any activity that increases your heart rate and causes you to sweat) each week. In addition, most adults need muscle-strengthening exercises on 2 or more days a week.  Maintain a healthy weight. The body mass index (BMI) is a screening tool to identify possible weight problems. It provides an estimate of body fat based on height and weight. Your health care provider can find your BMI and can help you achieve or maintain a healthy weight.For adults 20 years and older:  A BMI below 18.5 is considered underweight.  A BMI of 18.5 to 24.9 is normal.  A BMI of 25 to 29.9 is considered overweight.  A BMI of  30 and above is considered obese.  Maintain normal blood lipids and cholesterol levels by exercising and minimizing your intake of saturated fat. Eat a balanced diet with plenty of fruit and vegetables. Blood tests for lipids and cholesterol should begin at age 76 and be repeated every 5 years. If your lipid or cholesterol levels are high, you are over 50, or you are at high risk for heart disease, you may need your cholesterol levels checked more frequently.Ongoing high lipid and cholesterol levels should be treated with medicines if diet and exercise are not working.  If you smoke, find out from your health care provider how to quit. If you do not use tobacco, do not start.  Lung cancer screening is recommended for adults aged 22-80 years who are at high risk for developing lung cancer because of a history of smoking. A yearly low-dose CT scan of the lungs is recommended for people who have at least a 30-pack-year history of smoking and are a current smoker or have quit within the past 15 years. A pack year of smoking is smoking an average of 1 pack of cigarettes a day for 1 year (for example: 1 pack a day for 30 years or 2 packs a day for 15 years). Yearly screening should continue until the smoker has stopped smoking for at least 15 years. Yearly screening should be stopped for people who develop a health problem that would prevent them from having lung cancer treatment.  If you are pregnant, do not drink alcohol. If you are breastfeeding,  be very cautious about drinking alcohol. If you are not pregnant and choose to drink alcohol, do not have more than 1 drink per day. One drink is considered to be 12 ounces (355 mL) of beer, 5 ounces (148 mL) of wine, or 1.5 ounces (44 mL) of liquor.  Avoid use of street drugs. Do not share needles with anyone. Ask for help if you need support or instructions about stopping the use of drugs.  High blood pressure causes heart disease and increases the risk of  stroke. Your blood pressure should be checked at least every 1 to 2 years. Ongoing high blood pressure should be treated with medicines if weight loss and exercise do not work.  If you are 75-52 years old, ask your health care provider if you should take aspirin to prevent strokes.  Diabetes screening involves taking a blood sample to check your fasting blood sugar level. This should be done once every 3 years, after age 15, if you are within normal weight and without risk factors for diabetes. Testing should be considered at a younger age or be carried out more frequently if you are overweight and have at least 1 risk factor for diabetes.  Breast cancer screening is essential preventive care for women. You should practice "breast self-awareness." This means understanding the normal appearance and feel of your breasts and may include breast self-examination. Any changes detected, no matter how small, should be reported to a health care provider. Women in their 58s and 30s should have a clinical breast exam (CBE) by a health care provider as part of a regular health exam every 1 to 3 years. After age 16, women should have a CBE every year. Starting at age 53, women should consider having a mammogram (breast X-ray test) every year. Women who have a family history of breast cancer should talk to their health care provider about genetic screening. Women at a high risk of breast cancer should talk to their health care providers about having an MRI and a mammogram every year.  Breast cancer gene (BRCA)-related cancer risk assessment is recommended for women who have family members with BRCA-related cancers. BRCA-related cancers include breast, ovarian, tubal, and peritoneal cancers. Having family members with these cancers may be associated with an increased risk for harmful changes (mutations) in the breast cancer genes BRCA1 and BRCA2. Results of the assessment will determine the need for genetic counseling and  BRCA1 and BRCA2 testing.  Routine pelvic exams to screen for cancer are no longer recommended for nonpregnant women who are considered low risk for cancer of the pelvic organs (ovaries, uterus, and vagina) and who do not have symptoms. Ask your health care provider if a screening pelvic exam is right for you.  If you have had past treatment for cervical cancer or a condition that could lead to cancer, you need Pap tests and screening for cancer for at least 20 years after your treatment. If Pap tests have been discontinued, your risk factors (such as having a new sexual partner) need to be reassessed to determine if screening should be resumed. Some women have medical problems that increase the chance of getting cervical cancer. In these cases, your health care provider may recommend more frequent screening and Pap tests.  The HPV test is an additional test that may be used for cervical cancer screening. The HPV test looks for the virus that can cause the cell changes on the cervix. The cells collected during the Pap test can be  tested for HPV. The HPV test could be used to screen women aged 30 years and older, and should be used in women of any age who have unclear Pap test results. After the age of 30, women should have HPV testing at the same frequency as a Pap test.  Colorectal cancer can be detected and often prevented. Most routine colorectal cancer screening begins at the age of 50 years and continues through age 75 years. However, your health care provider may recommend screening at an earlier age if you have risk factors for colon cancer. On a yearly basis, your health care provider may provide home test kits to check for hidden blood in the stool. Use of a small camera at the end of a tube, to directly examine the colon (sigmoidoscopy or colonoscopy), can detect the earliest forms of colorectal cancer. Talk to your health care provider about this at age 50, when routine screening begins. Direct  exam of the colon should be repeated every 5-10 years through age 75 years, unless early forms of pre-cancerous polyps or small growths are found.  People who are at an increased risk for hepatitis B should be screened for this virus. You are considered at high risk for hepatitis B if:  You were born in a country where hepatitis B occurs often. Talk with your health care provider about which countries are considered high risk.  Your parents were born in a high-risk country and you have not received a shot to protect against hepatitis B (hepatitis B vaccine).  You have HIV or AIDS.  You use needles to inject street drugs.  You live with, or have sex with, someone who has hepatitis B.  You get hemodialysis treatment.  You take certain medicines for conditions like cancer, organ transplantation, and autoimmune conditions.  Hepatitis C blood testing is recommended for all people born from 1945 through 1965 and any individual with known risks for hepatitis C.  Practice safe sex. Use condoms and avoid high-risk sexual practices to reduce the spread of sexually transmitted infections (STIs). STIs include gonorrhea, chlamydia, syphilis, trichomonas, herpes, HPV, and human immunodeficiency virus (HIV). Herpes, HIV, and HPV are viral illnesses that have no cure. They can result in disability, cancer, and death.  You should be screened for sexually transmitted illnesses (STIs) including gonorrhea and chlamydia if:  You are sexually active and are younger than 24 years.  You are older than 24 years and your health care provider tells you that you are at risk for this type of infection.  Your sexual activity has changed since you were last screened and you are at an increased risk for chlamydia or gonorrhea. Ask your health care provider if you are at risk.  If you are at risk of being infected with HIV, it is recommended that you take a prescription medicine daily to prevent HIV infection. This is  called preexposure prophylaxis (PrEP). You are considered at risk if:  You are a heterosexual woman, are sexually active, and are at increased risk for HIV infection.  You take drugs by injection.  You are sexually active with a partner who has HIV.  Talk with your health care provider about whether you are at high risk of being infected with HIV. If you choose to begin PrEP, you should first be tested for HIV. You should then be tested every 3 months for as long as you are taking PrEP.  Osteoporosis is a disease in which the bones lose minerals and strength   with aging. This can result in serious bone fractures or breaks. The risk of osteoporosis can be identified using a bone density scan. Women ages 65 years and over and women at risk for fractures or osteoporosis should discuss screening with their health care providers. Ask your health care provider whether you should take a calcium supplement or vitamin D to reduce the rate of osteoporosis.  Menopause can be associated with physical symptoms and risks. Hormone replacement therapy is available to decrease symptoms and risks. You should talk to your health care provider about whether hormone replacement therapy is right for you.  Use sunscreen. Apply sunscreen liberally and repeatedly throughout the day. You should seek shade when your shadow is shorter than you. Protect yourself by wearing long sleeves, pants, a wide-brimmed hat, and sunglasses year round, whenever you are outdoors.  Once a month, do a whole body skin exam, using a mirror to look at the skin on your back. Tell your health care provider of new moles, moles that have irregular borders, moles that are larger than a pencil eraser, or moles that have changed in shape or color.  Stay current with required vaccines (immunizations).  Influenza vaccine. All adults should be immunized every year.  Tetanus, diphtheria, and acellular pertussis (Td, Tdap) vaccine. Pregnant women should  receive 1 dose of Tdap vaccine during each pregnancy. The dose should be obtained regardless of the length of time since the last dose. Immunization is preferred during the 27th-36th week of gestation. An adult who has not previously received Tdap or who does not know her vaccine status should receive 1 dose of Tdap. This initial dose should be followed by tetanus and diphtheria toxoids (Td) booster doses every 10 years. Adults with an unknown or incomplete history of completing a 3-dose immunization series with Td-containing vaccines should begin or complete a primary immunization series including a Tdap dose. Adults should receive a Td booster every 10 years.  Varicella vaccine. An adult without evidence of immunity to varicella should receive 2 doses or a second dose if she has previously received 1 dose. Pregnant females who do not have evidence of immunity should receive the first dose after pregnancy. This first dose should be obtained before leaving the health care facility. The second dose should be obtained 4-8 weeks after the first dose.  Human papillomavirus (HPV) vaccine. Females aged 13-26 years who have not received the vaccine previously should obtain the 3-dose series. The vaccine is not recommended for use in pregnant females. However, pregnancy testing is not needed before receiving a dose. If a female is found to be pregnant after receiving a dose, no treatment is needed. In that case, the remaining doses should be delayed until after the pregnancy. Immunization is recommended for any person with an immunocompromised condition through the age of 26 years if she did not get any or all doses earlier. During the 3-dose series, the second dose should be obtained 4-8 weeks after the first dose. The third dose should be obtained 24 weeks after the first dose and 16 weeks after the second dose.  Zoster vaccine. One dose is recommended for adults aged 60 years or older unless certain conditions are  present.  Measles, mumps, and rubella (MMR) vaccine. Adults born before 1957 generally are considered immune to measles and mumps. Adults born in 1957 or later should have 1 or more doses of MMR vaccine unless there is a contraindication to the vaccine or there is laboratory evidence of immunity to   each of the three diseases. A routine second dose of MMR vaccine should be obtained at least 28 days after the first dose for students attending postsecondary schools, health care workers, or international travelers. People who received inactivated measles vaccine or an unknown type of measles vaccine during 1963-1967 should receive 2 doses of MMR vaccine. People who received inactivated mumps vaccine or an unknown type of mumps vaccine before 1979 and are at high risk for mumps infection should consider immunization with 2 doses of MMR vaccine. For females of childbearing age, rubella immunity should be determined. If there is no evidence of immunity, females who are not pregnant should be vaccinated. If there is no evidence of immunity, females who are pregnant should delay immunization until after pregnancy. Unvaccinated health care workers born before 1957 who lack laboratory evidence of measles, mumps, or rubella immunity or laboratory confirmation of disease should consider measles and mumps immunization with 2 doses of MMR vaccine or rubella immunization with 1 dose of MMR vaccine.  Pneumococcal 13-valent conjugate (PCV13) vaccine. When indicated, a person who is uncertain of her immunization history and has no record of immunization should receive the PCV13 vaccine. An adult aged 19 years or older who has certain medical conditions and has not been previously immunized should receive 1 dose of PCV13 vaccine. This PCV13 should be followed with a dose of pneumococcal polysaccharide (PPSV23) vaccine. The PPSV23 vaccine dose should be obtained at least 8 weeks after the dose of PCV13 vaccine. An adult aged 19  years or older who has certain medical conditions and previously received 1 or more doses of PPSV23 vaccine should receive 1 dose of PCV13. The PCV13 vaccine dose should be obtained 1 or more years after the last PPSV23 vaccine dose.  Pneumococcal polysaccharide (PPSV23) vaccine. When PCV13 is also indicated, PCV13 should be obtained first. All adults aged 65 years and older should be immunized. An adult younger than age 65 years who has certain medical conditions should be immunized. Any person who resides in a nursing home or long-term care facility should be immunized. An adult smoker should be immunized. People with an immunocompromised condition and certain other conditions should receive both PCV13 and PPSV23 vaccines. People with human immunodeficiency virus (HIV) infection should be immunized as soon as possible after diagnosis. Immunization during chemotherapy or radiation therapy should be avoided. Routine use of PPSV23 vaccine is not recommended for American Indians, [place] Natives, or people younger than 65 years unless there are medical conditions that require PPSV23 vaccine. When indicated, people who have unknown immunization and have no record of immunization should receive PPSV23 vaccine. One-time revaccination 5 years after the first dose of PPSV23 is recommended for people aged 19-64 years who have chronic kidney failure, nephrotic syndrome, asplenia, or immunocompromised conditions. People who received 1-2 doses of PPSV23 before age 65 years should receive another dose of PPSV23 vaccine at age 65 years or later if at least 5 years have passed since the previous dose. Doses of PPSV23 are not needed for people immunized with PPSV23 at or after age 65 years.  Meningococcal vaccine. Adults with asplenia or persistent complement component deficiencies should receive 2 doses of quadrivalent meningococcal conjugate (MenACWY-D) vaccine. The doses should be obtained at least 2 months apart.  Microbiologists working with certain meningococcal bacteria, military recruits, people at risk during an outbreak, and people who travel to or live in countries with a high rate of meningitis should be immunized. A first-year college student up through age   21 years who is living in a residence hall should receive a dose if she did not receive a dose on or after her 16th birthday. Adults who have certain high-risk conditions should receive one or more doses of vaccine.  Hepatitis A vaccine. Adults who wish to be protected from this disease, have certain high-risk conditions, work with hepatitis A-infected animals, work in hepatitis A research labs, or travel to or work in countries with a high rate of hepatitis A should be immunized. Adults who were previously unvaccinated and who anticipate close contact with an international adoptee during the first 60 days after arrival in the [place] from a country with a high rate of hepatitis A should be immunized.  Hepatitis B vaccine. Adults who wish to be protected from this disease, have certain high-risk conditions, may be exposed to blood or other infectious body fluids, are household contacts or sex partners of hepatitis B positive people, are clients or workers in certain care facilities, or travel to or work in countries with a high rate of hepatitis B should be immunized.  Haemophilus influenzae type b (Hib) vaccine. A previously unvaccinated person with asplenia or sickle cell disease or having a scheduled splenectomy should receive 1 dose of Hib vaccine. Regardless of previous immunization, a recipient of a hematopoietic stem cell transplant should receive a 3-dose series 6-12 months after her successful transplant. Hib vaccine is not recommended for adults with HIV infection. Preventive Services / Frequency Ages 64 to 68 years  Blood pressure check.** / Every 1 to 2 years.  Lipid and cholesterol check.** / Every 5 years beginning at age  22.  Clinical breast exam.** / Every 3 years for women in their 88s and 53s.  BRCA-related cancer risk assessment.** / For women who have family members with a BRCA-related cancer (breast, ovarian, tubal, or peritoneal cancers).  Pap test.** / Every 2 years from ages 90 through 51. Every 3 years starting at age 21 through age 56 or 3 with a history of 3 consecutive normal Pap tests.  HPV screening.** / Every 3 years from ages 24 through ages 1 to 46 with a history of 3 consecutive normal Pap tests.  Hepatitis C blood test.** / For any individual with known risks for hepatitis C.  Skin self-exam. / Monthly.  Influenza vaccine. / Every year.  Tetanus, diphtheria, and acellular pertussis (Tdap, Td) vaccine.** / Consult your health care provider. Pregnant women should receive 1 dose of Tdap vaccine during each pregnancy. 1 dose of Td every 10 years.  Varicella vaccine.** / Consult your health care provider. Pregnant females who do not have evidence of immunity should receive the first dose after pregnancy.  HPV vaccine. / 3 doses over 6 months, if 72 and younger. The vaccine is not recommended for use in pregnant females. However, pregnancy testing is not needed before receiving a dose.  Measles, mumps, rubella (MMR) vaccine.** / You need at least 1 dose of MMR if you were born in 1957 or later. You may also need a 2nd dose. For females of childbearing age, rubella immunity should be determined. If there is no evidence of immunity, females who are not pregnant should be vaccinated. If there is no evidence of immunity, females who are pregnant should delay immunization until after pregnancy.  Pneumococcal 13-valent conjugate (PCV13) vaccine.** / Consult your health care provider.  Pneumococcal polysaccharide (PPSV23) vaccine.** / 1 to 2 doses if you smoke cigarettes or if you have certain conditions.  Meningococcal vaccine.** /  1 dose if you are age 19 to 21 years and a first-year college  student living in a residence hall, or have one of several medical conditions, you need to get vaccinated against meningococcal disease. You may also need additional booster doses.  Hepatitis A vaccine.** / Consult your health care provider.  Hepatitis B vaccine.** / Consult your health care provider.  Haemophilus influenzae type b (Hib) vaccine.** / Consult your health care provider. Ages 40 to 64 years  Blood pressure check.** / Every 1 to 2 years.  Lipid and cholesterol check.** / Every 5 years beginning at age 20 years.  Lung cancer screening. / Every year if you are aged 55-80 years and have a 30-pack-year history of smoking and currently smoke or have quit within the past 15 years. Yearly screening is stopped once you have quit smoking for at least 15 years or develop a health problem that would prevent you from having lung cancer treatment.  Clinical breast exam.** / Every year after age 40 years.  BRCA-related cancer risk assessment.** / For women who have family members with a BRCA-related cancer (breast, ovarian, tubal, or peritoneal cancers).  Mammogram.** / Every year beginning at age 40 years and continuing for as long as you are in good health. Consult with your health care provider.  Pap test.** / Every 3 years starting at age 30 years through age 65 or 70 years with a history of 3 consecutive normal Pap tests.  HPV screening.** / Every 3 years from ages 30 years through ages 65 to 70 years with a history of 3 consecutive normal Pap tests.  Fecal occult blood test (FOBT) of stool. / Every year beginning at age 50 years and continuing until age 75 years. You may not need to do this test if you get a colonoscopy every 10 years.  Flexible sigmoidoscopy or colonoscopy.** / Every 5 years for a flexible sigmoidoscopy or every 10 years for a colonoscopy beginning at age 50 years and continuing until age 75 years.  Hepatitis C blood test.** / For all people born from 1945 through  1965 and any individual with known risks for hepatitis C.  Skin self-exam. / Monthly.  Influenza vaccine. / Every year.  Tetanus, diphtheria, and acellular pertussis (Tdap/Td) vaccine.** / Consult your health care provider. Pregnant women should receive 1 dose of Tdap vaccine during each pregnancy. 1 dose of Td every 10 years.  Varicella vaccine.** / Consult your health care provider. Pregnant females who do not have evidence of immunity should receive the first dose after pregnancy.  Zoster vaccine.** / 1 dose for adults aged 60 years or older.  Measles, mumps, rubella (MMR) vaccine.** / You need at least 1 dose of MMR if you were born in 1957 or later. You may also need a 2nd dose. For females of childbearing age, rubella immunity should be determined. If there is no evidence of immunity, females who are not pregnant should be vaccinated. If there is no evidence of immunity, females who are pregnant should delay immunization until after pregnancy.  Pneumococcal 13-valent conjugate (PCV13) vaccine.** / Consult your health care provider.  Pneumococcal polysaccharide (PPSV23) vaccine.** / 1 to 2 doses if you smoke cigarettes or if you have certain conditions.  Meningococcal vaccine.** / Consult your health care provider.  Hepatitis A vaccine.** / Consult your health care provider.  Hepatitis B vaccine.** / Consult your health care provider.  Haemophilus influenzae type b (Hib) vaccine.** / Consult your health care provider. Ages 65   years and over  Blood pressure check.** / Every 1 to 2 years.  Lipid and cholesterol check.** / Every 5 years beginning at age 22 years.  Lung cancer screening. / Every year if you are aged 73-80 years and have a 30-pack-year history of smoking and currently smoke or have quit within the past 15 years. Yearly screening is stopped once you have quit smoking for at least 15 years or develop a health problem that would prevent you from having lung cancer  treatment.  Clinical breast exam.** / Every year after age 4 years.  BRCA-related cancer risk assessment.** / For women who have family members with a BRCA-related cancer (breast, ovarian, tubal, or peritoneal cancers).  Mammogram.** / Every year beginning at age 40 years and continuing for as long as you are in good health. Consult with your health care provider.  Pap test.** / Every 3 years starting at age 9 years through age 34 or 91 years with 3 consecutive normal Pap tests. Testing can be stopped between 65 and 70 years with 3 consecutive normal Pap tests and no abnormal Pap or HPV tests in the past 10 years.  HPV screening.** / Every 3 years from ages 57 years through ages 64 or 45 years with a history of 3 consecutive normal Pap tests. Testing can be stopped between 65 and 70 years with 3 consecutive normal Pap tests and no abnormal Pap or HPV tests in the past 10 years.  Fecal occult blood test (FOBT) of stool. / Every year beginning at age 15 years and continuing until age 17 years. You may not need to do this test if you get a colonoscopy every 10 years.  Flexible sigmoidoscopy or colonoscopy.** / Every 5 years for a flexible sigmoidoscopy or every 10 years for a colonoscopy beginning at age 86 years and continuing until age 71 years.  Hepatitis C blood test.** / For all people born from [birth year] through 1967 and any individual with known risks for hepatitis C.  Osteoporosis screening.** / A one-time screening for women ages 83 years and over and women at risk for fractures or osteoporosis.  Skin self-exam. / Monthly.  Influenza vaccine. / Every year.  Tetanus, diphtheria, and acellular pertussis (Tdap/Td) vaccine.** / 1 dose of Td every 10 years.  Varicella vaccine.** / Consult your health care provider.  Zoster vaccine.** / 1 dose for adults aged 61 years or older.  Pneumococcal 13-valent conjugate (PCV13) vaccine.** / Consult your health care provider.  Pneumococcal  polysaccharide (PPSV23) vaccine.** / 1 dose for all adults aged 28 years and older.  Meningococcal vaccine.** / Consult your health care provider.  Hepatitis A vaccine.** / Consult your health care provider.  Hepatitis B vaccine.** / Consult your health care provider.  Haemophilus influenzae type b (Hib) vaccine.** / Consult your health care provider. ** Family history and personal history of risk and conditions may change your health care provider's recommendations. Document Released: 06/04/2001 Document Revised: 08/23/2013 Document Reviewed: 09/03/2010 [facility] Patient Information 2015 [place], [place]. This information is not intended to replace advice given to you by your health care provider. Make sure you discuss any questions you have with your health care provider.

## 2013-12-28 NOTE — Progress Notes (Signed)
Pre visit review using our clinic review tool, if applicable. No additional management support is needed unless otherwise documented below in the visit note. 

## 2013-12-28 NOTE — Progress Notes (Signed)
Subjective:    Cheryl Cisneros is a 68 y.o. female who presents for Medicare Annual/Subsequent preventive examination.  Preventive Screening-Counseling & Management  Tobacco History  Smoking status  . Never Smoker   Smokeless tobacco  . Never Used     Problems Prior to Visit 1. none  Current Problems (verified) Patient Active Problem List   Diagnosis Date Noted  . Contact dermatitis due to plants, except food 08/06/2010  . MOLE 12/14/2009  . 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 OSTEOPENIA 03/27/2009  . DIABETES MELLITUS, TYPE II 01/13/2009  . UTI 01/09/2009  . ARTIFICIAL MENOPAUSE 12/13/2008  . INFLUENZA WITH OTHER RESPIRATORY MANIFESTATIONS 03/07/2008  . UNSPECIFIED GLAUCOMA 12/29/2007  . OTHER CATARACT 12/29/2007  . MITRAL REGURGITATION, MODERATE 12/29/2007  . HYPOTHYROIDISM 10/27/2006  . ALLERGIC RHINITIS 10/27/2006  . ASTHMA 10/27/2006  . GERD 10/27/2006  . HEADACHE 10/27/2006  . NEPHROLITHIASIS, HX OF 10/27/2006  . Other Postprocedural Status 10/27/2006  . HYPERTENSION 07/29/2006    Medications Prior to Visit Current Outpatient Prescriptions on File Prior to Visit  Medication Sig Dispense Refill  . AMBULATORY NON FORMULARY MEDICATION Diabetic Shoes to use daily. Dx 250.00  2 each  0  . amLODipine (NORVASC) 5 MG tablet TAKE ONE TABLET BY MOUTH ONCE DAILY  90 tablet  0  . Biotin 1000 MCG tablet Take 1,000 mcg by mouth daily.       . Calcium-Vitamin D 600-200 MG-UNIT per tablet Take 1 tablet by mouth 3 (three) times daily with meals.        . cetirizine (ZYRTEC) 10 MG tablet TAKE ONE TABLET BY MOUTH EVERY DAY  90 tablet  3  . Cholecalciferol (VITAMIN D3) 1000 UNITS CAPS Take 1 capsule by mouth daily.        Marland Kitchen EPIPEN 2-PAK 0.3 MG/0.3ML DEVI       . Garlic Oil (SUPER GARLIC) 123XX123 MG CAPS Take 1 capsule by mouth daily.       Marland Kitchen L-Lysine 500 MG TABS Take 1 tablet by mouth daily.        . Lancets (FREESTYLE) lancets Test bid Dx. 250.00  200 each  12  . levothyroxine  (SYNTHROID, LEVOTHROID) 112 MCG tablet TAKE ONE TABLET BY MOUTH EVERY DAY  90 tablet  3  . Magnesium 250 MG TABS Take 1 tablet by mouth 2 (two) times daily.        . metFORMIN (GLUCOPHAGE) 1000 MG tablet Take one tablet by mouth twice daily with meals--labs are due now  180 tablet  0  . Omega-3 Fatty Acids (OMEGA 3 PO) Take 1 tablet by mouth daily.        Marland Kitchen omeprazole (PRILOSEC) 40 MG capsule TAKE ONE CAPSULE BY MOUTH EVERY DAY  90 capsule  3  . ONE TOUCH ULTRA TEST test strip CHECK BLOOD SUGAR TWICE DAILY  100 each  11  . pravastatin (PRAVACHOL) 10 MG tablet TAKE ONE TABLET BY MOUTH ONCE DAILY  90 tablet  0  . SIMPLY SALINE NA Place 1 spray into the nose daily.         No current facility-administered medications on file prior to visit.    Current Medications (verified) Current Outpatient Prescriptions  Medication Sig Dispense Refill  . AMBULATORY NON FORMULARY MEDICATION Diabetic Shoes to use daily. Dx 250.00  2 each  0  . amLODipine (NORVASC) 5 MG tablet TAKE ONE TABLET BY MOUTH ONCE DAILY  90 tablet  0  . Biotin 1000 MCG tablet Take 1,000 mcg by mouth  daily.       . Calcium-Vitamin D 600-200 MG-UNIT per tablet Take 1 tablet by mouth 3 (three) times daily with meals.        . cetirizine (ZYRTEC) 10 MG tablet TAKE ONE TABLET BY MOUTH EVERY DAY  90 tablet  3  . Cholecalciferol (VITAMIN D3) 1000 UNITS CAPS Take 1 capsule by mouth daily.        Marland Kitchen EPIPEN 2-PAK 0.3 MG/0.3ML DEVI       . Garlic Oil (SUPER GARLIC) 123XX123 MG CAPS Take 1 capsule by mouth daily.       Marland Kitchen L-Lysine 500 MG TABS Take 1 tablet by mouth daily.        . Lancets (FREESTYLE) lancets Test bid Dx. 250.00  200 each  12  . levothyroxine (SYNTHROID, LEVOTHROID) 112 MCG tablet TAKE ONE TABLET BY MOUTH EVERY DAY  90 tablet  3  . Magnesium 250 MG TABS Take 1 tablet by mouth 2 (two) times daily.        . metFORMIN (GLUCOPHAGE) 1000 MG tablet Take one tablet by mouth twice daily with meals--labs are due now  180 tablet  0  . Omega-3  Fatty Acids (OMEGA 3 PO) Take 1 tablet by mouth daily.        Marland Kitchen omeprazole (PRILOSEC) 40 MG capsule TAKE ONE CAPSULE BY MOUTH EVERY DAY  90 capsule  3  . ONE TOUCH ULTRA TEST test strip CHECK BLOOD SUGAR TWICE DAILY  100 each  11  . OVER THE COUNTER MEDICATION Maxi-vision 2 drops per day      . pravastatin (PRAVACHOL) 10 MG tablet TAKE ONE TABLET BY MOUTH ONCE DAILY  90 tablet  0  . SIMPLY SALINE NA Place 1 spray into the nose daily.        . Turmeric 500 MG CAPS Take 1 capsule by mouth 2 (two) times daily.      . vitamin C (ASCORBIC ACID) 500 MG tablet Take 1,000 mg by mouth daily.       No current facility-administered medications for this visit.     Allergies (verified) Review of patient's allergies indicates no known allergies.   PAST HISTORY  Family History Family History  Problem Relation Age of Onset  . Coronary artery disease    . Lung cancer    . Diabetes Mother   . Hypertension Mother   . Kidney disease Mother     dialysis  . Diabetes Father   . Hypertension Father   . Cancer Father     lung  . Dementia Sister     Social History History  Substance Use Topics  . Smoking status: Never Smoker   . Smokeless tobacco: Never Used  . Alcohol Use: No     Are there smokers in your home (other than you)? No  Risk Factors Current exercise habits: MWF-- bootcamp  Dietary issues discussed: na   Cardiac risk factors: advanced age (older than 74 for men, 50 for women), diabetes mellitus, dyslipidemia and hypertension.  Depression Screen (Note: if answer to either of the following is "Yes", a more complete depression screening is indicated)   Over the past two weeks, have you felt down, depressed or hopeless? No  Over the past two weeks, have you felt little interest or pleasure in doing things? No  Have you lost interest or pleasure in daily life? No  Do you often feel hopeless? No  Do you cry easily over simple problems? No  Activities of Daily  Living In your  present state of health, do you have any difficulty performing the following activities?:  Driving? No Managing money?  No Feeding yourself? Yes Getting from bed to chair? No Climbing a flight of stairs? No Preparing food and eating?: No Bathing or showering? No Getting dressed: No Getting to the toilet? No Using the toilet:No Moving around from place to place: No In the past year have you fallen or had a near fall?:No   Are you sexually active?  Yes  Do you have more than one partner?  No  Hearing Difficulties: No Do you often ask people to speak up or repeat themselves? No Do you experience ringing or noises in your ears? No Do you have difficulty understanding soft or whispered voices? No   Do you feel that you have a problem with memory? No  Do you often misplace items? No  Do you feel safe at home?  Yes  Cognitive Testing  Alert? Yes  Normal Appearance?Yes  Oriented to person? Yes  Place? Yes   Time? Yes  Recall of three objects?  Yes  Can perform simple calculations? Yes  Displays appropriate judgment?Yes  Can read the correct time from a watch face?Yes   Advanced Directives have been discussed with the patient? Yes  List the Names of Other Physician/Practitioners you currently use: 1.  opth-- [facility]man 2.  Dentist-- odi  3.  Allergist-- Harold Hedge  Indicate any recent Medical Services you may have received from other than [facility] providers in the past year (date may be approximate).  Immunization History  Administered Date(s) Administered  . Influenza Split 02/12/2011  . Influenza Whole 02/01/2009  . Influenza,inj,Quad PF,36+ Mos 12/28/2013  . Pneumococcal Conjugate-13 06/24/2013  . Pneumococcal Polysaccharide-23 12/29/2007, 12/13/2008  . Td 09/16/2001  . Tdap 06/28/2013  . Zoster 11/12/2007    Screening Tests Health Maintenance  Topic Date Due  . Ophthalmology Exam  09/30/2013  . Foot Exam  12/25/2013  . Hemoglobin A1c  12/25/2013  . Urine Microalbumin   06/25/2014  . Influenza Vaccine  11/21/2014  . Mammogram  02/13/2015  . Colonoscopy  04/04/2015  . Tetanus/tdap  06/29/2023  . Pneumococcal Polysaccharide Vaccine Age 31 And Over  Completed  . Zostavax  Completed    All answers were reviewed with the patient and necessary referrals were made:  Garnet Koyanagi, DO   12/28/2013   History reviewed:  She  has a past medical history of Hypertension; Allergy; GERD (gastroesophageal reflux disease); Thyroid disease; Asthma; Macular degeneration (12/25/2011); and Cataract. She  does not have any pertinent problems on file. She  has past surgical history that includes Tonsillectomy; Abdominal hysterectomy (1986); Knee arthroscopy; Lumbar laminectomy (1999); and Rotator cuff repair (02/27/2005). Her family history includes Cancer in her father; Coronary artery disease in an other family member; Dementia in her sister; Diabetes in her brother, father, mother, sister, and sister; Hyperlipidemia in her brother, sister, and sister; Hypertension in her brother, father, mother, sister, and sister; Kidney disease in her mother; Lung cancer in an other family member. She  reports that she has never smoked. She has never used smokeless tobacco. She reports that she does not drink alcohol or use illicit drugs. She has a current medication list which includes the following prescription(s): AMBULATORY NON FORMULARY MEDICATION, amlodipine, biotin, calcium-vitamin d, cetirizine, vitamin d3, epipen 2-pak, garlic oil, l-lysine, freestyle, levothyroxine, magnesium, metformin, omega-3 fatty acids, omeprazole, one touch ultra test, OVER THE COUNTER MEDICATION, pravastatin, saline, turmeric, and vitamin c. Current  Outpatient Prescriptions on File Prior to Visit  Medication Sig Dispense Refill  . AMBULATORY NON FORMULARY MEDICATION Diabetic Shoes to use daily. Dx 250.00  2 each  0  . amLODipine (NORVASC) 5 MG tablet TAKE ONE TABLET BY MOUTH ONCE DAILY  90 tablet  0  . Biotin 1000  MCG tablet Take 1,000 mcg by mouth daily.       . Calcium-Vitamin D 600-200 MG-UNIT per tablet Take 1 tablet by mouth 3 (three) times daily with meals.        . cetirizine (ZYRTEC) 10 MG tablet TAKE ONE TABLET BY MOUTH EVERY DAY  90 tablet  3  . Cholecalciferol (VITAMIN D3) 1000 UNITS CAPS Take 1 capsule by mouth daily.        Marland Kitchen EPIPEN 2-PAK 0.3 MG/0.3ML DEVI       . Garlic Oil (SUPER GARLIC) 123XX123 MG CAPS Take 1 capsule by mouth daily.       Marland Kitchen L-Lysine 500 MG TABS Take 1 tablet by mouth daily.        . Lancets (FREESTYLE) lancets Test bid Dx. 250.00  200 each  12  . levothyroxine (SYNTHROID, LEVOTHROID) 112 MCG tablet TAKE ONE TABLET BY MOUTH EVERY DAY  90 tablet  3  . Magnesium 250 MG TABS Take 1 tablet by mouth 2 (two) times daily.        . metFORMIN (GLUCOPHAGE) 1000 MG tablet Take one tablet by mouth twice daily with meals--labs are due now  180 tablet  0  . Omega-3 Fatty Acids (OMEGA 3 PO) Take 1 tablet by mouth daily.        Marland Kitchen omeprazole (PRILOSEC) 40 MG capsule TAKE ONE CAPSULE BY MOUTH EVERY DAY  90 capsule  3  . ONE TOUCH ULTRA TEST test strip CHECK BLOOD SUGAR TWICE DAILY  100 each  11  . pravastatin (PRAVACHOL) 10 MG tablet TAKE ONE TABLET BY MOUTH ONCE DAILY  90 tablet  0  . SIMPLY SALINE NA Place 1 spray into the nose daily.         No current facility-administered medications on file prior to visit.   She has No Known Allergies.  Review of Systems  Review of Systems  Constitutional: Negative for activity change, appetite change and fatigue.  HENT: Negative for hearing loss, congestion, tinnitus and ear discharge.   Eyes: Negative for visual disturbance (see optho q1y -- vision corrected to 20/20 with glasses).  Respiratory: Negative for cough, chest tightness and shortness of breath.   Cardiovascular: Negative for chest pain, palpitations and leg swelling.  Gastrointestinal: Negative for abdominal pain, diarrhea, constipation and abdominal distention.  Genitourinary:  Negative for urgency, frequency, decreased urine volume and difficulty urinating.  Musculoskeletal: Negative for back pain, arthralgias and gait problem.  Skin: Negative for color change, pallor and rash.  Neurological: Negative for dizziness, light-headedness, numbness and headaches.  Hematological: Negative for adenopathy. Does not bruise/bleed easily.  Psychiatric/Behavioral: Negative for suicidal ideas, confusion, sleep disturbance, self-injury, dysphoric mood, decreased concentration and agitation.  Pt is able to read and write and can do all ADLs No risk for falling No abuse/ violence in home      Objective:     Vision by Snellen chart: opth Body mass index is 25.94 kg/(m^2). BP 140/72  Pulse 94  Temp(Src) 98.9 F (37.2 C) (Oral)  Ht 5' 1.5" (1.562 m)  Wt 139 lb 8.8 oz (63.3 kg)  BMI 25.94 kg/m2  SpO2 95%  BP 140/72  Pulse 94  Temp(Src) 98.9 F (37.2 C) (Oral)  Ht 5' 1.5" (1.562 m)  Wt 139 lb 8.8 oz (63.3 kg)  BMI 25.94 kg/m2  SpO2 95% General appearance: alert, cooperative, appears stated age and no distress Head: Normocephalic, without obvious abnormality, atraumatic Eyes: conjunctivae/corneas clear. PERRL, EOM's intact. Fundi benign. Ears: normal TM's and external ear canals both ears Nose: Nares normal. Septum midline. Mucosa normal. No drainage or sinus tenderness. Throat: lips, mucosa, and tongue normal; teeth and gums normal Neck: no adenopathy, no carotid bruit, no JVD, supple, symmetrical, trachea midline and thyroid not enlarged, symmetric, no tenderness/mass/nodules Back: symmetric, no curvature. ROM normal. No CVA tenderness. Lungs: clear to auscultation bilaterally Breasts: normal appearance, no masses or tenderness Heart: S1S2  + murmur Abdomen: soft, non-tender; bowel sounds normal; no masses,  no organomegaly Pelvic: not indicated; status post hysterectomy, negative ROS Extremities: extremities normal, atraumatic, no cyanosis or edema Pulses: 2+  and symmetric Skin: Skin color, texture, turgor normal. No rashes or lesions Lymph nodes: Cervical, supraclavicular, and axillary nodes normal. Neurologic: Alert and oriented X 3, normal strength and tone. Normal symmetric reflexes. Normal coordination and gait Psych-- no depression, no  anxiety      Assessment:     cpe      Plan:     During the course of the visit the patient was educated and counseled about appropriate screening and preventive services including:    Pneumococcal vaccine   Influenza vaccine  Screening mammography  Screening Pap smear and pelvic exam   Bone densitometry screening  Colorectal cancer screening  Glaucoma screening  Advanced directives: has an advanced directive - a copy HAS NOT been provided.  Diet review for nutrition referral? Yes ____  Not Indicated ___x_   Patient Instructions (the written plan) was given to the patient.  Medicare Attestation I have personally reviewed: The patient's medical and social history Their use of alcohol, tobacco or illicit drugs Their current medications and supplements The patient's functional ability including ADLs,fall risks, home safety risks, cognitive, and hearing and visual impairment Diet and physical activities Evidence for depression or mood disorders  The patient's weight, height, BMI, and visual acuity have been recorded in the chart.  I have made referrals, counseling, and provided education to the patient based on review of the above and I have provided the patient with a written personalized care plan for preventive services.    1. Need for prophylactic vaccination and inoculation against influenza  - Flu Vaccine QUAD 36+ mos PF IM (Fluarix Quad PF)  2. Type II or unspecified type diabetes mellitus with peripheral circulatory disorders, uncontrolled(250.72) Check labs... con't meds - Hemoglobin A1c; Future - Microalbumin / creatinine urine ratio; Future - POCT urinalysis dipstick;  Future  3. Unspecified hypothyroidism Check labs  - POCT urinalysis dipstick; Future - TSH; Future  4. Essential hypertension stable - Basic metabolic panel; Future - CBC with Differential; Future - POCT urinalysis dipstick; Future  5. Other and unspecified hyperlipidemia Check labs, con't meds - Hepatic function panel; Future - Lipid panel; Future - POCT urinalysis dipstick; Future  6. Medicare annual wellness visit, subsequent    Garnet Koyanagi, DO   12/28/2013

## 2013-12-29 ENCOUNTER — Other Ambulatory Visit (INDEPENDENT_AMBULATORY_CARE_PROVIDER_SITE_OTHER): Payer: Medicare Other

## 2013-12-29 ENCOUNTER — Encounter: Payer: Medicare Other | Admitting: Family Medicine

## 2013-12-29 DIAGNOSIS — E785 Hyperlipidemia, unspecified: Secondary | ICD-10-CM | POA: Diagnosis not present

## 2013-12-29 DIAGNOSIS — E1159 Type 2 diabetes mellitus with other circulatory complications: Secondary | ICD-10-CM | POA: Diagnosis not present

## 2013-12-29 DIAGNOSIS — I10 Essential (primary) hypertension: Secondary | ICD-10-CM

## 2013-12-29 DIAGNOSIS — E039 Hypothyroidism, unspecified: Secondary | ICD-10-CM | POA: Diagnosis not present

## 2013-12-29 LAB — MICROALBUMIN / CREATININE URINE RATIO
Creatinine,U: 23.7 mg/dL
MICROALB/CREAT RATIO: 3.4 mg/g (ref 0.0–30.0)
Microalb, Ur: 0.8 mg/dL (ref 0.0–1.9)

## 2013-12-29 LAB — LIPID PANEL
CHOLESTEROL: 138 mg/dL (ref 0–200)
HDL: 46 mg/dL (ref >39.00)
LDL Cholesterol: 72 mg/dL (ref 0–99)
NONHDL: 92
Total CHOL/HDL Ratio: 3
Triglycerides: 101 mg/dL (ref 0.0–149.0)
VLDL: 20.2 mg/dL (ref 0.0–40.0)

## 2013-12-29 LAB — BASIC METABOLIC PANEL
BUN: 17 mg/dL (ref 6–23)
CO2: 28 mEq/L (ref 19–32)
Calcium: 9.7 mg/dL (ref 8.4–10.5)
Chloride: 100 mEq/L (ref 96–112)
Creatinine, Ser: 0.8 mg/dL (ref 0.4–1.2)
GFR: 71.2 mL/min (ref >60.00)
Glucose, Bld: 144 mg/dL — ABNORMAL HIGH (ref 70–99)
POTASSIUM: 4.3 meq/L (ref 3.5–5.1)
Sodium: 138 mEq/L (ref 135–145)

## 2013-12-29 LAB — CBC WITH DIFFERENTIAL/PLATELET
Basophils Absolute: 0 10*3/uL (ref 0.0–0.1)
Basophils Relative: 0.5 % (ref 0.0–3.0)
EOS ABS: 0.1 10*3/uL (ref 0.0–0.7)
Eosinophils Relative: 1.2 % (ref 0.0–5.0)
HCT: 40.5 % (ref 36.0–46.0)
Hemoglobin: 13.5 g/dL (ref 12.0–15.0)
LYMPHS PCT: 32.4 % (ref 12.0–46.0)
Lymphs Abs: 2.5 10*3/uL (ref 0.7–4.0)
MCHC: 33.3 g/dL (ref 30.0–36.0)
MCV: 93.5 fl (ref 78.0–100.0)
MONOS PCT: 3.6 % (ref 3.0–12.0)
Monocytes Absolute: 0.3 10*3/uL (ref 0.1–1.0)
Neutro Abs: 4.8 10*3/uL (ref 1.4–7.7)
Neutrophils Relative %: 62.3 % (ref 43.0–77.0)
PLATELETS: 227 10*3/uL (ref 150.0–400.0)
RBC: 4.33 Mil/uL (ref 3.87–5.11)
RDW: 13.7 % (ref 11.5–15.5)
WBC: 7.6 10*3/uL (ref 4.0–10.5)

## 2013-12-29 LAB — POCT URINALYSIS DIPSTICK
BILIRUBIN UA: NEGATIVE
Blood, UA: NEGATIVE
GLUCOSE UA: NEGATIVE
KETONES UA: NEGATIVE
LEUKOCYTES UA: NEGATIVE
NITRITE UA: NEGATIVE
Protein, UA: NEGATIVE
Spec Grav, UA: <=1.005
Urobilinogen, UA: 4
pH, UA: 5.5

## 2013-12-29 LAB — HEPATIC FUNCTION PANEL
ALK PHOS: 43 U/L (ref 39–117)
ALT: 22 U/L (ref 0–35)
AST: 20 U/L (ref 0–37)
Albumin: 4.3 g/dL (ref 3.5–5.2)
Bilirubin, Direct: 0 mg/dL (ref 0.0–0.3)
Total Bilirubin: 0.7 mg/dL (ref 0.2–1.2)
Total Protein: 8 g/dL (ref 6.0–8.3)

## 2013-12-29 LAB — HEMOGLOBIN A1C: HEMOGLOBIN A1C: 7 % — AB (ref 4.6–6.5)

## 2013-12-29 LAB — TSH: TSH: 0.38 u[IU]/mL (ref 0.35–4.50)

## 2014-01-03 DIAGNOSIS — J309 Allergic rhinitis, unspecified: Secondary | ICD-10-CM | POA: Diagnosis not present

## 2014-01-24 ENCOUNTER — Other Ambulatory Visit: Payer: Self-pay | Admitting: Family Medicine

## 2014-01-24 DIAGNOSIS — J3081 Allergic rhinitis due to animal (cat) (dog) hair and dander: Secondary | ICD-10-CM | POA: Diagnosis not present

## 2014-01-24 DIAGNOSIS — J301 Allergic rhinitis due to pollen: Secondary | ICD-10-CM | POA: Diagnosis not present

## 2014-01-24 DIAGNOSIS — J3089 Other allergic rhinitis: Secondary | ICD-10-CM | POA: Diagnosis not present

## 2014-01-26 DIAGNOSIS — J3089 Other allergic rhinitis: Secondary | ICD-10-CM | POA: Diagnosis not present

## 2014-01-26 DIAGNOSIS — J301 Allergic rhinitis due to pollen: Secondary | ICD-10-CM | POA: Diagnosis not present

## 2014-02-01 DIAGNOSIS — J301 Allergic rhinitis due to pollen: Secondary | ICD-10-CM | POA: Diagnosis not present

## 2014-02-01 DIAGNOSIS — J3089 Other allergic rhinitis: Secondary | ICD-10-CM | POA: Diagnosis not present

## 2014-02-04 DIAGNOSIS — J3089 Other allergic rhinitis: Secondary | ICD-10-CM | POA: Diagnosis not present

## 2014-02-04 DIAGNOSIS — J301 Allergic rhinitis due to pollen: Secondary | ICD-10-CM | POA: Diagnosis not present

## 2014-02-04 DIAGNOSIS — J3081 Allergic rhinitis due to animal (cat) (dog) hair and dander: Secondary | ICD-10-CM | POA: Diagnosis not present

## 2014-02-09 DIAGNOSIS — J3089 Other allergic rhinitis: Secondary | ICD-10-CM | POA: Diagnosis not present

## 2014-02-09 DIAGNOSIS — J301 Allergic rhinitis due to pollen: Secondary | ICD-10-CM | POA: Diagnosis not present

## 2014-02-14 DIAGNOSIS — Z1231 Encounter for screening mammogram for malignant neoplasm of breast: Secondary | ICD-10-CM | POA: Diagnosis not present

## 2014-02-25 DIAGNOSIS — J3081 Allergic rhinitis due to animal (cat) (dog) hair and dander: Secondary | ICD-10-CM | POA: Diagnosis not present

## 2014-02-25 DIAGNOSIS — J301 Allergic rhinitis due to pollen: Secondary | ICD-10-CM | POA: Diagnosis not present

## 2014-02-25 DIAGNOSIS — J3089 Other allergic rhinitis: Secondary | ICD-10-CM | POA: Diagnosis not present

## 2014-03-15 ENCOUNTER — Telehealth: Payer: Self-pay | Admitting: Family Medicine

## 2014-03-15 MED ORDER — OMEPRAZOLE 40 MG PO CPDR: 40.0000 mg | DELAYED_RELEASE_CAPSULE | Freq: Every day | ORAL | Status: DC

## 2014-03-15 MED ORDER — CETIRIZINE HCL 10 MG PO TABS: 10.0000 mg | ORAL_TABLET | Freq: Every day | ORAL | Status: DC

## 2014-03-15 NOTE — Telephone Encounter (Signed)
Caller name: Bushra, Mock Relation to pt: spouse  Call back number: [phone] [facility]: [facility] [phone]  Reason for call:  Pt requesting a refill omeprazole (PRILOSEC) 40 MG capsule & cetirizine (ZYRTEC) 10 MG tablet

## 2014-03-23 DIAGNOSIS — J301 Allergic rhinitis due to pollen: Secondary | ICD-10-CM | POA: Diagnosis not present

## 2014-03-23 DIAGNOSIS — J3081 Allergic rhinitis due to animal (cat) (dog) hair and dander: Secondary | ICD-10-CM | POA: Diagnosis not present

## 2014-03-23 DIAGNOSIS — J3089 Other allergic rhinitis: Secondary | ICD-10-CM | POA: Diagnosis not present

## 2014-03-28 DIAGNOSIS — E119 Type 2 diabetes mellitus without complications: Secondary | ICD-10-CM | POA: Diagnosis not present

## 2014-03-28 DIAGNOSIS — H40023 Open angle with borderline findings, high risk, bilateral: Secondary | ICD-10-CM | POA: Diagnosis not present

## 2014-03-28 DIAGNOSIS — H2513 Age-related nuclear cataract, bilateral: Secondary | ICD-10-CM | POA: Diagnosis not present

## 2014-03-28 DIAGNOSIS — H35033 Hypertensive retinopathy, bilateral: Secondary | ICD-10-CM | POA: Diagnosis not present

## 2014-04-01 ENCOUNTER — Other Ambulatory Visit: Payer: Self-pay | Admitting: Family Medicine

## 2014-04-01 DIAGNOSIS — E785 Hyperlipidemia, unspecified: Secondary | ICD-10-CM

## 2014-04-01 DIAGNOSIS — E038 Other specified hypothyroidism: Secondary | ICD-10-CM

## 2014-04-01 DIAGNOSIS — IMO0002 Reserved for concepts with insufficient information to code with codable children: Secondary | ICD-10-CM

## 2014-04-01 DIAGNOSIS — E1165 Type 2 diabetes mellitus with hyperglycemia: Secondary | ICD-10-CM

## 2014-04-08 ENCOUNTER — Encounter: Payer: Self-pay | Admitting: Family Medicine

## 2014-04-14 DIAGNOSIS — J3089 Other allergic rhinitis: Secondary | ICD-10-CM | POA: Diagnosis not present

## 2014-04-14 DIAGNOSIS — J3081 Allergic rhinitis due to animal (cat) (dog) hair and dander: Secondary | ICD-10-CM | POA: Diagnosis not present

## 2014-04-14 DIAGNOSIS — J301 Allergic rhinitis due to pollen: Secondary | ICD-10-CM | POA: Diagnosis not present

## 2014-04-22 HISTORY — PX: BACK SURGERY: SHX140

## 2014-05-02 ENCOUNTER — Other Ambulatory Visit: Payer: Self-pay | Admitting: *Deleted

## 2014-05-02 MED ORDER — FREESTYLE LANCETS MISC: Status: DC

## 2014-05-16 ENCOUNTER — Telehealth: Payer: Self-pay | Admitting: Family Medicine

## 2014-05-16 DIAGNOSIS — J301 Allergic rhinitis due to pollen: Secondary | ICD-10-CM | POA: Diagnosis not present

## 2014-05-16 DIAGNOSIS — J3089 Other allergic rhinitis: Secondary | ICD-10-CM | POA: Diagnosis not present

## 2014-05-16 DIAGNOSIS — J3081 Allergic rhinitis due to animal (cat) (dog) hair and dander: Secondary | ICD-10-CM | POA: Diagnosis not present

## 2014-05-16 MED ORDER — AMLODIPINE BESYLATE 5 MG PO TABS: 5.0000 mg | ORAL_TABLET | Freq: Every day | ORAL | Status: DC

## 2014-05-16 MED ORDER — GLUCOSE BLOOD VI STRP: ORAL_STRIP | Status: DC

## 2014-05-16 NOTE — Telephone Encounter (Signed)
They have the lancets. Just need to send in the amlodipine and one touch ultra blue strips

## 2014-05-16 NOTE — Telephone Encounter (Signed)
Caller name: Persephanie Brimberry Relationship to patient:Self Can be [phone] [facility]acy:[facility] [address]  Reason for call: Pre auth for RX Amlodiphine 5mg  quantity 90 Test  Strips Freestyle lancets needing code

## 2014-05-16 NOTE — Telephone Encounter (Signed)
Caller name:Aleecia Mihalko Relationship to patient:Self Can be [place] [address]   Reason for call: Pre authorization  Amlodipine 5mg  quantity 90  Free style lancets One touch ultra blue strips

## 2014-05-16 NOTE — Telephone Encounter (Signed)
Pharmacy is stating that this does not require a prior auth. They just need refills.  Cheryl Cisneros at [facility]- [phone]

## 2014-05-16 NOTE — Telephone Encounter (Signed)
error 

## 2014-05-16 NOTE — Telephone Encounter (Signed)
Rx faxed.    KP 

## 2014-05-16 NOTE — Addendum Note (Signed)
Addended by: Ewing Schlein on: 05/16/2014 03:19 PM   Modules accepted: Orders

## 2014-05-19 DIAGNOSIS — J3081 Allergic rhinitis due to animal (cat) (dog) hair and dander: Secondary | ICD-10-CM | POA: Diagnosis not present

## 2014-05-19 DIAGNOSIS — J3089 Other allergic rhinitis: Secondary | ICD-10-CM | POA: Diagnosis not present

## 2014-05-31 DIAGNOSIS — E119 Type 2 diabetes mellitus without complications: Secondary | ICD-10-CM | POA: Diagnosis not present

## 2014-05-31 DIAGNOSIS — H40023 Open angle with borderline findings, high risk, bilateral: Secondary | ICD-10-CM | POA: Diagnosis not present

## 2014-06-16 DIAGNOSIS — J301 Allergic rhinitis due to pollen: Secondary | ICD-10-CM | POA: Diagnosis not present

## 2014-06-16 DIAGNOSIS — J3089 Other allergic rhinitis: Secondary | ICD-10-CM | POA: Diagnosis not present

## 2014-06-16 DIAGNOSIS — J3081 Allergic rhinitis due to animal (cat) (dog) hair and dander: Secondary | ICD-10-CM | POA: Diagnosis not present

## 2014-06-20 DIAGNOSIS — M542 Cervicalgia: Secondary | ICD-10-CM | POA: Diagnosis not present

## 2014-06-20 DIAGNOSIS — J301 Allergic rhinitis due to pollen: Secondary | ICD-10-CM | POA: Diagnosis not present

## 2014-06-20 DIAGNOSIS — M9902 Segmental and somatic dysfunction of thoracic region: Secondary | ICD-10-CM | POA: Diagnosis not present

## 2014-06-20 DIAGNOSIS — J3081 Allergic rhinitis due to animal (cat) (dog) hair and dander: Secondary | ICD-10-CM | POA: Diagnosis not present

## 2014-06-20 DIAGNOSIS — M9901 Segmental and somatic dysfunction of cervical region: Secondary | ICD-10-CM | POA: Diagnosis not present

## 2014-06-20 DIAGNOSIS — M546 Pain in thoracic spine: Secondary | ICD-10-CM | POA: Diagnosis not present

## 2014-06-20 DIAGNOSIS — J3089 Other allergic rhinitis: Secondary | ICD-10-CM | POA: Diagnosis not present

## 2014-06-21 DIAGNOSIS — M9902 Segmental and somatic dysfunction of thoracic region: Secondary | ICD-10-CM | POA: Diagnosis not present

## 2014-06-21 DIAGNOSIS — M542 Cervicalgia: Secondary | ICD-10-CM | POA: Diagnosis not present

## 2014-06-21 DIAGNOSIS — M9901 Segmental and somatic dysfunction of cervical region: Secondary | ICD-10-CM | POA: Diagnosis not present

## 2014-06-21 DIAGNOSIS — M546 Pain in thoracic spine: Secondary | ICD-10-CM | POA: Diagnosis not present

## 2014-06-22 DIAGNOSIS — J301 Allergic rhinitis due to pollen: Secondary | ICD-10-CM | POA: Diagnosis not present

## 2014-06-22 DIAGNOSIS — M9902 Segmental and somatic dysfunction of thoracic region: Secondary | ICD-10-CM | POA: Diagnosis not present

## 2014-06-22 DIAGNOSIS — M546 Pain in thoracic spine: Secondary | ICD-10-CM | POA: Diagnosis not present

## 2014-06-22 DIAGNOSIS — M542 Cervicalgia: Secondary | ICD-10-CM | POA: Diagnosis not present

## 2014-06-22 DIAGNOSIS — J3081 Allergic rhinitis due to animal (cat) (dog) hair and dander: Secondary | ICD-10-CM | POA: Diagnosis not present

## 2014-06-22 DIAGNOSIS — J3089 Other allergic rhinitis: Secondary | ICD-10-CM | POA: Diagnosis not present

## 2014-06-22 DIAGNOSIS — M9901 Segmental and somatic dysfunction of cervical region: Secondary | ICD-10-CM | POA: Diagnosis not present

## 2014-06-27 DIAGNOSIS — J3081 Allergic rhinitis due to animal (cat) (dog) hair and dander: Secondary | ICD-10-CM | POA: Diagnosis not present

## 2014-06-27 DIAGNOSIS — J3089 Other allergic rhinitis: Secondary | ICD-10-CM | POA: Diagnosis not present

## 2014-06-27 DIAGNOSIS — M9901 Segmental and somatic dysfunction of cervical region: Secondary | ICD-10-CM | POA: Diagnosis not present

## 2014-06-27 DIAGNOSIS — M9902 Segmental and somatic dysfunction of thoracic region: Secondary | ICD-10-CM | POA: Diagnosis not present

## 2014-06-27 DIAGNOSIS — J301 Allergic rhinitis due to pollen: Secondary | ICD-10-CM | POA: Diagnosis not present

## 2014-06-27 DIAGNOSIS — M546 Pain in thoracic spine: Secondary | ICD-10-CM | POA: Diagnosis not present

## 2014-06-27 DIAGNOSIS — M542 Cervicalgia: Secondary | ICD-10-CM | POA: Diagnosis not present

## 2014-06-28 DIAGNOSIS — M9902 Segmental and somatic dysfunction of thoracic region: Secondary | ICD-10-CM | POA: Diagnosis not present

## 2014-06-28 DIAGNOSIS — M546 Pain in thoracic spine: Secondary | ICD-10-CM | POA: Diagnosis not present

## 2014-06-28 DIAGNOSIS — M9901 Segmental and somatic dysfunction of cervical region: Secondary | ICD-10-CM | POA: Diagnosis not present

## 2014-06-28 DIAGNOSIS — M542 Cervicalgia: Secondary | ICD-10-CM | POA: Diagnosis not present

## 2014-06-30 ENCOUNTER — Ambulatory Visit: Payer: Medicare Other | Admitting: Family Medicine

## 2014-06-30 DIAGNOSIS — J3081 Allergic rhinitis due to animal (cat) (dog) hair and dander: Secondary | ICD-10-CM | POA: Diagnosis not present

## 2014-06-30 DIAGNOSIS — M9902 Segmental and somatic dysfunction of thoracic region: Secondary | ICD-10-CM | POA: Diagnosis not present

## 2014-06-30 DIAGNOSIS — J3089 Other allergic rhinitis: Secondary | ICD-10-CM | POA: Diagnosis not present

## 2014-06-30 DIAGNOSIS — M546 Pain in thoracic spine: Secondary | ICD-10-CM | POA: Diagnosis not present

## 2014-06-30 DIAGNOSIS — M542 Cervicalgia: Secondary | ICD-10-CM | POA: Diagnosis not present

## 2014-06-30 DIAGNOSIS — J301 Allergic rhinitis due to pollen: Secondary | ICD-10-CM | POA: Diagnosis not present

## 2014-06-30 DIAGNOSIS — M9901 Segmental and somatic dysfunction of cervical region: Secondary | ICD-10-CM | POA: Diagnosis not present

## 2014-07-01 ENCOUNTER — Ambulatory Visit: Payer: Medicare Other | Admitting: Family Medicine

## 2014-07-04 DIAGNOSIS — M9902 Segmental and somatic dysfunction of thoracic region: Secondary | ICD-10-CM | POA: Diagnosis not present

## 2014-07-04 DIAGNOSIS — M9901 Segmental and somatic dysfunction of cervical region: Secondary | ICD-10-CM | POA: Diagnosis not present

## 2014-07-04 DIAGNOSIS — M546 Pain in thoracic spine: Secondary | ICD-10-CM | POA: Diagnosis not present

## 2014-07-04 DIAGNOSIS — M542 Cervicalgia: Secondary | ICD-10-CM | POA: Diagnosis not present

## 2014-07-05 DIAGNOSIS — M9902 Segmental and somatic dysfunction of thoracic region: Secondary | ICD-10-CM | POA: Diagnosis not present

## 2014-07-05 DIAGNOSIS — M9901 Segmental and somatic dysfunction of cervical region: Secondary | ICD-10-CM | POA: Diagnosis not present

## 2014-07-05 DIAGNOSIS — M546 Pain in thoracic spine: Secondary | ICD-10-CM | POA: Diagnosis not present

## 2014-07-05 DIAGNOSIS — M542 Cervicalgia: Secondary | ICD-10-CM | POA: Diagnosis not present

## 2014-07-07 DIAGNOSIS — M542 Cervicalgia: Secondary | ICD-10-CM | POA: Diagnosis not present

## 2014-07-07 DIAGNOSIS — M546 Pain in thoracic spine: Secondary | ICD-10-CM | POA: Diagnosis not present

## 2014-07-07 DIAGNOSIS — M9901 Segmental and somatic dysfunction of cervical region: Secondary | ICD-10-CM | POA: Diagnosis not present

## 2014-07-07 DIAGNOSIS — M9902 Segmental and somatic dysfunction of thoracic region: Secondary | ICD-10-CM | POA: Diagnosis not present

## 2014-07-11 DIAGNOSIS — M9901 Segmental and somatic dysfunction of cervical region: Secondary | ICD-10-CM | POA: Diagnosis not present

## 2014-07-11 DIAGNOSIS — M542 Cervicalgia: Secondary | ICD-10-CM | POA: Diagnosis not present

## 2014-07-11 DIAGNOSIS — M9902 Segmental and somatic dysfunction of thoracic region: Secondary | ICD-10-CM | POA: Diagnosis not present

## 2014-07-11 DIAGNOSIS — M546 Pain in thoracic spine: Secondary | ICD-10-CM | POA: Diagnosis not present

## 2014-07-18 ENCOUNTER — Ambulatory Visit (INDEPENDENT_AMBULATORY_CARE_PROVIDER_SITE_OTHER): Payer: Medicare Other | Admitting: Family Medicine

## 2014-07-18 ENCOUNTER — Encounter: Payer: Self-pay | Admitting: Family Medicine

## 2014-07-18 VITALS — BP 138/70 | HR 100 | Temp 98.5°F | Wt 142.6 lb

## 2014-07-18 DIAGNOSIS — E039 Hypothyroidism, unspecified: Secondary | ICD-10-CM

## 2014-07-18 DIAGNOSIS — E119 Type 2 diabetes mellitus without complications: Secondary | ICD-10-CM

## 2014-07-18 DIAGNOSIS — I10 Essential (primary) hypertension: Secondary | ICD-10-CM

## 2014-07-18 DIAGNOSIS — M5442 Lumbago with sciatica, left side: Secondary | ICD-10-CM | POA: Diagnosis not present

## 2014-07-18 DIAGNOSIS — E785 Hyperlipidemia, unspecified: Secondary | ICD-10-CM | POA: Diagnosis not present

## 2014-07-18 LAB — HEPATIC FUNCTION PANEL
ALK PHOS: 42 U/L (ref 39–117)
ALT: 16 U/L (ref 0–35)
AST: 12 U/L (ref 0–37)
Albumin: 4.5 g/dL (ref 3.5–5.2)
Bilirubin, Direct: 0.1 mg/dL (ref 0.0–0.3)
Total Bilirubin: 0.6 mg/dL (ref 0.2–1.2)
Total Protein: 7.9 g/dL (ref 6.0–8.3)

## 2014-07-18 LAB — BASIC METABOLIC PANEL
BUN: 12 mg/dL (ref 6–23)
CHLORIDE: 101 meq/L (ref 96–112)
CO2: 27 mEq/L (ref 19–32)
CREATININE: 0.82 mg/dL (ref 0.40–1.20)
Calcium: 9.9 mg/dL (ref 8.4–10.5)
GFR: 73.09 mL/min (ref >60.00)
Glucose, Bld: 145 mg/dL — ABNORMAL HIGH (ref 70–99)
POTASSIUM: 4.6 meq/L (ref 3.5–5.1)
Sodium: 138 mEq/L (ref 135–145)

## 2014-07-18 LAB — POCT URINALYSIS DIPSTICK
Bilirubin, UA: NEGATIVE
Blood, UA: NEGATIVE
Glucose, UA: NEGATIVE
KETONES UA: NEGATIVE
LEUKOCYTES UA: NEGATIVE
Nitrite, UA: NEGATIVE
PH UA: 6
Protein, UA: NEGATIVE
SPEC GRAV UA: 1.01
Urobilinogen, UA: 0.2

## 2014-07-18 LAB — MICROALBUMIN / CREATININE URINE RATIO
Creatinine,U: 23.2 mg/dL
MICROALB UR: 0.8 mg/dL (ref 0.0–1.9)
Microalb Creat Ratio: 3.5 mg/g (ref 0.0–30.0)

## 2014-07-18 LAB — LIPID PANEL
CHOL/HDL RATIO: 3
Cholesterol: 129 mg/dL (ref 0–200)
HDL: 50.2 mg/dL (ref >39.00)
LDL Cholesterol: 57 mg/dL (ref 0–99)
NonHDL: 78.8
TRIGLYCERIDES: 108 mg/dL (ref 0.0–149.0)
VLDL: 21.6 mg/dL (ref 0.0–40.0)

## 2014-07-18 LAB — HEMOGLOBIN A1C: HEMOGLOBIN A1C: 6.8 % — AB (ref 4.6–6.5)

## 2014-07-18 MED ORDER — AMLODIPINE BESYLATE 5 MG PO TABS: 5.0000 mg | ORAL_TABLET | Freq: Every day | ORAL | Status: DC

## 2014-07-18 MED ORDER — ASPIRIN EC 81 MG PO TBEC: 81.0000 mg | DELAYED_RELEASE_TABLET | Freq: Every day | ORAL | Status: AC

## 2014-07-18 MED ORDER — HYDROCODONE-ACETAMINOPHEN 5-325 MG PO TABS: 1.0000 | ORAL_TABLET | Freq: Four times a day (QID) | ORAL | Status: DC | PRN

## 2014-07-18 MED ORDER — METFORMIN HCL 1000 MG PO TABS: 1000.0000 mg | ORAL_TABLET | Freq: Two times a day (BID) | ORAL | Status: DC

## 2014-07-18 MED ORDER — CYCLOBENZAPRINE HCL 10 MG PO TABS: 10.0000 mg | ORAL_TABLET | Freq: Three times a day (TID) | ORAL | Status: DC | PRN

## 2014-07-18 MED ORDER — LEVOTHYROXINE SODIUM 112 MCG PO TABS: 112.0000 ug | ORAL_TABLET | Freq: Every day | ORAL | Status: DC

## 2014-07-18 MED ORDER — GLUCOSE BLOOD VI STRP: ORAL_STRIP | Status: DC

## 2014-07-18 MED ORDER — PRAVASTATIN SODIUM 10 MG PO TABS: 10.0000 mg | ORAL_TABLET | Freq: Every day | ORAL | Status: DC

## 2014-07-18 NOTE — Patient Instructions (Signed)
Diabetes and Standards of Medical Care Diabetes is complicated. You may find that your diabetes team includes a dietitian, nurse, diabetes educator, eye doctor, and more. To help everyone know what is going on and to help you get the care you deserve, the following schedule of care was developed to help keep you on track. Below are the tests, exams, vaccines, medicines, education, and plans you will need. HbA1c test This test shows how well you have controlled your glucose over the past 2-3 months. It is used to see if your diabetes management plan needs to be adjusted.   It is performed at least 2 times a year if you are meeting treatment goals.  It is performed 4 times a year if therapy has changed or if you are not meeting treatment goals. Blood pressure test  This test is performed at every routine medical visit. The goal is less than 140/90 mm Hg for most people, but 130/80 mm Hg in some cases. Ask your health care provider about your goal. Dental exam  Follow up with the dentist regularly. Eye exam  If you are diagnosed with type 1 diabetes as a child, get an exam upon reaching the age of 37 years or older and have had diabetes for 3-5 years. Yearly eye exams are recommended after that initial eye exam.  If you are diagnosed with type 1 diabetes as an adult, get an exam within 5 years of diagnosis and then yearly.  If you are diagnosed with type 2 diabetes, get an exam as soon as possible after the diagnosis and then yearly. Foot care exam  Visual foot exams are performed at every routine medical visit. The exams check for cuts, injuries, or other problems with the feet.  A comprehensive foot exam should be done yearly. This includes visual inspection as well as assessing foot pulses and testing for loss of sensation.  Check your feet nightly for cuts, injuries, or other problems with your feet. Tell your health care provider if anything is not healing. Kidney function test (urine  microalbumin)  This test is performed once a year.  Type 1 diabetes: The first test is performed 5 years after diagnosis.  Type 2 diabetes: The first test is performed at the time of diagnosis.  A serum creatinine and estimated glomerular filtration rate (eGFR) test is done once a year to assess the level of chronic kidney disease (CKD), if present. Lipid profile (cholesterol, HDL, LDL, triglycerides)  Performed every 5 years for most people.  The goal for LDL is less than 100 mg/dL. If you are at high risk, the goal is less than 70 mg/dL.  The goal for HDL is 40 mg/dL-50 mg/dL for men and 50 mg/dL-60 mg/dL for women. An HDL cholesterol of 60 mg/dL or higher gives some protection against heart disease.  The goal for triglycerides is less than 150 mg/dL. Influenza vaccine, pneumococcal vaccine, and hepatitis B vaccine  The influenza vaccine is recommended yearly.  It is recommended that people with diabetes who are over 24 years old get the pneumonia vaccine. In some cases, two separate shots may be given. Ask your health care provider if your pneumonia vaccination is up to date.  The hepatitis B vaccine is also recommended for adults with diabetes. Diabetes self-management education  Education is recommended at diagnosis and ongoing as needed. Treatment plan  Your treatment plan is reviewed at every medical visit. Document Released: 02/03/2009 Document Revised: 08/23/2013 Document Reviewed: 09/08/2012 [facility] Patient Information 2015 [place],  [employer]. This information is not intended to replace advice given to you by your health care provider. Make sure you discuss any questions you have with your health care provider.  

## 2014-07-18 NOTE — Progress Notes (Signed)
Pre visit review using our clinic review tool, if applicable. No additional management support is needed unless otherwise documented below in the visit note. 

## 2014-07-18 NOTE — Progress Notes (Signed)
Patient ID: Cheryl Cisneros, female    DOB: Oct 10, 1945  Age: 69 y.o. MRN: [id number]    Subjective:  Subjective HPI Cheryl Cisneros presents for f/u dm, bp and cholesterol  She is also c/o low back pain that started oct/nov--- she has been going to chiropractor 3x a week for the last month with no relief.  Pain is now going into L hip.  Pt pain with all movement and weight bearing.  It has been progressively worsening over the last few months.  Chiropractor has recommended a MRI.    HPI HYPERTENSION  Blood pressure range-not checking   Chest pain- no      Dyspnea- no Lightheadedness- no   Edema- no Other side effects - no   Medication compliance: good Low salt diet- yes  DIABETES  Blood Sugar ranges-120-140  Polyuria- no New Visual problems- no Hypoglycemic symptoms- no Other side effects-no Medication compliance - good Last eye exam- Jan 2016 Foot exam- today  HYPERLIPIDEMIA  Medication compliance- good RUQ pain- mo  Muscle aches- mo Other side effects-mo    Review of Systems  Constitutional: Negative for diaphoresis, activity change, appetite change, fatigue and unexpected weight change.  Eyes: Negative for pain, redness and visual disturbance.  Respiratory: Negative for cough, chest tightness, shortness of breath and wheezing.   Cardiovascular: Negative for chest pain, palpitations and leg swelling.  Endocrine: Negative for cold intolerance, heat intolerance, polydipsia, polyphagia and polyuria.  Genitourinary: Negative for dysuria, frequency and difficulty urinating.  Musculoskeletal: Positive for back pain.  Neurological: Negative for dizziness, light-headedness, numbness and headaches.  Psychiatric/Behavioral: Negative for behavioral problems and dysphoric mood. The patient is not nervous/anxious.    History   Social History  . Marital Status: Married    Spouse Name: N/A  . Number of Children: N/A  . Years of Education: N/A   Occupational History  .  housewife    Social History Main Topics  . Smoking status: Never Smoker   . Smokeless tobacco: Never Used  . Alcohol Use: No  . Drug Use: No  . Sexual Activity:    Partners: Male   Other Topics Concern  . Not on file   Social History Narrative   Exercise--  [employer] 3 days a week and walking   Current Outpatient Prescriptions  Medication Sig Dispense Refill  . AMBULATORY NON FORMULARY MEDICATION Diabetic Shoes to use daily. Dx 250.00 2 each 0  . amLODipine (NORVASC) 5 MG tablet Take 1 tablet (5 mg total) by mouth daily. 90 tablet 1  . Biotin 1000 MCG tablet Take 1,000 mcg by mouth daily.     . Calcium-Vitamin D 600-200 MG-UNIT per tablet Take 1 tablet by mouth 3 (three) times daily with meals.      . cetirizine (ZYRTEC) 10 MG tablet Take 1 tablet (10 mg total) by mouth daily. 90 tablet 3  . Cholecalciferol (VITAMIN D3) 1000 UNITS CAPS Take 1 capsule by mouth daily.      Marland Kitchen EPIPEN 2-PAK 0.3 MG/0.3ML DEVI     . Garlic Oil (SUPER GARLIC) 123XX123 MG CAPS Take 1 capsule by mouth daily.     Marland Kitchen glucose blood (ONE TOUCH ULTRA TEST) test strip Check blood sugar bid E11.9 100 each 11  . L-Lysine 500 MG TABS Take 1 tablet by mouth daily.      . Lancets (FREESTYLE) lancets TEST BLOOD SUGAR TWICE DAILY. Dx code: E11.9 200 each 1  . levothyroxine (SYNTHROID, LEVOTHROID) 112 MCG tablet Take 1 tablet (112  mcg total) by mouth daily. 90 tablet 1  . Magnesium 250 MG TABS Take 1 tablet by mouth 2 (two) times daily.      . metFORMIN (GLUCOPHAGE) 1000 MG tablet Take 1 tablet (1,000 mg total) by mouth 2 (two) times daily with a meal. 180 tablet 1  . Omega-3 Fatty Acids (OMEGA 3 PO) Take 1 tablet by mouth daily.      Marland Kitchen omeprazole (PRILOSEC) 40 MG capsule Take 1 capsule (40 mg total) by mouth daily. 90 capsule 3  . OVER THE COUNTER MEDICATION Maxi-vision 2 drops per day    . pravastatin (PRAVACHOL) 10 MG tablet Take 1 tablet (10 mg total) by mouth daily. 90 tablet 1  . SIMPLY SALINE NA Place 1 spray into the  nose daily.      . Turmeric 500 MG CAPS Take 1 capsule by mouth 2 (two) times daily.    . vitamin C (ASCORBIC ACID) 500 MG tablet Take 1,000 mg by mouth daily.    Marland Kitchen aspirin EC 81 MG tablet Take 1 tablet (81 mg total) by mouth daily.    . cyclobenzaprine (FLEXERIL) 10 MG tablet Take 1 tablet (10 mg total) by mouth 3 (three) times daily as needed for muscle spasms. 30 tablet 0  . HYDROcodone-acetaminophen (NORCO/VICODIN) 5-325 MG per tablet Take 1 tablet by mouth every 6 (six) hours as needed for moderate pain. 30 tablet 0   No current facility-administered medications for this visit.    History Past Medical History  Diagnosis Date  . Hypertension   . Allergy   . GERD (gastroesophageal reflux disease)   . Thyroid disease     Hypothyroidism  . Asthma   . Macular degeneration 12/25/2011  . Cataract     She has past surgical history that includes Tonsillectomy; Knee arthroscopy; Lumbar laminectomy (1999); Rotator cuff repair (02/27/2005); and Abdominal hysterectomy (1986).   Her family history includes Cancer in her father; Coronary artery disease in an other family member; Dementia in her sister; Diabetes in her brother, father, mother, sister, and sister; Hyperlipidemia in her brother, sister, and sister; Hypertension in her brother, father, mother, sister, and sister; Kidney disease in her mother; Lung cancer in an other family member.She reports that she has never smoked. She has never used smokeless tobacco. She reports that she does not drink alcohol or use illicit drugs.  Current Outpatient Prescriptions on File Prior to Visit  Medication Sig Dispense Refill  . AMBULATORY NON FORMULARY MEDICATION Diabetic Shoes to use daily. Dx 250.00 2 each 0  . Biotin 1000 MCG tablet Take 1,000 mcg by mouth daily.     . Calcium-Vitamin D 600-200 MG-UNIT per tablet Take 1 tablet by mouth 3 (three) times daily with meals.      . cetirizine (ZYRTEC) 10 MG tablet Take 1 tablet (10 mg total) by mouth  daily. 90 tablet 3  . Cholecalciferol (VITAMIN D3) 1000 UNITS CAPS Take 1 capsule by mouth daily.      Marland Kitchen EPIPEN 2-PAK 0.3 MG/0.3ML DEVI     . Garlic Oil (SUPER GARLIC) 123XX123 MG CAPS Take 1 capsule by mouth daily.     Marland Kitchen L-Lysine 500 MG TABS Take 1 tablet by mouth daily.      . Lancets (FREESTYLE) lancets TEST BLOOD SUGAR TWICE DAILY. Dx code: E11.9 200 each 1  . Magnesium 250 MG TABS Take 1 tablet by mouth 2 (two) times daily.      . Omega-3 Fatty Acids (OMEGA 3 PO) Take 1  tablet by mouth daily.      Marland Kitchen omeprazole (PRILOSEC) 40 MG capsule Take 1 capsule (40 mg total) by mouth daily. 90 capsule 3  . OVER THE COUNTER MEDICATION Maxi-vision 2 drops per day    . SIMPLY SALINE NA Place 1 spray into the nose daily.      . Turmeric 500 MG CAPS Take 1 capsule by mouth 2 (two) times daily.    . vitamin C (ASCORBIC ACID) 500 MG tablet Take 1,000 mg by mouth daily.     No current facility-administered medications on file prior to visit.     Objective:  Objective Physical Exam  Constitutional: She is oriented to person, place, and time. She appears well-developed and well-nourished.  HENT:  Head: Normocephalic and atraumatic.  Eyes: Conjunctivae and EOM are normal.  Neck: Normal range of motion. Neck supple. No JVD present. Carotid bruit is not present. No thyromegaly present.  Cardiovascular: Normal rate, regular rhythm and normal heart sounds.   No murmur heard. Pulmonary/Chest: Effort normal and breath sounds normal. No respiratory distress. She has no wheezes. She has no rales. She exhibits no tenderness.  Musculoskeletal: She exhibits tenderness. She exhibits no edema.  Neurological: She is alert and oriented to person, place, and time.  + slightl weakness with dorsiflexion of foot and big toe R foot  Psychiatric: She has a normal mood and affect.   BP 138/70 mmHg  Pulse 100  Temp(Src) 98.5 F (36.9 C) (Oral)  Wt 142 lb 9.6 oz (64.683 kg)  SpO2 98% Wt Readings from Last 3 Encounters:    07/18/14 142 lb 9.6 oz (64.683 kg)  12/28/13 139 lb 8.8 oz (63.3 kg)  06/24/13 146 lb (66.225 kg)     Lab Results  Component Value Date   WBC 7.6 12/29/2013   HGB 13.5 12/29/2013   HCT 40.5 12/29/2013   PLT 227.0 12/29/2013   GLUCOSE 144* 12/29/2013   CHOL 138 12/29/2013   TRIG 101.0 12/29/2013   HDL 46.00 12/29/2013   LDLCALC 72 12/29/2013   ALT 22 12/29/2013   AST 20 12/29/2013   NA 138 12/29/2013   K 4.3 12/29/2013   CL 100 12/29/2013   CREATININE 0.8 12/29/2013   BUN 17 12/29/2013   CO2 28 12/29/2013   TSH 0.38 12/29/2013   HGBA1C 7.0* 12/29/2013   MICROALBUR 0.8 12/29/2013    No results found.   Assessment & Plan:  Plan I have changed Cheryl Cisneros's glucose blood, metFORMIN, levothyroxine, and pravastatin. I am also having her start on aspirin EC, cyclobenzaprine, and HYDROcodone-acetaminophen. Additionally, I am having her maintain her Calcium-Vitamin D, L-Lysine, Magnesium, Omega-3 Fatty Acids (OMEGA 3 PO), Garlic Oil, Vitamin D3, SIMPLY SALINE NA, EPIPEN 2-PAK, AMBULATORY NON FORMULARY MEDICATION, Biotin, OVER THE COUNTER MEDICATION, Turmeric, vitamin C, omeprazole, cetirizine, freestyle, and amLODipine.  Meds ordered this encounter  Medications  . glucose blood (ONE TOUCH ULTRA TEST) test strip    Sig: Check blood sugar bid E11.9    Dispense:  100 each    Refill:  11  . aspirin EC 81 MG tablet    Sig: Take 1 tablet (81 mg total) by mouth daily.  . cyclobenzaprine (FLEXERIL) 10 MG tablet    Sig: Take 1 tablet (10 mg total) by mouth 3 (three) times daily as needed for muscle spasms.    Dispense:  30 tablet    Refill:  0  . HYDROcodone-acetaminophen (NORCO/VICODIN) 5-325 MG per tablet    Sig: Take 1 tablet by mouth  every 6 (six) hours as needed for moderate pain.    Dispense:  30 tablet    Refill:  0  . amLODipine (NORVASC) 5 MG tablet    Sig: Take 1 tablet (5 mg total) by mouth daily.    Dispense:  90 tablet    Refill:  1  . metFORMIN (GLUCOPHAGE) 1000  MG tablet    Sig: Take 1 tablet (1,000 mg total) by mouth 2 (two) times daily with a meal.    Dispense:  180 tablet    Refill:  1  . levothyroxine (SYNTHROID, LEVOTHROID) 112 MCG tablet    Sig: Take 1 tablet (112 mcg total) by mouth daily.    Dispense:  90 tablet    Refill:  1  . pravastatin (PRAVACHOL) 10 MG tablet    Sig: Take 1 tablet (10 mg total) by mouth daily.    Dispense:  90 tablet    Refill:  1    Problem List Items Addressed This Visit    None    Visit Diagnoses    Diabetes mellitus type II, controlled    -  Primary    Relevant Medications    aspirin EC tablet    metFORMIN (GLUCOPHAGE) tablet    pravastatin (PRAVACHOL) tablet    Other Relevant Orders    Basic metabolic panel    Hemoglobin A1c    Microalbumin / creatinine urine ratio    POCT urinalysis dipstick    Essential hypertension        Relevant Medications    aspirin EC tablet    amLODIpine (NORVASC) tablet    pravastatin (PRAVACHOL) tablet    Other Relevant Orders    Microalbumin / creatinine urine ratio    POCT urinalysis dipstick    Hyperlipidemia LDL goal <70        Relevant Medications    aspirin EC tablet    amLODIpine (NORVASC) tablet    pravastatin (PRAVACHOL) tablet    Other Relevant Orders    Hepatic function panel    Lipid panel    Microalbumin / creatinine urine ratio    POCT urinalysis dipstick    Left-sided low back pain with left-sided sciatica        Relevant Medications    aspirin EC tablet    cyclobenzaprine (FLEXERIL) tablet    HYDROcodone-acetaminophen (NORCO/VICODIN) 5-325 MG per tablet    Other Relevant Orders    MR Lumbar Spine W Wo Contrast    Hypothyroidism, unspecified hypothyroidism type        Relevant Medications    levothyroxine (SYNTHROID, LEVOTHROID) tablet       Follow-up: Return in about 6 months (around 01/18/2015), or if symptoms worsen or fail to improve, for f/u and labs.  Garnet Koyanagi, DO

## 2014-07-23 ENCOUNTER — Ambulatory Visit (HOSPITAL_BASED_OUTPATIENT_CLINIC_OR_DEPARTMENT_OTHER)
Admission: RE | Admit: 2014-07-23 | Discharge: 2014-07-23 | Disposition: A | Discharge status: Home / Self Care | Payer: Medicare Other | Source: Ambulatory Visit | Attending: Family Medicine | Admitting: Family Medicine

## 2014-07-23 DIAGNOSIS — M5126 Other intervertebral disc displacement, lumbar region: Secondary | ICD-10-CM | POA: Insufficient documentation

## 2014-07-23 DIAGNOSIS — M5442 Lumbago with sciatica, left side: Secondary | ICD-10-CM

## 2014-07-23 MED ORDER — GADOBENATE DIMEGLUMINE 529 MG/ML IV SOLN: 13.0000 mL | Freq: Once | INTRAVENOUS | Status: AC | PRN

## 2014-07-25 ENCOUNTER — Other Ambulatory Visit: Payer: Self-pay | Admitting: Family Medicine

## 2014-07-25 DIAGNOSIS — M5126 Other intervertebral disc displacement, lumbar region: Secondary | ICD-10-CM

## 2014-07-25 DIAGNOSIS — M5136 Other intervertebral disc degeneration, lumbar region: Secondary | ICD-10-CM

## 2014-07-27 DIAGNOSIS — J301 Allergic rhinitis due to pollen: Secondary | ICD-10-CM | POA: Diagnosis not present

## 2014-07-27 DIAGNOSIS — J3089 Other allergic rhinitis: Secondary | ICD-10-CM | POA: Diagnosis not present

## 2014-07-28 ENCOUNTER — Other Ambulatory Visit: Payer: Self-pay

## 2014-07-28 DIAGNOSIS — E119 Type 2 diabetes mellitus without complications: Secondary | ICD-10-CM

## 2014-07-28 DIAGNOSIS — I10 Essential (primary) hypertension: Secondary | ICD-10-CM

## 2014-07-28 DIAGNOSIS — E039 Hypothyroidism, unspecified: Secondary | ICD-10-CM

## 2014-07-28 DIAGNOSIS — E785 Hyperlipidemia, unspecified: Secondary | ICD-10-CM

## 2014-07-28 MED ORDER — PRAVASTATIN SODIUM 10 MG PO TABS: 10.0000 mg | ORAL_TABLET | Freq: Every day | ORAL | Status: DC

## 2014-07-28 MED ORDER — METFORMIN HCL 1000 MG PO TABS: 1000.0000 mg | ORAL_TABLET | Freq: Two times a day (BID) | ORAL | Status: DC

## 2014-07-28 MED ORDER — LEVOTHYROXINE SODIUM 112 MCG PO TABS: 112.0000 ug | ORAL_TABLET | Freq: Every day | ORAL | Status: DC

## 2014-07-28 MED ORDER — AMLODIPINE BESYLATE 5 MG PO TABS: 5.0000 mg | ORAL_TABLET | Freq: Every day | ORAL | Status: DC

## 2014-07-29 DIAGNOSIS — Z6826 Body mass index (BMI) 26.0-26.9, adult: Secondary | ICD-10-CM | POA: Diagnosis not present

## 2014-07-29 DIAGNOSIS — R030 Elevated blood-pressure reading, without diagnosis of hypertension: Secondary | ICD-10-CM | POA: Diagnosis not present

## 2014-07-29 DIAGNOSIS — M546 Pain in thoracic spine: Secondary | ICD-10-CM | POA: Diagnosis not present

## 2014-07-29 DIAGNOSIS — M5136 Other intervertebral disc degeneration, lumbar region: Secondary | ICD-10-CM | POA: Diagnosis not present

## 2014-07-29 DIAGNOSIS — M549 Dorsalgia, unspecified: Secondary | ICD-10-CM | POA: Diagnosis not present

## 2014-07-29 DIAGNOSIS — M5416 Radiculopathy, lumbar region: Secondary | ICD-10-CM | POA: Diagnosis not present

## 2014-07-29 DIAGNOSIS — M5126 Other intervertebral disc displacement, lumbar region: Secondary | ICD-10-CM | POA: Diagnosis not present

## 2014-07-29 DIAGNOSIS — M4726 Other spondylosis with radiculopathy, lumbar region: Secondary | ICD-10-CM | POA: Diagnosis not present

## 2014-08-01 DIAGNOSIS — M5116 Intervertebral disc disorders with radiculopathy, lumbar region: Secondary | ICD-10-CM | POA: Diagnosis not present

## 2014-08-01 DIAGNOSIS — M4726 Other spondylosis with radiculopathy, lumbar region: Secondary | ICD-10-CM | POA: Diagnosis not present

## 2014-08-01 DIAGNOSIS — M5126 Other intervertebral disc displacement, lumbar region: Secondary | ICD-10-CM | POA: Diagnosis not present

## 2014-08-01 DIAGNOSIS — M5136 Other intervertebral disc degeneration, lumbar region: Secondary | ICD-10-CM | POA: Diagnosis not present

## 2014-08-22 DIAGNOSIS — J3089 Other allergic rhinitis: Secondary | ICD-10-CM | POA: Diagnosis not present

## 2014-08-22 DIAGNOSIS — J301 Allergic rhinitis due to pollen: Secondary | ICD-10-CM | POA: Diagnosis not present

## 2014-08-22 DIAGNOSIS — J3081 Allergic rhinitis due to animal (cat) (dog) hair and dander: Secondary | ICD-10-CM | POA: Diagnosis not present

## 2014-09-28 DIAGNOSIS — J301 Allergic rhinitis due to pollen: Secondary | ICD-10-CM | POA: Diagnosis not present

## 2014-09-28 DIAGNOSIS — J3089 Other allergic rhinitis: Secondary | ICD-10-CM | POA: Diagnosis not present

## 2014-09-28 DIAGNOSIS — J3081 Allergic rhinitis due to animal (cat) (dog) hair and dander: Secondary | ICD-10-CM | POA: Diagnosis not present

## 2014-10-27 DIAGNOSIS — J3081 Allergic rhinitis due to animal (cat) (dog) hair and dander: Secondary | ICD-10-CM | POA: Diagnosis not present

## 2014-10-27 DIAGNOSIS — J301 Allergic rhinitis due to pollen: Secondary | ICD-10-CM | POA: Diagnosis not present

## 2014-10-27 DIAGNOSIS — J3089 Other allergic rhinitis: Secondary | ICD-10-CM | POA: Diagnosis not present

## 2014-11-08 DIAGNOSIS — J4520 Mild intermittent asthma, uncomplicated: Secondary | ICD-10-CM | POA: Diagnosis not present

## 2014-11-08 DIAGNOSIS — J301 Allergic rhinitis due to pollen: Secondary | ICD-10-CM | POA: Diagnosis not present

## 2014-11-08 DIAGNOSIS — J3081 Allergic rhinitis due to animal (cat) (dog) hair and dander: Secondary | ICD-10-CM | POA: Diagnosis not present

## 2014-11-08 DIAGNOSIS — J3089 Other allergic rhinitis: Secondary | ICD-10-CM | POA: Diagnosis not present

## 2014-11-11 DIAGNOSIS — I10 Essential (primary) hypertension: Secondary | ICD-10-CM | POA: Diagnosis not present

## 2014-11-11 DIAGNOSIS — Z9889 Other specified postprocedural states: Secondary | ICD-10-CM | POA: Diagnosis not present

## 2014-11-11 DIAGNOSIS — Z6825 Body mass index (BMI) 25.0-25.9, adult: Secondary | ICD-10-CM | POA: Diagnosis not present

## 2014-11-11 DIAGNOSIS — M5136 Other intervertebral disc degeneration, lumbar region: Secondary | ICD-10-CM | POA: Diagnosis not present

## 2014-11-11 DIAGNOSIS — M4726 Other spondylosis with radiculopathy, lumbar region: Secondary | ICD-10-CM | POA: Diagnosis not present

## 2014-11-23 DIAGNOSIS — J3089 Other allergic rhinitis: Secondary | ICD-10-CM | POA: Diagnosis not present

## 2014-11-23 DIAGNOSIS — J301 Allergic rhinitis due to pollen: Secondary | ICD-10-CM | POA: Diagnosis not present

## 2014-11-23 DIAGNOSIS — J3081 Allergic rhinitis due to animal (cat) (dog) hair and dander: Secondary | ICD-10-CM | POA: Diagnosis not present

## 2014-11-29 DIAGNOSIS — J3089 Other allergic rhinitis: Secondary | ICD-10-CM | POA: Diagnosis not present

## 2014-11-29 DIAGNOSIS — J301 Allergic rhinitis due to pollen: Secondary | ICD-10-CM | POA: Diagnosis not present

## 2015-01-02 DIAGNOSIS — J3081 Allergic rhinitis due to animal (cat) (dog) hair and dander: Secondary | ICD-10-CM | POA: Diagnosis not present

## 2015-01-02 DIAGNOSIS — J301 Allergic rhinitis due to pollen: Secondary | ICD-10-CM | POA: Diagnosis not present

## 2015-01-02 DIAGNOSIS — J3089 Other allergic rhinitis: Secondary | ICD-10-CM | POA: Diagnosis not present

## 2015-01-04 DIAGNOSIS — H5203 Hypermetropia, bilateral: Secondary | ICD-10-CM | POA: Diagnosis not present

## 2015-01-04 DIAGNOSIS — E119 Type 2 diabetes mellitus without complications: Secondary | ICD-10-CM | POA: Diagnosis not present

## 2015-01-04 DIAGNOSIS — D3130 Benign neoplasm of unspecified choroid: Secondary | ICD-10-CM | POA: Diagnosis not present

## 2015-01-04 LAB — HM DIABETES EYE EXAM

## 2015-01-06 DIAGNOSIS — J3081 Allergic rhinitis due to animal (cat) (dog) hair and dander: Secondary | ICD-10-CM | POA: Diagnosis not present

## 2015-01-06 DIAGNOSIS — J301 Allergic rhinitis due to pollen: Secondary | ICD-10-CM | POA: Diagnosis not present

## 2015-01-06 DIAGNOSIS — J3089 Other allergic rhinitis: Secondary | ICD-10-CM | POA: Diagnosis not present

## 2015-01-09 DIAGNOSIS — J301 Allergic rhinitis due to pollen: Secondary | ICD-10-CM | POA: Diagnosis not present

## 2015-01-09 DIAGNOSIS — J3089 Other allergic rhinitis: Secondary | ICD-10-CM | POA: Diagnosis not present

## 2015-01-11 DIAGNOSIS — J3089 Other allergic rhinitis: Secondary | ICD-10-CM | POA: Diagnosis not present

## 2015-01-11 DIAGNOSIS — J3081 Allergic rhinitis due to animal (cat) (dog) hair and dander: Secondary | ICD-10-CM | POA: Diagnosis not present

## 2015-01-11 DIAGNOSIS — J301 Allergic rhinitis due to pollen: Secondary | ICD-10-CM | POA: Diagnosis not present

## 2015-01-17 DIAGNOSIS — J3089 Other allergic rhinitis: Secondary | ICD-10-CM | POA: Diagnosis not present

## 2015-01-17 DIAGNOSIS — J3081 Allergic rhinitis due to animal (cat) (dog) hair and dander: Secondary | ICD-10-CM | POA: Diagnosis not present

## 2015-01-17 DIAGNOSIS — J301 Allergic rhinitis due to pollen: Secondary | ICD-10-CM | POA: Diagnosis not present

## 2015-01-20 DIAGNOSIS — J301 Allergic rhinitis due to pollen: Secondary | ICD-10-CM | POA: Diagnosis not present

## 2015-01-20 DIAGNOSIS — J3089 Other allergic rhinitis: Secondary | ICD-10-CM | POA: Diagnosis not present

## 2015-01-20 DIAGNOSIS — J3081 Allergic rhinitis due to animal (cat) (dog) hair and dander: Secondary | ICD-10-CM | POA: Diagnosis not present

## 2015-02-02 ENCOUNTER — Other Ambulatory Visit: Payer: Self-pay | Admitting: Family Medicine

## 2015-02-06 DIAGNOSIS — Z23 Encounter for immunization: Secondary | ICD-10-CM | POA: Diagnosis not present

## 2015-02-07 ENCOUNTER — Encounter: Payer: Self-pay | Admitting: Family Medicine

## 2015-02-09 ENCOUNTER — Other Ambulatory Visit: Payer: Self-pay

## 2015-02-09 MED ORDER — FREESTYLE LANCETS MISC: Status: DC

## 2015-02-17 DIAGNOSIS — J301 Allergic rhinitis due to pollen: Secondary | ICD-10-CM | POA: Diagnosis not present

## 2015-02-17 DIAGNOSIS — J3081 Allergic rhinitis due to animal (cat) (dog) hair and dander: Secondary | ICD-10-CM | POA: Diagnosis not present

## 2015-02-17 DIAGNOSIS — Z1231 Encounter for screening mammogram for malignant neoplasm of breast: Secondary | ICD-10-CM | POA: Diagnosis not present

## 2015-02-17 DIAGNOSIS — J3089 Other allergic rhinitis: Secondary | ICD-10-CM | POA: Diagnosis not present

## 2015-02-17 LAB — HM MAMMOGRAPHY: HM MAMMO: NEGATIVE

## 2015-02-22 ENCOUNTER — Encounter: Payer: Self-pay | Admitting: Family Medicine

## 2015-03-02 ENCOUNTER — Encounter: Payer: Self-pay | Admitting: Family Medicine

## 2015-03-02 ENCOUNTER — Ambulatory Visit (INDEPENDENT_AMBULATORY_CARE_PROVIDER_SITE_OTHER): Payer: Medicare Other | Admitting: Family Medicine

## 2015-03-02 VITALS — BP 132/70 | HR 81 | Temp 98.1°F | Wt 144.4 lb

## 2015-03-02 DIAGNOSIS — E785 Hyperlipidemia, unspecified: Secondary | ICD-10-CM | POA: Diagnosis not present

## 2015-03-02 DIAGNOSIS — E039 Hypothyroidism, unspecified: Secondary | ICD-10-CM

## 2015-03-02 DIAGNOSIS — I10 Essential (primary) hypertension: Secondary | ICD-10-CM

## 2015-03-02 DIAGNOSIS — Z1159 Encounter for screening for other viral diseases: Secondary | ICD-10-CM | POA: Diagnosis not present

## 2015-03-02 DIAGNOSIS — E1151 Type 2 diabetes mellitus with diabetic peripheral angiopathy without gangrene: Secondary | ICD-10-CM | POA: Diagnosis not present

## 2015-03-02 DIAGNOSIS — K219 Gastro-esophageal reflux disease without esophagitis: Secondary | ICD-10-CM

## 2015-03-02 LAB — COMPREHENSIVE METABOLIC PANEL
ALBUMIN: 4.7 g/dL (ref 3.5–5.2)
ALT: 19 U/L (ref 0–35)
AST: 15 U/L (ref 0–37)
Alkaline Phosphatase: 40 U/L (ref 39–117)
BILIRUBIN TOTAL: 0.4 mg/dL (ref 0.2–1.2)
BUN: 16 mg/dL (ref 6–23)
CALCIUM: 10.1 mg/dL (ref 8.4–10.5)
CO2: 29 mEq/L (ref 19–32)
CREATININE: 0.72 mg/dL (ref 0.40–1.20)
Chloride: 101 mEq/L (ref 96–112)
GFR: 84.77 mL/min (ref >60.00)
Glucose, Bld: 133 mg/dL — ABNORMAL HIGH (ref 70–99)
Potassium: 4.6 mEq/L (ref 3.5–5.1)
SODIUM: 142 meq/L (ref 135–145)
Total Protein: 7.8 g/dL (ref 6.0–8.3)

## 2015-03-02 LAB — LIPID PANEL
CHOLESTEROL: 125 mg/dL (ref 0–200)
HDL: 47 mg/dL (ref >39.00)
LDL CALC: 63 mg/dL (ref 0–99)
NONHDL: 77.99
Total CHOL/HDL Ratio: 3
Triglycerides: 73 mg/dL (ref 0.0–149.0)
VLDL: 14.6 mg/dL (ref 0.0–40.0)

## 2015-03-02 LAB — TSH: TSH: 0.29 u[IU]/mL — ABNORMAL LOW (ref 0.35–4.50)

## 2015-03-02 LAB — HEMOGLOBIN A1C: HEMOGLOBIN A1C: 6.8 % — AB (ref 4.6–6.5)

## 2015-03-02 MED ORDER — LEVOTHYROXINE SODIUM 112 MCG PO TABS: 112.0000 ug | ORAL_TABLET | Freq: Every day | ORAL | Status: DC

## 2015-03-02 MED ORDER — PRAVASTATIN SODIUM 10 MG PO TABS
10.0000 mg | ORAL_TABLET | Freq: Every day | ORAL | Status: DC
Start: 2015-03-02 — End: 2015-10-31

## 2015-03-02 MED ORDER — METFORMIN HCL 1000 MG PO TABS: 1000.0000 mg | ORAL_TABLET | Freq: Two times a day (BID) | ORAL | Status: DC

## 2015-03-02 MED ORDER — AMLODIPINE BESYLATE 5 MG PO TABS: 5.0000 mg | ORAL_TABLET | Freq: Every day | ORAL | Status: DC

## 2015-03-02 MED ORDER — OMEPRAZOLE 40 MG PO CPDR: 40.0000 mg | DELAYED_RELEASE_CAPSULE | Freq: Every day | ORAL | Status: DC

## 2015-03-02 NOTE — Patient Instructions (Signed)

## 2015-03-02 NOTE — Progress Notes (Signed)
Patient ID: Cheryl Cisneros, female    DOB: 1946/03/10  Age: 69 y.o. MRN: [id number]    Subjective:  Subjective HPI Cheryl Cisneros presents for f/u dm, cholesterol and bp.  HPI HYPERTENSION  Blood pressure range-good  Chest pain- no      Dyspnea- no Lightheadedness- no   Edema- no Other side effects - no   Medication compliance: good Low salt diet- yes  DIABETES  Blood Sugar ranges-120-140  Polyuria- no New Visual problems- no Hypoglycemic symptoms- no Other side effects-no Medication compliance - good Last eye exam- 01/04/2015 Foot exam- today  HYPERLIPIDEMIA  Medication compliance- good RUQ pain- no  Muscle aches- no Other side effects-no      Review of Systems  Constitutional: Negative for diaphoresis, appetite change, fatigue and unexpected weight change.  Eyes: Negative for pain, redness and visual disturbance.  Respiratory: Negative for cough, chest tightness, shortness of breath and wheezing.   Cardiovascular: Negative for chest pain, palpitations and leg swelling.  Endocrine: Negative for cold intolerance, heat intolerance, polydipsia, polyphagia and polyuria.  Genitourinary: Negative for dysuria, frequency and difficulty urinating.  Neurological: Negative for dizziness, light-headedness, numbness and headaches.    History Past Medical History  Diagnosis Date  . Hypertension   . Allergy   . GERD (gastroesophageal reflux disease)   . Thyroid disease     Hypothyroidism  . Asthma   . Macular degeneration 12/25/2011  . Cataract     She has past surgical history that includes Tonsillectomy; Knee arthroscopy; Lumbar laminectomy (1999); Rotator cuff repair (02/27/2005); and Abdominal hysterectomy (1986).   Her family history includes Cancer in her father; Coronary artery disease in an other family member; Dementia in her sister; Diabetes in her brother, father, mother, sister, and sister; Hyperlipidemia in her brother, sister, and sister; Hypertension  in her brother, father, mother, sister, and sister; Kidney disease in her mother; Lung cancer in an other family member.She reports that she has never smoked. She has never used smokeless tobacco. She reports that she does not drink alcohol or use illicit drugs.  Current Outpatient Prescriptions on File Prior to Visit  Medication Sig Dispense Refill  . aspirin EC 81 MG tablet Take 1 tablet (81 mg total) by mouth daily.    . Biotin 1000 MCG tablet Take 1,000 mcg by mouth daily.     . Calcium-Vitamin D 600-200 MG-UNIT per tablet Take 1 tablet by mouth 3 (three) times daily with meals.      . cetirizine (ZYRTEC) 10 MG tablet Take 1 tablet (10 mg total) by mouth daily. 90 tablet 3  . Cholecalciferol (VITAMIN D3) 1000 UNITS CAPS Take 1 capsule by mouth daily.      . cyclobenzaprine (FLEXERIL) 10 MG tablet Take 1 tablet (10 mg total) by mouth 3 (three) times daily as needed for muscle spasms. 30 tablet 0  . EPIPEN 2-PAK 0.3 MG/0.3ML DEVI     . Garlic Oil (SUPER GARLIC) 8502 MG CAPS Take 1 capsule by mouth daily.     Marland Kitchen glucose blood (ONE TOUCH ULTRA TEST) test strip Check blood sugar bid E11.9 100 each 11  . L-Lysine 500 MG TABS Take 1 tablet by mouth daily.      . Lancets (FREESTYLE) lancets TEST BLOOD SUGAR TWICE A DAY. Dx: E11.9 200 each 1  . Magnesium 250 MG TABS Take 1 tablet by mouth 2 (two) times daily.      . Omega-3 Fatty Acids (OMEGA 3 PO) Take 1 tablet by mouth daily.      Marland Kitchen  OVER THE COUNTER MEDICATION Maxi-vision 2 drops per day    . SIMPLY SALINE NA Place 1 spray into the nose daily.      . Turmeric 500 MG CAPS Take 1 capsule by mouth 2 (two) times daily.    . vitamin C (ASCORBIC ACID) 500 MG tablet Take 1,000 mg by mouth daily.     No current facility-administered medications on file prior to visit.     Objective:  Objective Physical Exam  Constitutional: She is oriented to person, place, and time. She appears well-developed and well-nourished.  HENT:  Head: Normocephalic and  atraumatic.  Eyes: Conjunctivae and EOM are normal.  Neck: Normal range of motion. Neck supple. No JVD present. Carotid bruit is not present. No thyromegaly present.  Cardiovascular: Normal rate, regular rhythm and normal heart sounds.   No murmur heard. Pulmonary/Chest: Effort normal and breath sounds normal. No respiratory distress. She has no wheezes. She has no rales. She exhibits no tenderness.  Musculoskeletal: She exhibits no edema.  Neurological: She is alert and oriented to person, place, and time.  Psychiatric: She has a normal mood and affect.  Nursing note and vitals reviewed. Sensory exam of the foot is normal, tested with the monofilament. Good pulses, no lesions or ulcers, good peripheral pulses.  BP 132/70 mmHg  Pulse 81  Temp(Src) 98.1 F (36.7 C) (Oral)  Wt 144 lb 6.4 oz (65.499 kg)  SpO2 98% Wt Readings from Last 3 Encounters:  03/02/15 144 lb 6.4 oz (65.499 kg)  07/23/14 142 lb (64.411 kg)  07/18/14 142 lb 9.6 oz (64.683 kg)     Lab Results  Component Value Date   WBC 7.6 12/29/2013   HGB 13.5 12/29/2013   HCT 40.5 12/29/2013   PLT 227.0 12/29/2013   GLUCOSE 133* 03/02/2015   CHOL 125 03/02/2015   TRIG 73.0 03/02/2015   HDL 47.00 03/02/2015   LDLCALC 63 03/02/2015   ALT 19 03/02/2015   AST 15 03/02/2015   NA 142 03/02/2015   K 4.6 03/02/2015   CL 101 03/02/2015   CREATININE 0.72 03/02/2015   BUN 16 03/02/2015   CO2 29 03/02/2015   TSH 0.29* 03/02/2015   HGBA1C 6.8* 03/02/2015   MICROALBUR 0.8 07/18/2014    Mr Lumbar Spine W Wo Contrast  07/23/2014  CLINICAL DATA:  Left hip pain since exercising 5 months ago. Remote back surgery. Initial encounter. EXAM: MRI LUMBAR SPINE WITHOUT AND WITH CONTRAST TECHNIQUE: Multiplanar and multiecho pulse sequences of the lumbar spine were obtained without and with intravenous contrast. CONTRAST:  13 ml MultiHance. COMPARISON:  None. FINDINGS: Five lumbar type vertebral bodies are assumed. There is a mild convex  left scoliosis. There is advanced disc space loss and endplate degeneration asymmetric to the left at L5-S1. There is a prominent hemangioma within the left aspect of the L4 vertebral body. No evidence of acute fracture or pars defect. The conus medullaris extends to the T12-L1 level and appears normal. No abnormal intradural enhancement or paraspinal abnormality demonstrated.A cyst projecting from the upper pole of the right kidney is partially imaged on the sagittal images. No significant disc space findings from T11-12 through L2-3. L3-4: Mild disc bulging with an extraforaminal disc protrusion on the left causing possible extraforaminal left L3 nerve root encroachment. There is mild facet and ligamentous hypertrophy. The lateral recesses and foramina are sufficiently patent. L4-5: Disc degeneration with loss of height and a broad-based disc protrusion on the left involving the subarticular zone and foramen. This does  not appear to causes left L4 nerve root encroachment, although mildly displaces the left L5 nerve root posteriorly in the lateral recess. There is mild facet and ligamentous hypertrophy contributing to mild triangulation of the thecal sac. No right-sided nerve root encroachment identified. L5-S1: Left laminectomy defect noted with chronic disc degeneration and posterior osteophytes. Mild facet hypertrophy, greater on the left. There is mild fibrosis surrounding the left S1 nerve root. There is mild inferior foraminal narrowing on the left without definite L5 nerve root encroachment. IMPRESSION: 1. Small extraforaminal disc protrusion on the left at L3-4 may encroach on the extraforaminal portion of the left L3 nerve root. 2. Broad-based disc protrusion on the left involving the subarticular zone and foramen at L4-5 causing probable left L5 nerve root encroachment in the lateral recess. This does not cause significant foraminal narrowing or definite L4 nerve root encroachment. 3. Postsurgical changes  on the left at L5-S1 with epidural fibrosis surrounding the left S1 nerve root sleeve and mild left foraminal narrowing, but no definite nerve root encroachment. Electronically Signed   By: Richardean Sale M.D.   On: 07/23/2014 15:32     Assessment & Plan:  Plan I have discontinued Ms. Braddy's AMBULATORY NON FORMULARY MEDICATION and HYDROcodone-acetaminophen. I have also changed her pravastatin, metFORMIN, and levothyroxine. Additionally, I am having her maintain her Calcium-Vitamin D, L-Lysine, Magnesium, Omega-3 Fatty Acids (OMEGA 3 PO), Garlic Oil, Vitamin D3, SIMPLY SALINE NA, EPIPEN 2-PAK, Biotin, OVER THE COUNTER MEDICATION, Turmeric, vitamin C, cetirizine, glucose blood, aspirin EC, cyclobenzaprine, freestyle, omeprazole, and amLODipine.  Meds ordered this encounter  Medications  . pravastatin (PRAVACHOL) 10 MG tablet    Sig: Take 1 tablet (10 mg total) by mouth daily.    Dispense:  90 tablet    Refill:  1  . omeprazole (PRILOSEC) 40 MG capsule    Sig: Take 1 capsule (40 mg total) by mouth daily.    Dispense:  90 capsule    Refill:  3  . metFORMIN (GLUCOPHAGE) 1000 MG tablet    Sig: Take 1 tablet (1,000 mg total) by mouth 2 (two) times daily with a meal.    Dispense:  180 tablet    Refill:  1  . levothyroxine (SYNTHROID, LEVOTHROID) 112 MCG tablet    Sig: Take 1 tablet (112 mcg total) by mouth daily.    Dispense:  90 tablet    Refill:  1  . amLODipine (NORVASC) 5 MG tablet    Sig: Take 1 tablet (5 mg total) by mouth daily.    Dispense:  90 tablet    Refill:  1    Problem List Items Addressed This Visit    GERD   Relevant Medications   omeprazole (PRILOSEC) 40 MG capsule    Other Visit Diagnoses    Essential hypertension    -  Primary    Relevant Medications    pravastatin (PRAVACHOL) 10 MG tablet    amLODipine (NORVASC) 5 MG tablet    Other Relevant Orders    Comp Met (CMET) (Completed)    Hemoglobin A1c (Completed)    Lipid panel (Completed)    Hyperlipidemia  LDL goal <70        Relevant Medications    pravastatin (PRAVACHOL) 10 MG tablet    amLODipine (NORVASC) 5 MG tablet    Other Relevant Orders    Comp Met (CMET) (Completed)    Hemoglobin A1c (Completed)    Lipid panel (Completed)    DM (diabetes mellitus) type II controlled peripheral  vascular disorder (HCC)        Relevant Medications    pravastatin (PRAVACHOL) 10 MG tablet    metFORMIN (GLUCOPHAGE) 1000 MG tablet    amLODipine (NORVASC) 5 MG tablet    Other Relevant Orders    Comp Met (CMET) (Completed)    Hemoglobin A1c (Completed)    Lipid panel (Completed)    Hypothyroidism, unspecified hypothyroidism type        Relevant Medications    levothyroxine (SYNTHROID, LEVOTHROID) 112 MCG tablet    Other Relevant Orders    TSH (Completed)    Need for hepatitis C screening test        Relevant Orders    Hepatitis C antibody       Follow-up: Return in about 6 months (around 08/30/2015), or if symptoms worsen or fail to improve, for hypertension, hyperlipidemia, diabetes II, annual exam, fasting.  Garnet Koyanagi, DO

## 2015-03-02 NOTE — Progress Notes (Signed)
Pre visit review using our clinic review tool, if applicable. No additional management support is needed unless otherwise documented below in the visit note. 

## 2015-03-03 LAB — HEPATITIS C ANTIBODY: HCV AB: NEGATIVE

## 2015-03-10 DIAGNOSIS — E039 Hypothyroidism, unspecified: Secondary | ICD-10-CM

## 2015-03-10 DIAGNOSIS — E079 Disorder of thyroid, unspecified: Secondary | ICD-10-CM

## 2015-03-10 MED ORDER — LEVOTHYROXINE SODIUM 100 MCG PO TABS: 100.0000 ug | ORAL_TABLET | Freq: Every day | ORAL | Status: DC

## 2015-03-20 DIAGNOSIS — J3089 Other allergic rhinitis: Secondary | ICD-10-CM | POA: Diagnosis not present

## 2015-03-20 DIAGNOSIS — J3081 Allergic rhinitis due to animal (cat) (dog) hair and dander: Secondary | ICD-10-CM | POA: Diagnosis not present

## 2015-03-20 DIAGNOSIS — J301 Allergic rhinitis due to pollen: Secondary | ICD-10-CM | POA: Diagnosis not present

## 2015-03-22 ENCOUNTER — Other Ambulatory Visit: Payer: Self-pay | Admitting: Family Medicine

## 2015-03-22 NOTE — Telephone Encounter (Signed)
Medication filled to pharmacy as requested.   

## 2015-04-10 DIAGNOSIS — M85852 Other specified disorders of bone density and structure, left thigh: Secondary | ICD-10-CM | POA: Diagnosis not present

## 2015-04-10 DIAGNOSIS — J301 Allergic rhinitis due to pollen: Secondary | ICD-10-CM | POA: Diagnosis not present

## 2015-04-10 DIAGNOSIS — J3089 Other allergic rhinitis: Secondary | ICD-10-CM | POA: Diagnosis not present

## 2015-04-10 DIAGNOSIS — J3081 Allergic rhinitis due to animal (cat) (dog) hair and dander: Secondary | ICD-10-CM | POA: Diagnosis not present

## 2015-04-10 LAB — HM DEXA SCAN

## 2015-05-03 ENCOUNTER — Encounter: Payer: Self-pay | Admitting: Family Medicine

## 2015-05-03 ENCOUNTER — Telehealth: Payer: Self-pay

## 2015-05-03 MED ORDER — ALENDRONATE SODIUM 70 MG PO TABS: 70.0000 mg | ORAL_TABLET | ORAL | Status: DC

## 2015-05-03 NOTE — Telephone Encounter (Signed)
Bone Denisty from 04/11/15 shows Osteopenia. Per Dr.Lowne consider Vitamin D and Calcium. Consider Fosamax 70 mg weekly Patient has agreed to taking the Fosamax and the Rx has been faxed to [facility].      KP

## 2015-05-10 ENCOUNTER — Other Ambulatory Visit (INDEPENDENT_AMBULATORY_CARE_PROVIDER_SITE_OTHER): Payer: Medicare Other

## 2015-05-10 DIAGNOSIS — E079 Disorder of thyroid, unspecified: Secondary | ICD-10-CM

## 2015-05-10 LAB — TSH: TSH: 0.85 u[IU]/mL (ref 0.35–4.50)

## 2015-05-15 DIAGNOSIS — J301 Allergic rhinitis due to pollen: Secondary | ICD-10-CM | POA: Diagnosis not present

## 2015-05-15 DIAGNOSIS — J3089 Other allergic rhinitis: Secondary | ICD-10-CM | POA: Diagnosis not present

## 2015-05-15 DIAGNOSIS — J3081 Allergic rhinitis due to animal (cat) (dog) hair and dander: Secondary | ICD-10-CM | POA: Diagnosis not present

## 2015-06-02 ENCOUNTER — Telehealth: Payer: Self-pay | Admitting: *Deleted

## 2015-06-02 ENCOUNTER — Encounter: Payer: Self-pay | Admitting: *Deleted

## 2015-06-02 NOTE — Telephone Encounter (Signed)
Pre-Visit Call completed with patient and chart updated.   Pre-Visit Info documented in Specialty Comments under [employer].    

## 2015-06-02 NOTE — Telephone Encounter (Signed)
Pt returned call - call back [phone]

## 2015-06-02 NOTE — Telephone Encounter (Signed)
Unable to reach patient at time of pre-visit call. Left message for patient to return call when available.  

## 2015-06-05 ENCOUNTER — Ambulatory Visit (INDEPENDENT_AMBULATORY_CARE_PROVIDER_SITE_OTHER): Payer: Medicare Other | Admitting: Family Medicine

## 2015-06-05 ENCOUNTER — Encounter: Payer: Self-pay | Admitting: Family Medicine

## 2015-06-05 VITALS — BP 130/68 | HR 92 | Temp 98.0°F | Ht 61.5 in | Wt 145.0 lb

## 2015-06-05 DIAGNOSIS — E1151 Type 2 diabetes mellitus with diabetic peripheral angiopathy without gangrene: Secondary | ICD-10-CM

## 2015-06-05 DIAGNOSIS — Z0000 Encounter for general adult medical examination without abnormal findings: Secondary | ICD-10-CM | POA: Diagnosis not present

## 2015-06-05 DIAGNOSIS — E039 Hypothyroidism, unspecified: Secondary | ICD-10-CM | POA: Diagnosis not present

## 2015-06-05 DIAGNOSIS — I10 Essential (primary) hypertension: Secondary | ICD-10-CM | POA: Diagnosis not present

## 2015-06-05 DIAGNOSIS — E785 Hyperlipidemia, unspecified: Secondary | ICD-10-CM | POA: Diagnosis not present

## 2015-06-05 LAB — COMPREHENSIVE METABOLIC PANEL
ALT: 22 U/L (ref 0–35)
AST: 19 U/L (ref 0–37)
Albumin: 4.8 g/dL (ref 3.5–5.2)
Alkaline Phosphatase: 36 U/L — ABNORMAL LOW (ref 39–117)
BUN: 18 mg/dL (ref 6–23)
CO2: 27 mEq/L (ref 19–32)
Calcium: 9.6 mg/dL (ref 8.4–10.5)
Chloride: 101 mEq/L (ref 96–112)
Creatinine, Ser: 0.75 mg/dL (ref 0.40–1.20)
GFR: 80.81 mL/min (ref >60.00)
Glucose, Bld: 138 mg/dL — ABNORMAL HIGH (ref 70–99)
Potassium: 3.8 mEq/L (ref 3.5–5.1)
Sodium: 139 mEq/L (ref 135–145)
Total Bilirubin: 0.5 mg/dL (ref 0.2–1.2)
Total Protein: 8.1 g/dL (ref 6.0–8.3)

## 2015-06-05 LAB — HEMOGLOBIN A1C: Hgb A1c MFr Bld: 7 % — ABNORMAL HIGH (ref 4.6–6.5)

## 2015-06-05 LAB — CBC WITH DIFFERENTIAL/PLATELET
BASOS PCT: 0.5 % (ref 0.0–3.0)
Basophils Absolute: 0 10*3/uL (ref 0.0–0.1)
EOS ABS: 0.1 10*3/uL (ref 0.0–0.7)
EOS PCT: 1.1 % (ref 0.0–5.0)
HCT: 40.5 % (ref 36.0–46.0)
HEMOGLOBIN: 13.4 g/dL (ref 12.0–15.0)
LYMPHS ABS: 3.2 10*3/uL (ref 0.7–4.0)
Lymphocytes Relative: 40.3 % (ref 12.0–46.0)
MCHC: 33 g/dL (ref 30.0–36.0)
MCV: 92.7 fl (ref 78.0–100.0)
MONO ABS: 0.4 10*3/uL (ref 0.1–1.0)
Monocytes Relative: 4.9 % (ref 3.0–12.0)
NEUTROS ABS: 4.2 10*3/uL (ref 1.4–7.7)
Neutrophils Relative %: 53.2 % (ref 43.0–77.0)
PLATELETS: 240 10*3/uL (ref 150.0–400.0)
RBC: 4.38 Mil/uL (ref 3.87–5.11)
RDW: 14.5 % (ref 11.5–15.5)
WBC: 7.9 10*3/uL (ref 4.0–10.5)

## 2015-06-05 LAB — LIPID PANEL
CHOLESTEROL: 128 mg/dL (ref 0–200)
HDL: 48.6 mg/dL (ref >39.00)
LDL CALC: 62 mg/dL (ref 0–99)
NonHDL: 79.77
TRIGLYCERIDES: 89 mg/dL (ref 0.0–149.0)
Total CHOL/HDL Ratio: 3
VLDL: 17.8 mg/dL (ref 0.0–40.0)

## 2015-06-05 LAB — POCT URINALYSIS DIPSTICK
BILIRUBIN UA: NEGATIVE
GLUCOSE UA: NEGATIVE
Ketones, UA: NEGATIVE
Leukocytes, UA: NEGATIVE
Nitrite, UA: NEGATIVE
Protein, UA: NEGATIVE
RBC UA: NEGATIVE
SPEC GRAV UA: 1.025
Urobilinogen, UA: NEGATIVE
pH, UA: 6

## 2015-06-05 LAB — MICROALBUMIN / CREATININE URINE RATIO
Creatinine,U: 34.5 mg/dL
Microalb Creat Ratio: 6.1 mg/g (ref 0.0–30.0)
Microalb, Ur: 2.1 mg/dL — ABNORMAL HIGH (ref 0.0–1.9)

## 2015-06-05 MED ORDER — METFORMIN HCL 1000 MG PO TABS: 1000.0000 mg | ORAL_TABLET | Freq: Two times a day (BID) | ORAL | Status: DC

## 2015-06-05 MED ORDER — AMLODIPINE BESYLATE 5 MG PO TABS: 5.0000 mg | ORAL_TABLET | Freq: Every day | ORAL | Status: DC

## 2015-06-05 MED ORDER — LEVOTHYROXINE SODIUM 100 MCG PO TABS: 100.0000 ug | ORAL_TABLET | Freq: Every day | ORAL | Status: DC

## 2015-06-05 NOTE — Progress Notes (Addendum)
Subjective:    Patient ID: Cheryl Cisneros, female    DOB: Dec 18, 1945, 70 y.o.   MRN: [id number]  HPI    Review of Systems     Objective:   Physical Exam        Assessment & Plan:     Subjective:   Cheryl Cisneros is a 70 y.o. female who presents for Medicare Annual (Subsequent) preventive examination.  HYPERTENSION  Blood pressure range-not checking  Chest pain- non      Dyspnea- no Lightheadedness- no   Edema- no Other side effects - no   Medication compliance: good Low salt diet- yes  DIABETES  Blood Sugar ranges-130-160  Polyuria- no New Visual problems- no Hypoglycemic symptoms- no Other side effects-no Medication compliance - good Last eye exam- 12/2014 Foot exam- 02/2015  HYPERLIPIDEMIA  Medication compliance- good RUQ pain- no  Muscle aches- no Other side effects-no      Review of Systems:   Review of Systems  Constitutional: Negative for activity change, appetite change and fatigue.  HENT: Negative for hearing loss, congestion, tinnitus and ear discharge.   Eyes: Negative for visual disturbance (see optho q56m-  + cataracts.  Respiratory: Negative for cough, chest tightness and shortness of breath.   Cardiovascular: Negative for chest pain, palpitations and leg swelling.  Gastrointestinal: Negative for abdominal pain, diarrhea, constipation and abdominal distention.  Genitourinary: Negative for urgency, frequency, decreased urine volume and difficulty urinating.  Musculoskeletal: Negative for back pain, arthralgias and gait problem.  Skin: Negative for color change, pallor and rash.  Neurological: Negative for dizziness, light-headedness, numbness and headaches.  Hematological: Negative for adenopathy. Does not bruise/bleed easily.  Psychiatric/Behavioral: Negative for suicidal ideas, confusion, sleep disturbance, self-injury, dysphoric mood, decreased concentration and agitation.  Pt is able to read and write and can do all ADLs No  risk for falling No abuse/ violence in home           Objective:     Vitals: BP 130/68 mmHg  Pulse 92  Temp(Src) 98 F (36.7 C) (Oral)  Ht 5' 1.5" (1.562 m)  Wt 145 lb (65.772 kg)  BMI 26.96 kg/m2  SpO2 98% BP 130/68 mmHg  Pulse 92  Temp(Src) 98 F (36.7 C) (Oral)  Ht 5' 1.5" (1.562 m)  Wt 145 lb (65.772 kg)  BMI 26.96 kg/m2  SpO2 98% General appearance: alert, cooperative, appears stated age and no distress Head: Normocephalic, without obvious abnormality, atraumatic Eyes: conjunctivae/corneas clear. PERRL, EOM's intact. Fundi benign. Ears: normal TM's and external ear canals both ears Nose: Nares normal. Septum midline. Mucosa normal. No drainage or sinus tenderness. Throat: lips, mucosa, and tongue normal; teeth and gums normal Neck: no adenopathy, no carotid bruit, no JVD, supple, symmetrical, trachea midline and thyroid not enlarged, symmetric, no tenderness/mass/nodules Back: symmetric, no curvature. ROM normal. No CVA tenderness. Lungs: clear to auscultation bilaterally Breasts: normal appearance, no masses or tenderness Heart: regular rate and rhythm, S1, S2 normal, no murmur, click, rub or gallop Abdomen: soft, non-tender; bowel sounds normal; no masses,  no organomegaly Pelvic: not indicated; status post hysterectomy, negative ROS Extremities: extremities normal, atraumatic, no cyanosis or edema Pulses: 2+ and symmetric Skin: Skin color, texture, turgor normal. No rashes or lesions Lymph nodes: Cervical, supraclavicular, and axillary nodes normal. Neurologic: Alert and oriented X 3, normal strength and tone. Normal symmetric reflexes. Normal coordination and gait Psych- no depression, no anxiety  Tobacco History  Smoking status  . Never Smoker   Smokeless tobacco  .  Never Used     Counseling given: Not Answered   Past Medical History  Diagnosis Date  . Hypertension   . Allergy   . GERD (gastroesophageal reflux disease)   . Thyroid disease      Hypothyroidism  . Asthma   . Macular degeneration 12/25/2011  . Cataract    Past Surgical History  Procedure Laterality Date  . Tonsillectomy    . Knee arthroscopy    . Lumbar laminectomy  1999  . Rotator cuff repair  02/27/2005  . Abdominal hysterectomy  1986    TAH BSO   Family History  Problem Relation Age of Onset  . Coronary artery disease    . Lung cancer    . Diabetes Mother   . Hypertension Mother   . Kidney disease Mother     dialysis  . Diabetes Father   . Hypertension Father   . Cancer Father     lung  . Dementia Sister   . Diabetes Sister   . Hyperlipidemia Sister   . Hypertension Sister   . Diabetes Brother   . Hyperlipidemia Brother   . Hypertension Brother   . Diabetes Sister   . Hypertension Sister   . Hyperlipidemia Sister   . Cervical cancer Daughter   . Cancer Daughter 15    cervical   History  Sexual Activity  . Sexual Activity:  . Partners: Male    Outpatient Encounter Prescriptions as of 06/05/2015  Medication Sig  . alendronate (FOSAMAX) 70 MG tablet Take 1 tablet (70 mg total) by mouth every 7 (seven) days. Take with a full glass of water on an empty stomach.  Marland Kitchen amLODipine (NORVASC) 5 MG tablet Take 1 tablet (5 mg total) by mouth daily.  Marland Kitchen aspirin EC 81 MG tablet Take 1 tablet (81 mg total) by mouth daily.  . Biotin 1000 MCG tablet Take 5,000 mcg by mouth 2 (two) times daily.   . Calcium-Vitamin D 600-200 MG-UNIT per tablet Take 1 tablet by mouth 3 (three) times daily with meals.    . cetirizine (ZYRTEC) 10 MG tablet TAKE ONE TABLET BY MOUTH ONCE DAILY  . Cholecalciferol (VITAMIN D3) 1000 UNITS CAPS Take 1 capsule by mouth daily.    Marland Kitchen EPIPEN 2-PAK 0.3 MG/0.3ML DEVI   . Garlic Oil (SUPER GARLIC) 8916 MG CAPS Take 1 capsule by mouth daily.   Marland Kitchen glucose blood (ONE TOUCH ULTRA TEST) test strip Check blood sugar bid E11.9  . L-Lysine 500 MG TABS Take 1 tablet by mouth daily.    . Lancets (FREESTYLE) lancets TEST BLOOD SUGAR TWICE A DAY. Dx:  E11.9  . levothyroxine (SYNTHROID, LEVOTHROID) 100 MCG tablet Take 1 tablet (100 mcg total) by mouth daily.  . Magnesium 250 MG TABS Take 1 tablet by mouth daily.   . metFORMIN (GLUCOPHAGE) 1000 MG tablet Take 1 tablet (1,000 mg total) by mouth 2 (two) times daily with a meal.  . Omega-3 Fatty Acids (OMEGA 3 PO) Take 2 tablets by mouth daily.   Marland Kitchen omeprazole (PRILOSEC) 40 MG capsule Take 1 capsule (40 mg total) by mouth daily.  Marland Kitchen OVER THE COUNTER MEDICATION Maxi-vision 2 capsules per day  . pravastatin (PRAVACHOL) 10 MG tablet Take 1 tablet (10 mg total) by mouth daily.  Marland Kitchen SIMPLY SALINE NA Place 1 spray into the nose daily.    . Turmeric 500 MG CAPS Take 1 capsule by mouth 2 (two) times daily.  . vitamin C (ASCORBIC ACID) 500 MG tablet Take 1,000 mg  by mouth daily.  . [DISCONTINUED] amLODipine (NORVASC) 5 MG tablet Take 1 tablet (5 mg total) by mouth daily.  . [DISCONTINUED] levothyroxine (SYNTHROID, LEVOTHROID) 100 MCG tablet Take 1 tablet (100 mcg total) by mouth daily.  . [DISCONTINUED] metFORMIN (GLUCOPHAGE) 1000 MG tablet Take 1 tablet (1,000 mg total) by mouth 2 (two) times daily with a meal.   No facility-administered encounter medications on file as of 06/05/2015.    Activities of Daily Living In your present state of health, do you have any difficulty performing the following activities: 06/05/2015 06/05/2015  Hearing? N N  Vision? N N  Difficulty concentrating or making decisions? N N  Walking or climbing stairs? N N  Dressing or bathing? N N  Doing errands, shopping? N N    Patient Care Team: Rosalita Chessman, DO as PCP - General Ulyses Southward, MD as Consulting Physician (Allergy and Immunology) Odette Fraction as Consulting Physician (Optometry)    Assessment:    cpe Exercise Activities and Dietary recommendations-- cont current exercise    Goals    None     Fall Risk Fall Risk  06/05/2015 06/05/2015 03/02/2015 12/28/2013 12/25/2012  Falls in the past year? No No  No No No   Depression Screen PHQ 2/9 Scores 06/05/2015 06/05/2015 03/02/2015 12/28/2013  PHQ - 2 Score 0 0 0 0     Cognitive Testing mmse 30/30  Immunization History  Administered Date(s) Administered  . Influenza Split 02/12/2011  . Influenza Whole 02/01/2009  . Influenza, High Dose Seasonal PF 02/06/2015  . Influenza,inj,Quad PF,36+ Mos 12/28/2013  . Pneumococcal Conjugate-13 06/24/2013  . Pneumococcal Polysaccharide-23 12/29/2007, 12/13/2008  . Td 09/16/2001  . Tdap 06/28/2013  . Zoster 11/12/2007   Screening Tests Health Maintenance  Topic Date Due  . COLONOSCOPY  04/04/2015  . URINE MICROALBUMIN  07/18/2015  . HEMOGLOBIN A1C  08/30/2015  . INFLUENZA VACCINE  11/21/2015  . OPHTHALMOLOGY EXAM  01/04/2016  . MAMMOGRAM  02/17/2016  . FOOT EXAM  03/01/2016  . TETANUS/TDAP  06/29/2023  . DEXA SCAN  Completed  . ZOSTAVAX  Completed  . Hepatitis C Screening  Completed  . PNA vac Low Risk Adult  Completed      Plan:    See AVS During the course of the visit the patient was educated and counseled about the following appropriate screening and preventive services:   Vaccines to include Pneumoccal, Influenza, Hepatitis B, Td, Zostavax, HCV  Electrocardiogram  Cardiovascular Disease  Colorectal cancer screening  Bone density screening  Diabetes screening  Glaucoma screening  Mammography/PAP  Nutrition counseling   Patient Instructions (the written plan) was given to the patient.  1. Preventative health care See AVS See Above - Ambulatory referral to Gastroenterology  2. Hypothyroidism, unspecified hypothyroidism type  - levothyroxine (SYNTHROID, LEVOTHROID) 100 MCG tablet; Take 1 tablet (100 mcg total) by mouth daily.  Dispense: 90 tablet; Refill: 3 - TSH  3. DM (diabetes mellitus) type II controlled peripheral vascular disorder (HCC)  - metFORMIN (GLUCOPHAGE) 1000 MG tablet; Take 1 tablet (1,000 mg total) by mouth 2 (two) times daily with a meal.   Dispense: 180 tablet; Refill: 1 - Comp Met (CMET) - Hemoglobin A1c  4. Essential hypertension stable - amLODipine (NORVASC) 5 MG tablet; Take 1 tablet (5 mg total) by mouth daily.  Dispense: 90 tablet; Refill: 1 - Comp Met (CMET) - CBC with Differential/Platelet - Lipid panel - POCT urinalysis dipstick - Microalbumin / creatinine urine ratio - TSH - Hemoglobin  A1c  5. Hyperlipidemia LDL goal <70 con't statin Check labs  - Lipid panel  Cheryl Koyanagi, DO  06/05/2015

## 2015-06-05 NOTE — Patient Instructions (Signed)
Preventive Care for Adults, Female A healthy lifestyle and preventive care can promote health and wellness. Preventive health guidelines for women include the following key practices.  A routine yearly physical is a good way to check with your health care provider about your health and preventive screening. It is a chance to share any concerns and updates on your health and to receive a thorough exam.  Visit your dentist for a routine exam and preventive care every 6 months. Brush your teeth twice a day and floss once a day. Good oral hygiene prevents tooth decay and gum disease.  The frequency of eye exams is based on your age, health, family medical history, use of contact lenses, and other factors. Follow your health care provider's recommendations for frequency of eye exams.  Eat a healthy diet. Foods like vegetables, fruits, whole grains, low-fat dairy products, and lean protein foods contain the nutrients you need without too many calories. Decrease your intake of foods high in solid fats, added sugars, and salt. Eat the right amount of calories for you.Get information about a proper diet from your health care provider, if necessary.  Regular physical exercise is one of the most important things you can do for your health. Most adults should get at least 150 minutes of moderate-intensity exercise (any activity that increases your heart rate and causes you to sweat) each week. In addition, most adults need muscle-strengthening exercises on 2 or more days a week.  Maintain a healthy weight. The body mass index (BMI) is a screening tool to identify possible weight problems. It provides an estimate of body fat based on height and weight. Your health care provider can find your BMI and can help you achieve or maintain a healthy weight.For adults 20 years and older:  A BMI below 18.5 is considered underweight.  A BMI of 18.5 to 24.9 is normal.  A BMI of 25 to 29.9 is considered overweight.  A  BMI of 30 and above is considered obese.  Maintain normal blood lipids and cholesterol levels by exercising and minimizing your intake of saturated fat. Eat a balanced diet with plenty of fruit and vegetables. Blood tests for lipids and cholesterol should begin at age 45 and be repeated every 5 years. If your lipid or cholesterol levels are high, you are over 50, or you are at high risk for heart disease, you may need your cholesterol levels checked more frequently.Ongoing high lipid and cholesterol levels should be treated with medicines if diet and exercise are not working.  If you smoke, find out from your health care provider how to quit. If you do not use tobacco, do not start.  Lung cancer screening is recommended for adults aged 45-80 years who are at high risk for developing lung cancer because of a history of smoking. A yearly low-dose CT scan of the lungs is recommended for people who have at least a 30-pack-year history of smoking and are a current smoker or have quit within the past 15 years. A pack year of smoking is smoking an average of 1 pack of cigarettes a day for 1 year (for example: 1 pack a day for 30 years or 2 packs a day for 15 years). Yearly screening should continue until the smoker has stopped smoking for at least 15 years. Yearly screening should be stopped for people who develop a health problem that would prevent them from having lung cancer treatment.  If you are pregnant, do not drink alcohol. If you are  breastfeeding, be very cautious about drinking alcohol. If you are not pregnant and choose to drink alcohol, do not have more than 1 drink per day. One drink is considered to be 12 ounces (355 mL) of beer, 5 ounces (148 mL) of wine, or 1.5 ounces (44 mL) of liquor.  Avoid use of street drugs. Do not share needles with anyone. Ask for help if you need support or instructions about stopping the use of drugs.  High blood pressure causes heart disease and increases the risk  of stroke. Your blood pressure should be checked at least every 1 to 2 years. Ongoing high blood pressure should be treated with medicines if weight loss and exercise do not work.  If you are 55-79 years old, ask your health care provider if you should take aspirin to prevent strokes.  Diabetes screening is done by taking a blood sample to check your blood glucose level after you have not eaten for a certain period of time (fasting). If you are not overweight and you do not have risk factors for diabetes, you should be screened once every 3 years starting at age 45. If you are overweight or obese and you are 40-70 years of age, you should be screened for diabetes every year as part of your cardiovascular risk assessment.  Breast cancer screening is essential preventive care for women. You should practice "breast self-awareness." This means understanding the normal appearance and feel of your breasts and may include breast self-examination. Any changes detected, no matter how small, should be reported to a health care provider. Women in their 20s and 30s should have a clinical breast exam (CBE) by a health care provider as part of a regular health exam every 1 to 3 years. After age 40, women should have a CBE every year. Starting at age 40, women should consider having a mammogram (breast X-ray test) every year. Women who have a family history of breast cancer should talk to their health care provider about genetic screening. Women at a high risk of breast cancer should talk to their health care providers about having an MRI and a mammogram every year.  Breast cancer gene (BRCA)-related cancer risk assessment is recommended for women who have family members with BRCA-related cancers. BRCA-related cancers include breast, ovarian, tubal, and peritoneal cancers. Having family members with these cancers may be associated with an increased risk for harmful changes (mutations) in the breast cancer genes BRCA1 and  BRCA2. Results of the assessment will determine the need for genetic counseling and BRCA1 and BRCA2 testing.  Your health care provider may recommend that you be screened regularly for cancer of the pelvic organs (ovaries, uterus, and vagina). This screening involves a pelvic examination, including checking for microscopic changes to the surface of your cervix (Pap test). You may be encouraged to have this screening done every 3 years, beginning at age 21.  For women ages 30-65, health care providers may recommend pelvic exams and Pap testing every 3 years, or they may recommend the Pap and pelvic exam, combined with testing for human papilloma virus (HPV), every 5 years. Some types of HPV increase your risk of cervical cancer. Testing for HPV may also be done on women of any age with unclear Pap test results.  Other health care providers may not recommend any screening for nonpregnant women who are considered low risk for pelvic cancer and who do not have symptoms. Ask your health care provider if a screening pelvic exam is right for   you.  If you have had past treatment for cervical cancer or a condition that could lead to cancer, you need Pap tests and screening for cancer for at least 20 years after your treatment. If Pap tests have been discontinued, your risk factors (such as having a new sexual partner) need to be reassessed to determine if screening should resume. Some women have medical problems that increase the chance of getting cervical cancer. In these cases, your health care provider may recommend more frequent screening and Pap tests.  Colorectal cancer can be detected and often prevented. Most routine colorectal cancer screening begins at the age of 50 years and continues through age 75 years. However, your health care provider may recommend screening at an earlier age if you have risk factors for colon cancer. On a yearly basis, your health care provider may provide home test kits to check  for hidden blood in the stool. Use of a small camera at the end of a tube, to directly examine the colon (sigmoidoscopy or colonoscopy), can detect the earliest forms of colorectal cancer. Talk to your health care provider about this at age 50, when routine screening begins. Direct exam of the colon should be repeated every 5-10 years through age 75 years, unless early forms of precancerous polyps or small growths are found.  People who are at an increased risk for hepatitis B should be screened for this virus. You are considered at high risk for hepatitis B if:  You were born in a country where hepatitis B occurs often. Talk with your health care provider about which countries are considered high risk.  Your parents were born in a high-risk country and you have not received a shot to protect against hepatitis B (hepatitis B vaccine).  You have HIV or AIDS.  You use needles to inject street drugs.  You live with, or have sex with, someone who has hepatitis B.  You get hemodialysis treatment.  You take certain medicines for conditions like cancer, organ transplantation, and autoimmune conditions.  Hepatitis C blood testing is recommended for all people born from 1945 through 1965 and any individual with known risks for hepatitis C.  Practice safe sex. Use condoms and avoid high-risk sexual practices to reduce the spread of sexually transmitted infections (STIs). STIs include gonorrhea, chlamydia, syphilis, trichomonas, herpes, HPV, and human immunodeficiency virus (HIV). Herpes, HIV, and HPV are viral illnesses that have no cure. They can result in disability, cancer, and death.  You should be screened for sexually transmitted illnesses (STIs) including gonorrhea and chlamydia if:  You are sexually active and are younger than 24 years.  You are older than 24 years and your health care provider tells you that you are at risk for this type of infection.  Your sexual activity has changed  since you were last screened and you are at an increased risk for chlamydia or gonorrhea. Ask your health care provider if you are at risk.  If you are at risk of being infected with HIV, it is recommended that you take a prescription medicine daily to prevent HIV infection. This is called preexposure prophylaxis (PrEP). You are considered at risk if:  You are sexually active and do not regularly use condoms or know the HIV status of your partner(s).  You take drugs by injection.  You are sexually active with a partner who has HIV.  Talk with your health care provider about whether you are at high risk of being infected with HIV. If   you choose to begin PrEP, you should first be tested for HIV. You should then be tested every 3 months for as long as you are taking PrEP.  Osteoporosis is a disease in which the bones lose minerals and strength with aging. This can result in serious bone fractures or breaks. The risk of osteoporosis can be identified using a bone density scan. Women ages 67 years and over and women at risk for fractures or osteoporosis should discuss screening with their health care providers. Ask your health care provider whether you should take a calcium supplement or vitamin D to reduce the rate of osteoporosis.  Menopause can be associated with physical symptoms and risks. Hormone replacement therapy is available to decrease symptoms and risks. You should talk to your health care provider about whether hormone replacement therapy is right for you.  Use sunscreen. Apply sunscreen liberally and repeatedly throughout the day. You should seek shade when your shadow is shorter than you. Protect yourself by wearing long sleeves, pants, a wide-brimmed hat, and sunglasses year round, whenever you are outdoors.  Once a month, do a whole body skin exam, using a mirror to look at the skin on your back. Tell your health care provider of new moles, moles that have irregular borders, moles that  are larger than a pencil eraser, or moles that have changed in shape or color.  Stay current with required vaccines (immunizations).  Influenza vaccine. All adults should be immunized every year.  Tetanus, diphtheria, and acellular pertussis (Td, Tdap) vaccine. Pregnant women should receive 1 dose of Tdap vaccine during each pregnancy. The dose should be obtained regardless of the length of time since the last dose. Immunization is preferred during the 27th-36th week of gestation. An adult who has not previously received Tdap or who does not know her vaccine status should receive 1 dose of Tdap. This initial dose should be followed by tetanus and diphtheria toxoids (Td) booster doses every 10 years. Adults with an unknown or incomplete history of completing a 3-dose immunization series with Td-containing vaccines should begin or complete a primary immunization series including a Tdap dose. Adults should receive a Td booster every 10 years.  Varicella vaccine. An adult without evidence of immunity to varicella should receive 2 doses or a second dose if she has previously received 1 dose. Pregnant females who do not have evidence of immunity should receive the first dose after pregnancy. This first dose should be obtained before leaving the health care facility. The second dose should be obtained 4-8 weeks after the first dose.  Human papillomavirus (HPV) vaccine. Females aged 13-26 years who have not received the vaccine previously should obtain the 3-dose series. The vaccine is not recommended for use in pregnant females. However, pregnancy testing is not needed before receiving a dose. If a female is found to be pregnant after receiving a dose, no treatment is needed. In that case, the remaining doses should be delayed until after the pregnancy. Immunization is recommended for any person with an immunocompromised condition through the age of 61 years if she did not get any or all doses earlier. During the  3-dose series, the second dose should be obtained 4-8 weeks after the first dose. The third dose should be obtained 24 weeks after the first dose and 16 weeks after the second dose.  Zoster vaccine. One dose is recommended for adults aged 30 years or older unless certain conditions are present.  Measles, mumps, and rubella (MMR) vaccine. Adults born  before 1957 generally are considered immune to measles and mumps. Adults born in 1957 or later should have 1 or more doses of MMR vaccine unless there is a contraindication to the vaccine or there is laboratory evidence of immunity to each of the three diseases. A routine second dose of MMR vaccine should be obtained at least 28 days after the first dose for students attending postsecondary schools, health care workers, or international travelers. People who received inactivated measles vaccine or an unknown type of measles vaccine during 1963-1967 should receive 2 doses of MMR vaccine. People who received inactivated mumps vaccine or an unknown type of mumps vaccine before 1979 and are at high risk for mumps infection should consider immunization with 2 doses of MMR vaccine. For females of childbearing age, rubella immunity should be determined. If there is no evidence of immunity, females who are not pregnant should be vaccinated. If there is no evidence of immunity, females who are pregnant should delay immunization until after pregnancy. Unvaccinated health care workers born before 1957 who lack laboratory evidence of measles, mumps, or rubella immunity or laboratory confirmation of disease should consider measles and mumps immunization with 2 doses of MMR vaccine or rubella immunization with 1 dose of MMR vaccine.  Pneumococcal 13-valent conjugate (PCV13) vaccine. When indicated, a person who is uncertain of his immunization history and has no record of immunization should receive the PCV13 vaccine. All adults 65 years of age and older should receive this  vaccine. An adult aged 19 years or older who has certain medical conditions and has not been previously immunized should receive 1 dose of PCV13 vaccine. This PCV13 should be followed with a dose of pneumococcal polysaccharide (PPSV23) vaccine. Adults who are at high risk for pneumococcal disease should obtain the PPSV23 vaccine at least 8 weeks after the dose of PCV13 vaccine. Adults older than 72 years of age who have normal immune system function should obtain the PPSV23 vaccine dose at least 1 year after the dose of PCV13 vaccine.  Pneumococcal polysaccharide (PPSV23) vaccine. When PCV13 is also indicated, PCV13 should be obtained first. All adults aged 65 years and older should be immunized. An adult younger than age 65 years who has certain medical conditions should be immunized. Any person who resides in a nursing home or long-term care facility should be immunized. An adult smoker should be immunized. People with an immunocompromised condition and certain other conditions should receive both PCV13 and PPSV23 vaccines. People with human immunodeficiency virus (HIV) infection should be immunized as soon as possible after diagnosis. Immunization during chemotherapy or radiation therapy should be avoided. Routine use of PPSV23 vaccine is not recommended for American Indians, [place] Natives, or people younger than 65 years unless there are medical conditions that require PPSV23 vaccine. When indicated, people who have unknown immunization and have no record of immunization should receive PPSV23 vaccine. One-time revaccination 5 years after the first dose of PPSV23 is recommended for people aged 19-64 years who have chronic kidney failure, nephrotic syndrome, asplenia, or immunocompromised conditions. People who received 1-2 doses of PPSV23 before age 65 years should receive another dose of PPSV23 vaccine at age 65 years or later if at least 5 years have passed since the previous dose. Doses of PPSV23 are not  needed for people immunized with PPSV23 at or after age 65 years.  Meningococcal vaccine. Adults with asplenia or persistent complement component deficiencies should receive 2 doses of quadrivalent meningococcal conjugate (MenACWY-D) vaccine. The doses should be obtained   at least 2 months apart. Microbiologists working with certain meningococcal bacteria, [place] recruits, people at risk during an outbreak, and people who travel to or live in countries with a high rate of meningitis should be immunized. A first-year college student up through age 34 years who is living in a residence hall should receive a dose if she did not receive a dose on or after her 16th birthday. Adults who have certain high-risk conditions should receive one or more doses of vaccine.  Hepatitis A vaccine. Adults who wish to be protected from this disease, have certain high-risk conditions, work with hepatitis A-infected animals, work in hepatitis A research labs, or travel to or work in countries with a high rate of hepatitis A should be immunized. Adults who were previously unvaccinated and who anticipate close contact with an international adoptee during the first 60 days after arrival in the [place] from a country with a high rate of hepatitis A should be immunized.  Hepatitis B vaccine. Adults who wish to be protected from this disease, have certain high-risk conditions, may be exposed to blood or other infectious body fluids, are household contacts or sex partners of hepatitis B positive people, are clients or workers in certain care facilities, or travel to or work in countries with a high rate of hepatitis B should be immunized.  Haemophilus influenzae type b (Hib) vaccine. A previously unvaccinated person with asplenia or sickle cell disease or having a scheduled splenectomy should receive 1 dose of Hib vaccine. Regardless of previous immunization, a recipient of a hematopoietic stem cell transplant should receive a  3-dose series 6-12 months after her successful transplant. Hib vaccine is not recommended for adults with HIV infection. Preventive Services / Frequency Ages 35 to 4 years  Blood pressure check.** / Every 3-5 years.  Lipid and cholesterol check.** / Every 5 years beginning at age 60.  Clinical breast exam.** / Every 3 years for women in their 71s and 10s.  BRCA-related cancer risk assessment.** / For women who have family members with a BRCA-related cancer (breast, ovarian, tubal, or peritoneal cancers).  Pap test.** / Every 2 years from ages 76 through 26. Every 3 years starting at age 61 through age 76 or 93 with a history of 3 consecutive normal Pap tests.  HPV screening.** / Every 3 years from ages 37 through ages 60 to 51 with a history of 3 consecutive normal Pap tests.  Hepatitis C blood test.** / For any individual with known risks for hepatitis C.  Skin self-exam. / Monthly.  Influenza vaccine. / Every year.  Tetanus, diphtheria, and acellular pertussis (Tdap, Td) vaccine.** / Consult your health care provider. Pregnant women should receive 1 dose of Tdap vaccine during each pregnancy. 1 dose of Td every 10 years.  Varicella vaccine.** / Consult your health care provider. Pregnant females who do not have evidence of immunity should receive the first dose after pregnancy.  HPV vaccine. / 3 doses over 6 months, if 93 and younger. The vaccine is not recommended for use in pregnant females. However, pregnancy testing is not needed before receiving a dose.  Measles, mumps, rubella (MMR) vaccine.** / You need at least 1 dose of MMR if you were born in 1957 or later. You may also need a 2nd dose. For females of childbearing age, rubella immunity should be determined. If there is no evidence of immunity, females who are not pregnant should be vaccinated. If there is no evidence of immunity, females who are  pregnant should delay immunization until after pregnancy.  Pneumococcal  13-valent conjugate (PCV13) vaccine.** / Consult your health care provider.  Pneumococcal polysaccharide (PPSV23) vaccine.** / 1 to 2 doses if you smoke cigarettes or if you have certain conditions.  Meningococcal vaccine.** / 1 dose if you are age 68 to 8 years and a Market researcher living in a residence hall, or have one of several medical conditions, you need to get vaccinated against meningococcal disease. You may also need additional booster doses.  Hepatitis A vaccine.** / Consult your health care provider.  Hepatitis B vaccine.** / Consult your health care provider.  Haemophilus influenzae type b (Hib) vaccine.** / Consult your health care provider. Ages 7 to 53 years  Blood pressure check.** / Every year.  Lipid and cholesterol check.** / Every 5 years beginning at age 25 years.  Lung cancer screening. / Every year if you are aged 11-80 years and have a 30-pack-year history of smoking and currently smoke or have quit within the past 15 years. Yearly screening is stopped once you have quit smoking for at least 15 years or develop a health problem that would prevent you from having lung cancer treatment.  Clinical breast exam.** / Every year after age 48 years.  BRCA-related cancer risk assessment.** / For women who have family members with a BRCA-related cancer (breast, ovarian, tubal, or peritoneal cancers).  Mammogram.** / Every year beginning at age 41 years and continuing for as long as you are in good health. Consult with your health care provider.  Pap test.** / Every 3 years starting at age 65 years through age 37 or 70 years with a history of 3 consecutive normal Pap tests.  HPV screening.** / Every 3 years from ages 72 years through ages 60 to 40 years with a history of 3 consecutive normal Pap tests.  Fecal occult blood test (FOBT) of stool. / Every year beginning at age 21 years and continuing until age 5 years. You may not need to do this test if you get  a colonoscopy every 10 years.  Flexible sigmoidoscopy or colonoscopy.** / Every 5 years for a flexible sigmoidoscopy or every 10 years for a colonoscopy beginning at age 35 years and continuing until age 48 years.  Hepatitis C blood test.** / For all people born from [birth year] through 1965 and any individual with known risks for hepatitis C.  Skin self-exam. / Monthly.  Influenza vaccine. / Every year.  Tetanus, diphtheria, and acellular pertussis (Tdap/Td) vaccine.** / Consult your health care provider. Pregnant women should receive 1 dose of Tdap vaccine during each pregnancy. 1 dose of Td every 10 years.  Varicella vaccine.** / Consult your health care provider. Pregnant females who do not have evidence of immunity should receive the first dose after pregnancy.  Zoster vaccine.** / 1 dose for adults aged 30 years or older.  Measles, mumps, rubella (MMR) vaccine.** / You need at least 1 dose of MMR if you were born in 1957 or later. You may also need a second dose. For females of childbearing age, rubella immunity should be determined. If there is no evidence of immunity, females who are not pregnant should be vaccinated. If there is no evidence of immunity, females who are pregnant should delay immunization until after pregnancy.  Pneumococcal 13-valent conjugate (PCV13) vaccine.** / Consult your health care provider.  Pneumococcal polysaccharide (PPSV23) vaccine.** / 1 to 2 doses if you smoke cigarettes or if you have certain conditions.  Meningococcal vaccine.** /  Consult your health care provider.  Hepatitis A vaccine.** / Consult your health care provider.  Hepatitis B vaccine.** / Consult your health care provider.  Haemophilus influenzae type b (Hib) vaccine.** / Consult your health care provider. Ages 64 years and over  Blood pressure check.** / Every year.  Lipid and cholesterol check.** / Every 5 years beginning at age 23 years.  Lung cancer screening. / Every year if you  are aged 16-80 years and have a 30-pack-year history of smoking and currently smoke or have quit within the past 15 years. Yearly screening is stopped once you have quit smoking for at least 15 years or develop a health problem that would prevent you from having lung cancer treatment.  Clinical breast exam.** / Every year after age 74 years.  BRCA-related cancer risk assessment.** / For women who have family members with a BRCA-related cancer (breast, ovarian, tubal, or peritoneal cancers).  Mammogram.** / Every year beginning at age 44 years and continuing for as long as you are in good health. Consult with your health care provider.  Pap test.** / Every 3 years starting at age 58 years through age 22 or 39 years with 3 consecutive normal Pap tests. Testing can be stopped between 65 and 70 years with 3 consecutive normal Pap tests and no abnormal Pap or HPV tests in the past 10 years.  HPV screening.** / Every 3 years from ages 64 years through ages 70 or 61 years with a history of 3 consecutive normal Pap tests. Testing can be stopped between 65 and 70 years with 3 consecutive normal Pap tests and no abnormal Pap or HPV tests in the past 10 years.  Fecal occult blood test (FOBT) of stool. / Every year beginning at age 40 years and continuing until age 27 years. You may not need to do this test if you get a colonoscopy every 10 years.  Flexible sigmoidoscopy or colonoscopy.** / Every 5 years for a flexible sigmoidoscopy or every 10 years for a colonoscopy beginning at age 7 years and continuing until age 32 years.  Hepatitis C blood test.** / For all people born from [birth year] through 1967 and any individual with known risks for hepatitis C.  Osteoporosis screening.** / A one-time screening for women ages 30 years and over and women at risk for fractures or osteoporosis.  Skin self-exam. / Monthly.  Influenza vaccine. / Every year.  Tetanus, diphtheria, and acellular pertussis (Tdap/Td)  vaccine.** / 1 dose of Td every 10 years.  Varicella vaccine.** / Consult your health care provider.  Zoster vaccine.** / 1 dose for adults aged 35 years or older.  Pneumococcal 13-valent conjugate (PCV13) vaccine.** / Consult your health care provider.  Pneumococcal polysaccharide (PPSV23) vaccine.** / 1 dose for all adults aged 46 years and older.  Meningococcal vaccine.** / Consult your health care provider.  Hepatitis A vaccine.** / Consult your health care provider.  Hepatitis B vaccine.** / Consult your health care provider.  Haemophilus influenzae type b (Hib) vaccine.** / Consult your health care provider. ** Family history and personal history of risk and conditions may change your health care provider's recommendations.   This information is not intended to replace advice given to you by your health care provider. Make sure you discuss any questions you have with your health care provider.   Document Released: 06/04/2001 Document Revised: 04/29/2014 Document Reviewed: 09/03/2010 Elsevier Interactive Patient Education [employer].

## 2015-06-05 NOTE — Progress Notes (Signed)
Pre visit review using our clinic review tool, if applicable. No additional management support is needed unless otherwise documented below in the visit note. 

## 2015-06-07 NOTE — Addendum Note (Signed)
Addended byDamita Dunnings D on: 06/07/2015 11:20 AM   Modules accepted: Orders

## 2015-06-12 DIAGNOSIS — J3081 Allergic rhinitis due to animal (cat) (dog) hair and dander: Secondary | ICD-10-CM | POA: Diagnosis not present

## 2015-06-12 DIAGNOSIS — J3089 Other allergic rhinitis: Secondary | ICD-10-CM | POA: Diagnosis not present

## 2015-06-12 DIAGNOSIS — J301 Allergic rhinitis due to pollen: Secondary | ICD-10-CM | POA: Diagnosis not present

## 2015-06-16 DIAGNOSIS — J3081 Allergic rhinitis due to animal (cat) (dog) hair and dander: Secondary | ICD-10-CM | POA: Diagnosis not present

## 2015-06-16 DIAGNOSIS — J3089 Other allergic rhinitis: Secondary | ICD-10-CM | POA: Diagnosis not present

## 2015-06-22 ENCOUNTER — Other Ambulatory Visit: Payer: Self-pay | Admitting: Family Medicine

## 2015-07-11 DIAGNOSIS — J3081 Allergic rhinitis due to animal (cat) (dog) hair and dander: Secondary | ICD-10-CM | POA: Diagnosis not present

## 2015-07-11 DIAGNOSIS — J301 Allergic rhinitis due to pollen: Secondary | ICD-10-CM | POA: Diagnosis not present

## 2015-07-11 DIAGNOSIS — J3089 Other allergic rhinitis: Secondary | ICD-10-CM | POA: Diagnosis not present

## 2015-07-14 DIAGNOSIS — J301 Allergic rhinitis due to pollen: Secondary | ICD-10-CM | POA: Diagnosis not present

## 2015-07-14 DIAGNOSIS — J3089 Other allergic rhinitis: Secondary | ICD-10-CM | POA: Diagnosis not present

## 2015-07-14 DIAGNOSIS — J3081 Allergic rhinitis due to animal (cat) (dog) hair and dander: Secondary | ICD-10-CM | POA: Diagnosis not present

## 2015-07-17 DIAGNOSIS — J3081 Allergic rhinitis due to animal (cat) (dog) hair and dander: Secondary | ICD-10-CM | POA: Diagnosis not present

## 2015-07-17 DIAGNOSIS — J3089 Other allergic rhinitis: Secondary | ICD-10-CM | POA: Diagnosis not present

## 2015-07-17 DIAGNOSIS — J301 Allergic rhinitis due to pollen: Secondary | ICD-10-CM | POA: Diagnosis not present

## 2015-07-21 DIAGNOSIS — J3081 Allergic rhinitis due to animal (cat) (dog) hair and dander: Secondary | ICD-10-CM | POA: Diagnosis not present

## 2015-07-21 DIAGNOSIS — J3089 Other allergic rhinitis: Secondary | ICD-10-CM | POA: Diagnosis not present

## 2015-07-21 DIAGNOSIS — J301 Allergic rhinitis due to pollen: Secondary | ICD-10-CM | POA: Diagnosis not present

## 2015-07-25 DIAGNOSIS — J3081 Allergic rhinitis due to animal (cat) (dog) hair and dander: Secondary | ICD-10-CM | POA: Diagnosis not present

## 2015-07-25 DIAGNOSIS — J3089 Other allergic rhinitis: Secondary | ICD-10-CM | POA: Diagnosis not present

## 2015-07-25 DIAGNOSIS — J301 Allergic rhinitis due to pollen: Secondary | ICD-10-CM | POA: Diagnosis not present

## 2015-07-26 ENCOUNTER — Ambulatory Visit (AMBULATORY_SURGERY_CENTER): Payer: Self-pay | Admitting: *Deleted

## 2015-07-26 VITALS — Ht 61.0 in | Wt 145.4 lb

## 2015-07-26 DIAGNOSIS — Z1211 Encounter for screening for malignant neoplasm of colon: Secondary | ICD-10-CM

## 2015-07-26 MED ORDER — NA SULFATE-K SULFATE-MG SULF 17.5-3.13-1.6 GM/177ML PO SOLN: ORAL | Status: DC

## 2015-07-26 NOTE — Progress Notes (Signed)
No allergies to eggs or soy. No problems with anesthesia.  Pt given Emmi instructions for colonoscopy  No oxygen use  No diet drug use  

## 2015-08-09 ENCOUNTER — Encounter: Payer: Self-pay | Admitting: Gastroenterology

## 2015-08-09 ENCOUNTER — Ambulatory Visit (AMBULATORY_SURGERY_CENTER): Payer: Medicare Other | Admitting: Gastroenterology

## 2015-08-09 VITALS — BP 126/62 | HR 81 | Temp 97.3°F | Resp 12 | Ht 61.0 in | Wt 145.0 lb

## 2015-08-09 DIAGNOSIS — K219 Gastro-esophageal reflux disease without esophagitis: Secondary | ICD-10-CM | POA: Diagnosis not present

## 2015-08-09 DIAGNOSIS — I340 Nonrheumatic mitral (valve) insufficiency: Secondary | ICD-10-CM | POA: Diagnosis not present

## 2015-08-09 DIAGNOSIS — J45909 Unspecified asthma, uncomplicated: Secondary | ICD-10-CM | POA: Diagnosis not present

## 2015-08-09 DIAGNOSIS — D125 Benign neoplasm of sigmoid colon: Secondary | ICD-10-CM | POA: Diagnosis not present

## 2015-08-09 DIAGNOSIS — K649 Unspecified hemorrhoids: Secondary | ICD-10-CM | POA: Diagnosis not present

## 2015-08-09 DIAGNOSIS — I10 Essential (primary) hypertension: Secondary | ICD-10-CM | POA: Diagnosis not present

## 2015-08-09 DIAGNOSIS — Z1211 Encounter for screening for malignant neoplasm of colon: Secondary | ICD-10-CM

## 2015-08-09 DIAGNOSIS — E119 Type 2 diabetes mellitus without complications: Secondary | ICD-10-CM | POA: Diagnosis not present

## 2015-08-09 DIAGNOSIS — E039 Hypothyroidism, unspecified: Secondary | ICD-10-CM | POA: Diagnosis not present

## 2015-08-09 LAB — GLUCOSE, CAPILLARY
GLUCOSE-CAPILLARY: 107 mg/dL — AB (ref 65–99)
Glucose-Capillary: 125 mg/dL — ABNORMAL HIGH (ref 65–99)

## 2015-08-09 MED ORDER — SODIUM CHLORIDE 0.9 % IV SOLN: 500.0000 mL | INTRAVENOUS | Status: DC

## 2015-08-09 NOTE — Progress Notes (Signed)
Stable to RR 

## 2015-08-09 NOTE — Patient Instructions (Signed)
YOU HAD AN ENDOSCOPIC PROCEDURE TODAY AT [facility]:   Refer to the procedure report that was given to you for any specific questions about what was found during the examination.  If the procedure report does not answer your questions, please call your gastroenterologist to clarify.  If you requested that your care partner not be given the details of your procedure findings, then the procedure report has been included in a sealed envelope for you to review at your convenience later.  YOU SHOULD EXPECT: Some feelings of bloating in the abdomen. Passage of more gas than usual.  Walking can help get rid of the air that was put into your GI tract during the procedure and reduce the bloating. If you had a lower endoscopy (such as a colonoscopy or flexible sigmoidoscopy) you may notice spotting of blood in your stool or on the toilet paper. If you underwent a bowel prep for your procedure, you may not have a normal bowel movement for a few days.  Please Note:  You might notice some irritation and congestion in your nose or some drainage.  This is from the oxygen used during your procedure.  There is no need for concern and it should clear up in a day or so.  SYMPTOMS TO REPORT IMMEDIATELY:   Following lower endoscopy (colonoscopy or flexible sigmoidoscopy):  Excessive amounts of blood in the stool  Significant tenderness or worsening of abdominal pains  Swelling of the abdomen that is new, acute  Fever of 100F or higher    For urgent or emergent issues, a gastroenterologist can be reached at any hour by calling [phone].   DIET: Your first meal following the procedure should be a small meal and then it is ok to progress to your normal diet. Heavy or fried foods are harder to digest and may make you feel nauseous or bloated.  Likewise, meals heavy in dairy and vegetables can increase bloating.  Drink plenty of fluids but you should avoid alcoholic beverages for 24  hours.  ACTIVITY:  You should plan to take it easy for the rest of today and you should NOT DRIVE or use heavy machinery until tomorrow (because of the sedation medicines used during the test).    FOLLOW UP: Our staff will call the number listed on your records the next business day following your procedure to check on you and address any questions or concerns that you may have regarding the information given to you following your procedure. If we do not reach you, we will leave a message.  However, if you are feeling well and you are not experiencing any problems, there is no need to return our call.  We will assume that you have returned to your regular daily activities without incident.  If any biopsies were taken you will be contacted by phone or by letter within the next 1-3 weeks.  Please call us at [phone] if you have not heard about the biopsies in 3 weeks.    SIGNATURES/CONFIDENTIALITY: You and/or your care partner have signed paperwork which will be entered into your electronic medical record.  These signatures attest to the fact that that the information above on your After Visit Summary has been reviewed and is understood.  Full responsibility of the confidentiality of this discharge information lies with you and/or your care-partner.   Resume medications. Information given on polyps,diverticulosis,hemorroids and high fbier diet.

## 2015-08-09 NOTE — Op Note (Signed)
[facility] Patient Name: Cheryl Cisneros Procedure Date: 08/09/2015 9:12 AM MRN: [id number] Endoscopist: Ladene Artist , MD Age: 70 Date of Birth: Feb 04, 1946 Gender: Female Procedure:                Colonoscopy Indications:              Screening for colorectal malignant neoplasm Medicines:                Monitored Anesthesia Care Procedure:                Pre-Anesthesia Assessment:                           - Prior to the procedure, a History and Physical                            was performed, and patient medications and                            allergies were reviewed. The patient's tolerance of                            previous anesthesia was also reviewed. The risks                            and benefits of the procedure and the sedation                            options and risks were discussed with the patient.                            All questions were answered, and informed consent                            was obtained. Prior Anticoagulants: The patient has                            taken no previous anticoagulant or antiplatelet                            agents. ASA Grade Assessment: II - A patient with                            mild systemic disease. After reviewing the risks                            and benefits, the patient was deemed in                            satisfactory condition to undergo the procedure.                           After obtaining informed consent, the colonoscope  was passed under direct vision. Throughout the                            procedure, the patient's blood pressure, pulse, and                            oxygen saturations were monitored continuously. The                            Model PCF-[id number]) scope was introduced                            through the anus and advanced to the the cecum,                            identified by appendiceal orifice and ileocecal                          valve. The ileocecal valve, appendiceal orifice,                            and rectum were photographed. The quality of the                            bowel preparation was good. The colonoscopy was                            performed without difficulty. The patient tolerated                            the procedure well. Scope In: 9:29:52 AM Scope Out: 9:44:50 AM Scope Withdrawal Time: 0 hours 11 minutes 56 seconds  Total Procedure Duration: 0 hours 14 minutes 58 seconds  Findings:                 A 7 mm polyp was found in the sigmoid colon. The                            polyp was semi-pedunculated. The polyp was removed                            with a cold snare. Resection and retrieval were                            complete.                           Internal hemorrhoids were found during                            retroflexion. The hemorrhoids were small and Grade                            I (internal hemorrhoids that do not prolapse).  Multiple small-mouthed diverticula were found in                            the sigmoid colon and descending colon.                           No additional abnormalities were found on                            retroflexion.                           The exam was otherwise normal throughout the                            examined colon. Complications:            No immediate complications. Estimated blood loss:                            None. Estimated Blood Loss:     Estimated blood loss: none. Impression:               - One 7 mm polyp in the sigmoid colon, removed with                            a cold snare. Resected and retrieved.                           - Internal hemorrhoids.                           - Mild diverticulosis in the sigmoid colon and in                            the descending colon. Recommendation:           - Repeat colonoscopy in 5 years for surveillance if                             polyp is precancerous, otherwise no plans for                            repeat screeening colonoscopy due to age.                           - Patient has a contact number available for                            emergencies. The signs and symptoms of potential                            delayed complications were discussed with the                            patient. Return to normal activities tomorrow.  Written discharge instructions were provided to the                            patient.                           - Resume previous diet.                           - Continue present medications.                           - Await pathology results. Ladene Artist, MD 08/09/2015 9:49:27 AM This report has been signed electronically.

## 2015-08-09 NOTE — Progress Notes (Signed)
Called to room to assist during endoscopic procedure.  Patient ID and intended procedure confirmed with present staff. Received instructions for my participation in the procedure from the performing physician.  

## 2015-08-10 ENCOUNTER — Telehealth: Payer: Self-pay

## 2015-08-10 NOTE — Telephone Encounter (Signed)
  Follow up Call-  Call back number 08/09/2015  Post procedure Call Back phone  # [phone]  Permission to leave phone message Yes     Patient questions:  Do you have a fever, pain , or abdominal swelling? No. Pain Score  0 *  Have you tolerated food without any problems? Yes.    Have you been able to return to your normal activities? Yes.    Do you have any questions about your discharge instructions: Diet   No. Medications  No. Follow up visit  No.  Do you have questions or concerns about your Care? No.  Actions: * If pain score is 4 or above: No action needed, pain <4.

## 2015-08-16 ENCOUNTER — Encounter: Payer: Self-pay | Admitting: Gastroenterology

## 2015-08-22 DIAGNOSIS — J3081 Allergic rhinitis due to animal (cat) (dog) hair and dander: Secondary | ICD-10-CM | POA: Diagnosis not present

## 2015-08-22 DIAGNOSIS — J301 Allergic rhinitis due to pollen: Secondary | ICD-10-CM | POA: Diagnosis not present

## 2015-08-22 DIAGNOSIS — J3089 Other allergic rhinitis: Secondary | ICD-10-CM | POA: Diagnosis not present

## 2015-09-19 ENCOUNTER — Other Ambulatory Visit: Payer: Self-pay | Admitting: Family Medicine

## 2015-09-21 DIAGNOSIS — J3081 Allergic rhinitis due to animal (cat) (dog) hair and dander: Secondary | ICD-10-CM | POA: Diagnosis not present

## 2015-09-21 DIAGNOSIS — J301 Allergic rhinitis due to pollen: Secondary | ICD-10-CM | POA: Diagnosis not present

## 2015-09-21 DIAGNOSIS — J3089 Other allergic rhinitis: Secondary | ICD-10-CM | POA: Diagnosis not present

## 2015-10-12 DIAGNOSIS — J3081 Allergic rhinitis due to animal (cat) (dog) hair and dander: Secondary | ICD-10-CM | POA: Diagnosis not present

## 2015-10-12 DIAGNOSIS — J3089 Other allergic rhinitis: Secondary | ICD-10-CM | POA: Diagnosis not present

## 2015-10-12 DIAGNOSIS — J301 Allergic rhinitis due to pollen: Secondary | ICD-10-CM | POA: Diagnosis not present

## 2015-10-31 ENCOUNTER — Telehealth: Payer: Self-pay | Admitting: Family Medicine

## 2015-10-31 DIAGNOSIS — E785 Hyperlipidemia, unspecified: Secondary | ICD-10-CM

## 2015-10-31 MED ORDER — PRAVASTATIN SODIUM 10 MG PO TABS: 10.0000 mg | ORAL_TABLET | Freq: Every day | ORAL | Status: DC

## 2015-10-31 NOTE — Telephone Encounter (Signed)
Relation to [id number] Call back [phone] Pharmacy: [place], [place] - [address] [phone] (Phone) [phone] (Fax)         Reason for call:  Pharmacy requesting a refill pravastatin (PRAVACHOL) 10 MG tablet  Due to patient being in town.

## 2015-10-31 NOTE — Telephone Encounter (Signed)
Rx faxed.Marland KitchenPlease schedule lab apt. The order is in.     [place]

## 2015-11-02 MED ORDER — PRAVASTATIN SODIUM 10 MG PO TABS: 10.0000 mg | ORAL_TABLET | Freq: Every day | ORAL | Status: DC

## 2015-11-02 NOTE — Telephone Encounter (Addendum)
Patient is currently in [place] and will return back 11/28/2015 for appointment.

## 2015-11-21 DIAGNOSIS — J3081 Allergic rhinitis due to animal (cat) (dog) hair and dander: Secondary | ICD-10-CM | POA: Diagnosis not present

## 2015-11-21 DIAGNOSIS — J301 Allergic rhinitis due to pollen: Secondary | ICD-10-CM | POA: Diagnosis not present

## 2015-11-21 DIAGNOSIS — J3089 Other allergic rhinitis: Secondary | ICD-10-CM | POA: Diagnosis not present

## 2015-11-28 ENCOUNTER — Ambulatory Visit (INDEPENDENT_AMBULATORY_CARE_PROVIDER_SITE_OTHER): Payer: Medicare Other | Admitting: Family Medicine

## 2015-11-28 ENCOUNTER — Encounter: Payer: Self-pay | Admitting: Family Medicine

## 2015-11-28 VITALS — BP 138/40 | HR 84 | Temp 98.1°F | Resp 16 | Ht 61.0 in | Wt 139.4 lb

## 2015-11-28 DIAGNOSIS — E039 Hypothyroidism, unspecified: Secondary | ICD-10-CM | POA: Diagnosis not present

## 2015-11-28 DIAGNOSIS — E785 Hyperlipidemia, unspecified: Secondary | ICD-10-CM

## 2015-11-28 DIAGNOSIS — I10 Essential (primary) hypertension: Secondary | ICD-10-CM | POA: Diagnosis not present

## 2015-11-28 DIAGNOSIS — M5442 Lumbago with sciatica, left side: Secondary | ICD-10-CM | POA: Diagnosis not present

## 2015-11-28 DIAGNOSIS — IMO0002 Reserved for concepts with insufficient information to code with codable children: Secondary | ICD-10-CM

## 2015-11-28 DIAGNOSIS — E1151 Type 2 diabetes mellitus with diabetic peripheral angiopathy without gangrene: Secondary | ICD-10-CM | POA: Diagnosis not present

## 2015-11-28 DIAGNOSIS — E1165 Type 2 diabetes mellitus with hyperglycemia: Secondary | ICD-10-CM | POA: Diagnosis not present

## 2015-11-28 LAB — POCT URINALYSIS DIPSTICK
Bilirubin, UA: NEGATIVE
GLUCOSE UA: NEGATIVE
Ketones, UA: NEGATIVE
Leukocytes, UA: NEGATIVE
Nitrite, UA: NEGATIVE
Protein, UA: NEGATIVE
RBC UA: NEGATIVE
SPEC GRAV UA: 1.015
UROBILINOGEN UA: 0.2
pH, UA: 6

## 2015-11-28 LAB — LIPID PANEL
Cholesterol: 122 mg/dL (ref 0–200)
HDL: 46.4 mg/dL (ref >39.00)
LDL CALC: 55 mg/dL (ref 0–99)
NonHDL: 75.31
TRIGLYCERIDES: 104 mg/dL (ref 0.0–149.0)
Total CHOL/HDL Ratio: 3
VLDL: 20.8 mg/dL (ref 0.0–40.0)

## 2015-11-28 LAB — COMPREHENSIVE METABOLIC PANEL
ALT: 24 U/L (ref 0–35)
AST: 20 U/L (ref 0–37)
Albumin: 4.7 g/dL (ref 3.5–5.2)
Alkaline Phosphatase: 29 U/L — ABNORMAL LOW (ref 39–117)
BUN: 14 mg/dL (ref 6–23)
CALCIUM: 9.8 mg/dL (ref 8.4–10.5)
CHLORIDE: 101 meq/L (ref 96–112)
CO2: 27 meq/L (ref 19–32)
Creatinine, Ser: 0.77 mg/dL (ref 0.40–1.20)
GFR: 78.29 mL/min (ref >60.00)
Glucose, Bld: 124 mg/dL — ABNORMAL HIGH (ref 70–99)
Potassium: 3.9 mEq/L (ref 3.5–5.1)
Sodium: 140 mEq/L (ref 135–145)
Total Bilirubin: 0.4 mg/dL (ref 0.2–1.2)
Total Protein: 7.9 g/dL (ref 6.0–8.3)

## 2015-11-28 LAB — CBC WITH DIFFERENTIAL/PLATELET
BASOS PCT: 0.3 % (ref 0.0–3.0)
Basophils Absolute: 0 10*3/uL (ref 0.0–0.1)
EOS ABS: 0.1 10*3/uL (ref 0.0–0.7)
Eosinophils Relative: 1.3 % (ref 0.0–5.0)
HEMATOCRIT: 39.7 % (ref 36.0–46.0)
Hemoglobin: 13.4 g/dL (ref 12.0–15.0)
LYMPHS ABS: 2.6 10*3/uL (ref 0.7–4.0)
LYMPHS PCT: 41.3 % (ref 12.0–46.0)
MCHC: 33.7 g/dL (ref 30.0–36.0)
MCV: 92.3 fl (ref 78.0–100.0)
MONO ABS: 0.3 10*3/uL (ref 0.1–1.0)
Monocytes Relative: 4.2 % (ref 3.0–12.0)
NEUTROS ABS: 3.3 10*3/uL (ref 1.4–7.7)
Neutrophils Relative %: 52.9 % (ref 43.0–77.0)
PLATELETS: 223 10*3/uL (ref 150.0–400.0)
RBC: 4.3 Mil/uL (ref 3.87–5.11)
RDW: 13.9 % (ref 11.5–15.5)
WBC: 6.2 10*3/uL (ref 4.0–10.5)

## 2015-11-28 LAB — TSH: TSH: 1.31 u[IU]/mL (ref 0.35–4.50)

## 2015-11-28 LAB — HEMOGLOBIN A1C: Hgb A1c MFr Bld: 6.9 % — ABNORMAL HIGH (ref 4.6–6.5)

## 2015-11-28 MED ORDER — METFORMIN HCL 1000 MG PO TABS: 1000.0000 mg | ORAL_TABLET | Freq: Two times a day (BID) | ORAL | 1 refills | Status: DC

## 2015-11-28 MED ORDER — PRAVASTATIN SODIUM 10 MG PO TABS: 10.0000 mg | ORAL_TABLET | Freq: Every day | ORAL | 0 refills | Status: DC

## 2015-11-28 MED ORDER — HYDROCODONE-ACETAMINOPHEN 5-325 MG PO TABS: 1.0000 | ORAL_TABLET | Freq: Four times a day (QID) | ORAL | 0 refills | Status: DC | PRN

## 2015-11-28 MED ORDER — AMLODIPINE BESYLATE 5 MG PO TABS: 5.0000 mg | ORAL_TABLET | Freq: Every day | ORAL | 1 refills | Status: DC

## 2015-11-28 MED ORDER — LEVOTHYROXINE SODIUM 100 MCG PO TABS: 100.0000 ug | ORAL_TABLET | Freq: Every day | ORAL | 3 refills | Status: DC

## 2015-11-28 NOTE — Assessment & Plan Note (Signed)
Stable con't meds 

## 2015-11-28 NOTE — Progress Notes (Signed)
Patient ID: Cheryl Cisneros, female    DOB: 05/03/1945  Age: 70 y.o. MRN: [id number]    Subjective:  Subjective  HPI Cheryl Cisneros presents for f/u bp, cholesterol and dm. She is also c/o low back and L hip pain x 2 months that has progressively gotten worse.  She states it feels exactly like before she had her last back surgery.    HYPERTENSION   Blood pressure range-not checking  Chest pain- no      Dyspnea- no Lightheadedness- no   Edema- no  Other side effects - no   Medication compliance: good Low salt diet- yes     DIABETES    Blood Sugar ranges-140s   Polyuria- no New Visual problems- no  Hypoglycemic symptoms- no  Other side effects-no Medication compliance - good Last eye exam- 06/2015 Foot exam- today   HYPERLIPIDEMIA  Medication compliance- good RUQ pain- no  Muscle aches- no Other side effects-no   Review of Systems  Constitutional: Negative for appetite change, diaphoresis, fatigue and unexpected weight change.  Eyes: Negative for pain, redness and visual disturbance.  Respiratory: Negative for cough, chest tightness, shortness of breath and wheezing.   Cardiovascular: Negative for chest pain, palpitations and leg swelling.  Endocrine: Negative for cold intolerance, heat intolerance, polydipsia, polyphagia and polyuria.  Genitourinary: Negative for difficulty urinating, dysuria and frequency.  Musculoskeletal: Positive for back pain.  Neurological: Positive for weakness. Negative for dizziness, light-headedness, numbness and headaches.    History Past Medical History:  Diagnosis Date  . Allergy   . Arthritis   . Asthma   . Borderline glaucoma   . Cataract   . Diabetes ([place])   . GERD (gastroesophageal reflux disease)   . Hypertension   . Macular degeneration 12/25/2011  . Osteopenia   . Thyroid disease    Hypothyroidism    She has a past surgical history that includes Tonsillectomy (1963); Knee arthroscopy (Right, 2002); Lumbar laminectomy  (1999); Rotator cuff repair (Right, 02/27/2005); Abdominal hysterectomy (1986); and Back surgery (2016).   Her family history includes Cancer in her father; Cancer (age of onset: 96) in her daughter; Cervical cancer in her daughter; Dementia in her sister; Diabetes in her brother, father, mother, sister, and sister; Hyperlipidemia in her brother, sister, and sister; Hypertension in her brother, father, mother, sister, and sister; Kidney disease in her mother.She reports that she has never smoked. She has never used smokeless tobacco. She reports that she does not drink alcohol or use drugs.  Current Outpatient Prescriptions on File Prior to Visit  Medication Sig Dispense Refill  . alendronate (FOSAMAX) 70 MG tablet Take 1 tablet (70 mg total) by mouth every 7 (seven) days. Take with a full glass of water on an empty stomach. 4 tablet 11  . aspirin EC 81 MG tablet Take 1 tablet (81 mg total) by mouth daily.    . Biotin 1000 MCG tablet Take 5,000 mcg by mouth 2 (two) times daily.     . Calcium-Vitamin D 600-200 MG-UNIT per tablet Take 1 tablet by mouth 3 (three) times daily with meals.      . cetirizine (ZYRTEC) 10 MG tablet TAKE ONE TABLET BY MOUTH ONCE DAILY 90 tablet 3  . Cholecalciferol (VITAMIN D3) 1000 UNITS CAPS Take 1 capsule by mouth daily.      Marland Kitchen EPIPEN 2-PAK 0.3 MG/0.3ML DEVI Reported on 07/26/2015    . Garlic Oil (SUPER GARLIC) 123XX123 MG CAPS Take 1 capsule by mouth daily.     Marland Kitchen  L-Lysine 500 MG TABS Take 1 tablet by mouth daily.      . Lancets (FREESTYLE) lancets CHECK BLOOD SUGAR TWICE DAILY 200 each 0  . Magnesium 250 MG TABS Take 1 tablet by mouth daily.     . Omega-3 Fatty Acids (OMEGA 3 PO) Take 2 tablets by mouth daily.     Marland Kitchen omeprazole (PRILOSEC) 40 MG capsule Take 1 capsule (40 mg total) by mouth daily. 90 capsule 3  . ONE TOUCH ULTRA TEST test strip CHECK BLOOD SUGAR TWO TIMES DAILY 200 each 11  . OVER THE COUNTER MEDICATION Maxi-vision 2 capsules per day    . SIMPLY SALINE NA  Place 1 spray into the nose daily.      . Turmeric 500 MG CAPS Take 1 capsule by mouth 2 (two) times daily.    . vitamin C (ASCORBIC ACID) 500 MG tablet Take 1,000 mg by mouth daily.     No current facility-administered medications on file prior to visit.      Objective:  Objective  Physical Exam  Constitutional: She is oriented to person, place, and time. She appears well-developed and well-nourished.  HENT:  Head: Normocephalic and atraumatic.  Eyes: Conjunctivae and EOM are normal.  Neck: Normal range of motion. Neck supple. No JVD present. Carotid bruit is not present. No thyromegaly present.  Cardiovascular: Normal rate, regular rhythm and normal heart sounds.   No murmur heard. Pulmonary/Chest: Effort normal and breath sounds normal. No respiratory distress. She has no wheezes. She has no rales. She exhibits no tenderness.  Musculoskeletal: She exhibits tenderness. She exhibits no edema.  Neurological: She is alert and oriented to person, place, and time.  Dec dtr b/l Slight weakness in L hip flexor and L toe   Psychiatric: She has a normal mood and affect.  Sensory exam of the foot is normal, tested with the monofilament. Good pulses, no lesions or ulcers, good peripheral pulses.  BP (!) 138/40 (BP Location: Right Arm, Patient Position: Sitting, Cuff Size: Large)   Pulse 84   Temp 98.1 F (36.7 C) (Oral)   Resp 16   Ht 5\' 1"  (1.549 m)   Wt 139 lb 6.4 oz (63.2 kg)   SpO2 98%   BMI 26.34 kg/m  Wt Readings from Last 3 Encounters:  11/28/15 139 lb 6.4 oz (63.2 kg)  08/09/15 145 lb (65.8 kg)  07/26/15 145 lb 6.4 oz (66 kg)     Lab Results  Component Value Date   WBC 7.9 06/05/2015   HGB 13.4 06/05/2015   HCT 40.5 06/05/2015   PLT 240.0 06/05/2015   GLUCOSE 138 (H) 06/05/2015   CHOL 128 06/05/2015   TRIG 89.0 06/05/2015   HDL 48.60 06/05/2015   LDLCALC 62 06/05/2015   ALT 22 06/05/2015   AST 19 06/05/2015   NA 139 06/05/2015   K 3.8 06/05/2015   CL 101  06/05/2015   CREATININE 0.75 06/05/2015   BUN 18 06/05/2015   CO2 27 06/05/2015   TSH 0.85 05/10/2015   HGBA1C 7.0 (H) 06/05/2015   MICROALBUR 2.1 (H) 06/05/2015    Mr Lumbar Spine W Wo Contrast  Result Date: 07/23/2014 CLINICAL DATA:  Left hip pain since exercising 5 months ago. Remote back surgery. Initial encounter. EXAM: MRI LUMBAR SPINE WITHOUT AND WITH CONTRAST TECHNIQUE: Multiplanar and multiecho pulse sequences of the lumbar spine were obtained without and with intravenous contrast. CONTRAST:  13 ml MultiHance. COMPARISON:  None. FINDINGS: Five lumbar type vertebral bodies are assumed. There  is a mild convex left scoliosis. There is advanced disc space loss and endplate degeneration asymmetric to the left at L5-S1. There is a prominent hemangioma within the left aspect of the L4 vertebral body. No evidence of acute fracture or pars defect. The conus medullaris extends to the T12-L1 level and appears normal. No abnormal intradural enhancement or paraspinal abnormality demonstrated.A cyst projecting from the upper pole of the right kidney is partially imaged on the sagittal images. No significant disc space findings from T11-12 through L2-3. L3-4: Mild disc bulging with an extraforaminal disc protrusion on the left causing possible extraforaminal left L3 nerve root encroachment. There is mild facet and ligamentous hypertrophy. The lateral recesses and foramina are sufficiently patent. L4-5: Disc degeneration with loss of height and a broad-based disc protrusion on the left involving the subarticular zone and foramen. This does not appear to causes left L4 nerve root encroachment, although mildly displaces the left L5 nerve root posteriorly in the lateral recess. There is mild facet and ligamentous hypertrophy contributing to mild triangulation of the thecal sac. No right-sided nerve root encroachment identified. L5-S1: Left laminectomy defect noted with chronic disc degeneration and posterior  osteophytes. Mild facet hypertrophy, greater on the left. There is mild fibrosis surrounding the left S1 nerve root. There is mild inferior foraminal narrowing on the left without definite L5 nerve root encroachment. IMPRESSION: 1. Small extraforaminal disc protrusion on the left at L3-4 may encroach on the extraforaminal portion of the left L3 nerve root. 2. Broad-based disc protrusion on the left involving the subarticular zone and foramen at L4-5 causing probable left L5 nerve root encroachment in the lateral recess. This does not cause significant foraminal narrowing or definite L4 nerve root encroachment. 3. Postsurgical changes on the left at L5-S1 with epidural fibrosis surrounding the left S1 nerve root sleeve and mild left foraminal narrowing, but no definite nerve root encroachment. Electronically Signed   By: Richardean Sale M.D.   On: 07/23/2014 15:32     Assessment & Plan:  Plan  I am having Ms. Roza maintain her Calcium-Vitamin D, L-Lysine, Magnesium, Omega-3 Fatty Acids (OMEGA 3 PO), Garlic Oil, Vitamin D3, SIMPLY SALINE NA, EPIPEN 2-PAK, Biotin, OVER THE COUNTER MEDICATION, Turmeric, vitamin C, aspirin EC, omeprazole, alendronate, ONE TOUCH ULTRA TEST, freestyle, cetirizine, HYDROcodone-acetaminophen, pravastatin, metFORMIN, levothyroxine, and amLODipine.  Meds ordered this encounter  Medications  . DISCONTD: HYDROcodone-acetaminophen (NORCO/VICODIN) 5-325 MG tablet    Sig: Take 1 tablet by mouth every 6 (six) hours as needed for moderate pain.    Dispense:  20 tablet    Refill:  0  . HYDROcodone-acetaminophen (NORCO/VICODIN) 5-325 MG tablet    Sig: Take 1 tablet by mouth every 6 (six) hours as needed for moderate pain.    Dispense:  20 tablet    Refill:  0  . pravastatin (PRAVACHOL) 10 MG tablet    Sig: Take 1 tablet (10 mg total) by mouth daily.    Dispense:  90 tablet    Refill:  0  . metFORMIN (GLUCOPHAGE) 1000 MG tablet    Sig: Take 1 tablet (1,000 mg total) by mouth 2  (two) times daily with a meal.    Dispense:  180 tablet    Refill:  1  . levothyroxine (SYNTHROID, LEVOTHROID) 100 MCG tablet    Sig: Take 1 tablet (100 mcg total) by mouth daily.    Dispense:  90 tablet    Refill:  3  . amLODipine (NORVASC) 5 MG tablet  Sig: Take 1 tablet (5 mg total) by mouth daily.    Dispense:  90 tablet    Refill:  1    Problem List Items Addressed This Visit      Unprioritized   DM (diabetes mellitus) type II uncontrolled, periph vascular disorder ([place])    Check labs con't meds      Relevant Medications   pravastatin (PRAVACHOL) 10 MG tablet   metFORMIN (GLUCOPHAGE) 1000 MG tablet   amLODipine (NORVASC) 5 MG tablet   Other Relevant Orders   Comprehensive metabolic panel   Lipid panel   Hemoglobin A1c   POCT urinalysis dipstick   Microalbumin / creatinine urine ratio   Essential hypertension    Stable con't meds      Relevant Medications   pravastatin (PRAVACHOL) 10 MG tablet   amLODipine (NORVASC) 5 MG tablet   Other Relevant Orders   Comprehensive metabolic panel   Lipid panel   Hemoglobin A1c   CBC with Differential/Platelet   POCT urinalysis dipstick   Microalbumin / creatinine urine ratio    Other Visit Diagnoses    Low back pain with radiation, left    -  Primary   Relevant Medications   HYDROcodone-acetaminophen (NORCO/VICODIN) 5-325 MG tablet   Other Relevant Orders   MR Lumbar Spine W Wo Contrast   Hyperlipidemia LDL goal <70       Relevant Medications   pravastatin (PRAVACHOL) 10 MG tablet   amLODipine (NORVASC) 5 MG tablet   Other Relevant Orders   Comprehensive metabolic panel   Lipid panel   Hypothyroidism, adult       Relevant Medications   levothyroxine (SYNTHROID, LEVOTHROID) 100 MCG tablet   Other Relevant Orders   TSH   DM (diabetes mellitus) type II controlled peripheral vascular disorder ([facility])       Relevant Medications   pravastatin (PRAVACHOL) 10 MG tablet   metFORMIN (GLUCOPHAGE) 1000 MG tablet    amLODipine (NORVASC) 5 MG tablet   Hypothyroidism, unspecified hypothyroidism type       Relevant Medications   levothyroxine (SYNTHROID, LEVOTHROID) 100 MCG tablet      Follow-up: Return in about 6 months (around 05/30/2016) for hypertension, hyperlipidemia, diabetes II.  Ann Held, DO

## 2015-11-28 NOTE — Patient Instructions (Signed)
Back Pain, Adult Back pain is very common in adults.The cause of back pain is rarely dangerous and the pain often gets better over time.The cause of your back pain may not be known. Some common causes of back pain include:  Strain of the muscles or ligaments supporting the spine.  Wear and tear (degeneration) of the spinal disks.  Arthritis.  Direct injury to the back. For many people, back pain may return. Since back pain is rarely dangerous, most people can learn to manage this condition on their own. HOME CARE INSTRUCTIONS Watch your back pain for any changes. The following actions may help to lessen any discomfort you are feeling:  Remain active. It is stressful on your back to sit or stand in one place for long periods of time. Do not sit, drive, or stand in one place for more than 30 minutes at a time. Take short walks on even surfaces as soon as you are able.Try to increase the length of time you walk each day.  Exercise regularly as directed by your health care provider. Exercise helps your back heal faster. It also helps avoid future injury by keeping your muscles strong and flexible.  Do not stay in bed.Resting more than 1-2 days can delay your recovery.  Pay attention to your body when you bend and lift. The most comfortable positions are those that put less stress on your recovering back. Always use proper lifting techniques, including:  Bending your knees.  Keeping the load close to your body.  Avoiding twisting.  Find a comfortable position to sleep. Use a firm mattress and lie on your side with your knees slightly bent. If you lie on your back, put a pillow under your knees.  Avoid feeling anxious or stressed.Stress increases muscle tension and can worsen back pain.It is important to recognize when you are anxious or stressed and learn ways to manage it, such as with exercise.  Take medicines only as directed by your health care provider. Over-the-counter  medicines to reduce pain and inflammation are often the most helpful.Your health care provider may prescribe muscle relaxant drugs.These medicines help dull your pain so you can more quickly return to your normal activities and healthy exercise.  Apply ice to the injured area:  Put ice in a plastic bag.  Place a towel between your skin and the bag.  Leave the ice on for 20 minutes, 2-3 times a day for the first 2-3 days. After that, ice and heat may be alternated to reduce pain and spasms.  Maintain a healthy weight. Excess weight puts extra stress on your back and makes it difficult to maintain good posture. SEEK MEDICAL CARE IF:  You have pain that is not relieved with rest or medicine.  You have increasing pain going down into the legs or buttocks.  You have pain that does not improve in one week.  You have night pain.  You lose weight.  You have a fever or chills. SEEK IMMEDIATE MEDICAL CARE IF:   You develop new bowel or bladder control problems.  You have unusual weakness or numbness in your arms or legs.  You develop nausea or vomiting.  You develop abdominal pain.  You feel faint.   This information is not intended to replace advice given to you by your health care provider. Make sure you discuss any questions you have  Basic Carbohydrate Counting for Diabetes Mellitus Carbohydrate counting is a method for keeping track of the amount of carbohydrates you eat.  Eating carbohydrates naturally increases the level of sugar (glucose) in your blood, so it is important for you to know the amount that is okay for you to have in every meal. Carbohydrate counting helps keep the level of glucose in your blood within normal limits. The amount of carbohydrates allowed is different for every person. A dietitian can help you calculate the amount that is right for you. Once you know the amount of carbohydrates you can have, you can count the carbohydrates in the foods you want to  eat. Carbohydrates are found in the following foods: Grains, such as breads and cereals. Dried beans and soy products. Starchy vegetables, such as potatoes, peas, and corn. Fruit and fruit juices. Milk and yogurt. Sweets and snack foods, such as cake, cookies, candy, chips, soft drinks, and fruit drinks. CARBOHYDRATE COUNTING There are two ways to count the carbohydrates in your food. You can use either of the methods or a combination of both. Reading the "Nutrition Facts" on [place] The "Nutrition Facts" is an area that is included on the labels of almost all packaged food and beverages in the [place]. It includes the serving size of that food or beverage and information about the nutrients in each serving of the food, including the grams (g) of carbohydrate per serving.  Decide the number of servings of this food or beverage that you will be able to eat or drink. Multiply that number of servings by the number of grams of carbohydrate that is listed on the label for that serving. The total will be the amount of carbohydrates you will be having when you eat or drink this food or beverage. Learning Standard Serving Sizes of Food When you eat food that is not packaged or does not include "Nutrition Facts" on the label, you need to measure the servings in order to count the amount of carbohydrates.A serving of most carbohydrate-rich foods contains about 15 g of carbohydrates. The following list includes serving sizes of carbohydrate-rich foods that provide 15 g ofcarbohydrate per serving:  1 slice of bread (1 oz) or 1 six-inch tortilla.   of a hamburger bun or English muffin. 4-6 crackers.  cup unsweetened dry cereal.   cup hot cereal.  cup rice or pasta.   cup mashed potatoes or  of a large baked potato. 1 cup fresh fruit or one small piece of fruit.   cup canned or frozen fruit or fruit juice. 1 cup milk.  cup plain fat-free yogurt or yogurt sweetened with artificial  sweeteners.  cup cooked dried beans or starchy vegetable, such as peas, corn, or potatoes.  Decide the number of standard-size servings that you will eat. Multiply that number of servings by 15 (the grams of carbohydrates in that serving). For example, if you eat 2 cups of strawberries, you will have eaten 2 servings and 30 g of carbohydrates (2 servings x 15 g = 30 g). For foods such as soups and casseroles, in which more than one food is mixed in, you will need to count the carbohydrates in each food that is included. EXAMPLE OF CARBOHYDRATE COUNTING Sample Dinner 3 oz chicken breast.  cup of brown rice.  cup of corn. 1 cup milk.  1 cup strawberries with sugar-free whipped topping.  Carbohydrate Calculation Step 1: Identify the foods that contain carbohydrates:  Rice.  Corn.  Milk.  Strawberries. Step 2:Calculate the number of servings eaten of each:  2 servings of rice.  1 serving of corn.  1  serving of milk.  1 serving of strawberries. Step 3: Multiply each of those number of servings by 15 g:  2 servings of rice x 15 g = 30 g.  1 serving of corn x 15 g = 15 g.  1 serving of milk x 15 g = 15 g.  1 serving of strawberries x 15 g = 15 g. Step 4: Add together all of the amounts to find the total grams of carbohydrates eaten: 30 g + 15 g + 15 g + 15 g = 75 g.   This information is not intended to replace advice given to you by your health care provider. Make sure you discuss any questions you have with your health care provider.   Document Released: 04/08/2005 Document Revised: 04/29/2014 Document Reviewed: 03/05/2013 Elsevier Interactive Patient Education [employer].  with your health care provider.   Document Released: 04/08/2005 Document Revised: 04/29/2014 Document Reviewed: 08/10/2013 Elsevier Interactive Patient Education [employer].

## 2015-11-28 NOTE — Assessment & Plan Note (Signed)
Check labs con't meds 

## 2015-11-28 NOTE — Progress Notes (Signed)
Pre visit review using our clinic review tool, if applicable. No additional management support is needed unless otherwise documented below in the visit note. 

## 2015-11-29 LAB — MICROALBUMIN / CREATININE URINE RATIO
CREATININE, U: 16.5 mg/dL
MICROALB UR: 1.1 mg/dL (ref 0.0–1.9)
MICROALB/CREAT RATIO: 6.7 mg/g (ref 0.0–30.0)

## 2015-12-02 ENCOUNTER — Ambulatory Visit (HOSPITAL_BASED_OUTPATIENT_CLINIC_OR_DEPARTMENT_OTHER)
Admission: RE | Admit: 2015-12-02 | Discharge: 2015-12-02 | Disposition: A | Discharge status: Home / Self Care | Payer: Medicare Other | Source: Ambulatory Visit | Attending: Family Medicine | Admitting: Family Medicine

## 2015-12-02 ENCOUNTER — Other Ambulatory Visit: Payer: Self-pay | Admitting: Family Medicine

## 2015-12-02 DIAGNOSIS — M5442 Lumbago with sciatica, left side: Secondary | ICD-10-CM

## 2015-12-02 DIAGNOSIS — M5126 Other intervertebral disc displacement, lumbar region: Secondary | ICD-10-CM | POA: Diagnosis not present

## 2015-12-12 DIAGNOSIS — M5136 Other intervertebral disc degeneration, lumbar region: Secondary | ICD-10-CM | POA: Diagnosis not present

## 2015-12-12 DIAGNOSIS — Z9889 Other specified postprocedural states: Secondary | ICD-10-CM | POA: Diagnosis not present

## 2015-12-12 DIAGNOSIS — M4726 Other spondylosis with radiculopathy, lumbar region: Secondary | ICD-10-CM | POA: Diagnosis not present

## 2015-12-12 DIAGNOSIS — M5126 Other intervertebral disc displacement, lumbar region: Secondary | ICD-10-CM | POA: Diagnosis not present

## 2015-12-12 DIAGNOSIS — M546 Pain in thoracic spine: Secondary | ICD-10-CM | POA: Diagnosis not present

## 2015-12-14 DIAGNOSIS — M5136 Other intervertebral disc degeneration, lumbar region: Secondary | ICD-10-CM | POA: Diagnosis not present

## 2015-12-14 DIAGNOSIS — M4726 Other spondylosis with radiculopathy, lumbar region: Secondary | ICD-10-CM | POA: Diagnosis not present

## 2015-12-14 DIAGNOSIS — M5116 Intervertebral disc disorders with radiculopathy, lumbar region: Secondary | ICD-10-CM | POA: Diagnosis not present

## 2015-12-14 DIAGNOSIS — M5126 Other intervertebral disc displacement, lumbar region: Secondary | ICD-10-CM | POA: Diagnosis not present

## 2015-12-26 ENCOUNTER — Other Ambulatory Visit: Payer: Self-pay | Admitting: Family Medicine

## 2015-12-26 DIAGNOSIS — J3089 Other allergic rhinitis: Secondary | ICD-10-CM | POA: Diagnosis not present

## 2015-12-26 DIAGNOSIS — J3081 Allergic rhinitis due to animal (cat) (dog) hair and dander: Secondary | ICD-10-CM | POA: Diagnosis not present

## 2015-12-26 DIAGNOSIS — J301 Allergic rhinitis due to pollen: Secondary | ICD-10-CM | POA: Diagnosis not present

## 2016-01-01 ENCOUNTER — Other Ambulatory Visit: Payer: Self-pay

## 2016-01-01 MED ORDER — FREESTYLE LANCETS MISC: 0 refills | Status: DC

## 2016-01-04 DIAGNOSIS — Z23 Encounter for immunization: Secondary | ICD-10-CM | POA: Diagnosis not present

## 2016-01-08 DIAGNOSIS — E119 Type 2 diabetes mellitus without complications: Secondary | ICD-10-CM | POA: Diagnosis not present

## 2016-01-08 LAB — HM DIABETES EYE EXAM

## 2016-02-08 ENCOUNTER — Encounter: Payer: Self-pay | Admitting: Family Medicine

## 2016-02-19 DIAGNOSIS — Z1231 Encounter for screening mammogram for malignant neoplasm of breast: Secondary | ICD-10-CM | POA: Diagnosis not present

## 2016-02-19 LAB — HM MAMMOGRAPHY: HM Mammogram: NORMAL (ref 0–4)

## 2016-02-22 DIAGNOSIS — J3089 Other allergic rhinitis: Secondary | ICD-10-CM | POA: Diagnosis not present

## 2016-02-22 DIAGNOSIS — J3081 Allergic rhinitis due to animal (cat) (dog) hair and dander: Secondary | ICD-10-CM | POA: Diagnosis not present

## 2016-02-26 DIAGNOSIS — J3089 Other allergic rhinitis: Secondary | ICD-10-CM | POA: Diagnosis not present

## 2016-02-26 DIAGNOSIS — J301 Allergic rhinitis due to pollen: Secondary | ICD-10-CM | POA: Diagnosis not present

## 2016-02-27 ENCOUNTER — Other Ambulatory Visit: Payer: Self-pay | Admitting: Family Medicine

## 2016-03-04 DIAGNOSIS — J3089 Other allergic rhinitis: Secondary | ICD-10-CM | POA: Diagnosis not present

## 2016-03-04 DIAGNOSIS — J301 Allergic rhinitis due to pollen: Secondary | ICD-10-CM | POA: Diagnosis not present

## 2016-03-04 DIAGNOSIS — J4520 Mild intermittent asthma, uncomplicated: Secondary | ICD-10-CM | POA: Diagnosis not present

## 2016-03-04 DIAGNOSIS — J3081 Allergic rhinitis due to animal (cat) (dog) hair and dander: Secondary | ICD-10-CM | POA: Diagnosis not present

## 2016-03-22 DIAGNOSIS — J3081 Allergic rhinitis due to animal (cat) (dog) hair and dander: Secondary | ICD-10-CM | POA: Diagnosis not present

## 2016-03-22 DIAGNOSIS — J301 Allergic rhinitis due to pollen: Secondary | ICD-10-CM | POA: Diagnosis not present

## 2016-03-22 DIAGNOSIS — J3089 Other allergic rhinitis: Secondary | ICD-10-CM | POA: Diagnosis not present

## 2016-03-25 DIAGNOSIS — J301 Allergic rhinitis due to pollen: Secondary | ICD-10-CM | POA: Diagnosis not present

## 2016-03-25 DIAGNOSIS — J3089 Other allergic rhinitis: Secondary | ICD-10-CM | POA: Diagnosis not present

## 2016-03-26 ENCOUNTER — Other Ambulatory Visit: Payer: Self-pay | Admitting: Family Medicine

## 2016-03-26 DIAGNOSIS — K219 Gastro-esophageal reflux disease without esophagitis: Secondary | ICD-10-CM

## 2016-03-26 NOTE — Telephone Encounter (Signed)
Has appointment February

## 2016-03-28 DIAGNOSIS — J301 Allergic rhinitis due to pollen: Secondary | ICD-10-CM | POA: Diagnosis not present

## 2016-03-28 DIAGNOSIS — J3089 Other allergic rhinitis: Secondary | ICD-10-CM | POA: Diagnosis not present

## 2016-03-28 DIAGNOSIS — J3081 Allergic rhinitis due to animal (cat) (dog) hair and dander: Secondary | ICD-10-CM | POA: Diagnosis not present

## 2016-04-01 ENCOUNTER — Other Ambulatory Visit: Payer: Self-pay | Admitting: *Deleted

## 2016-04-01 DIAGNOSIS — J3089 Other allergic rhinitis: Secondary | ICD-10-CM | POA: Diagnosis not present

## 2016-04-01 DIAGNOSIS — J301 Allergic rhinitis due to pollen: Secondary | ICD-10-CM | POA: Diagnosis not present

## 2016-04-01 MED ORDER — FREESTYLE LANCETS MISC: 1 refills | Status: DC

## 2016-04-03 DIAGNOSIS — J3089 Other allergic rhinitis: Secondary | ICD-10-CM | POA: Diagnosis not present

## 2016-04-03 DIAGNOSIS — J301 Allergic rhinitis due to pollen: Secondary | ICD-10-CM | POA: Diagnosis not present

## 2016-04-07 IMAGING — MR MR LUMBAR SPINE WO/W CM
4 of 7 series · 26 of 48 positions shown · IV contrast (MULTIHANCE)
Comparison: None.

CLINICAL DATA: Left hip pain since exercising 5 months ago. Remote
back surgery. Initial encounter.

EXAM:
MRI LUMBAR SPINE WITHOUT AND WITH CONTRAST
TECHNIQUE: Multiplanar and multiecho pulse sequences of the lumbar spine were
obtained without and with intravenous contrast.
CONTRAST:  13 ml MultiHance.

[Series 2: T1 · sagittal · 4.0mm · 0.51mm/px · 5 of 14 slices shown (1 of 2)]
[im 1/14]
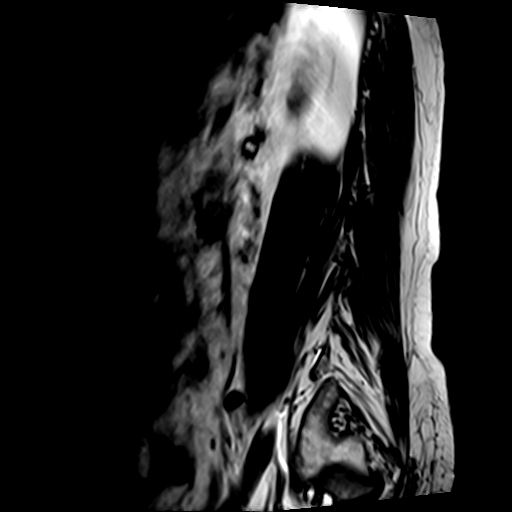
[im 4/14]
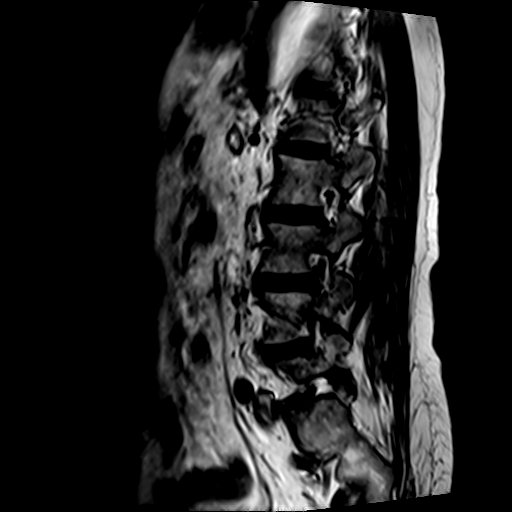
[im 7/14]
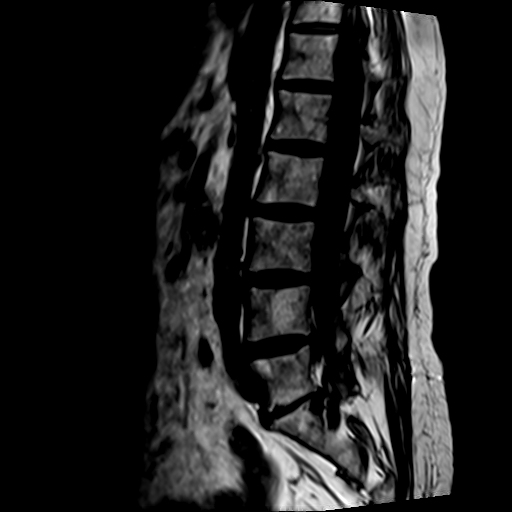
[im 10/14]
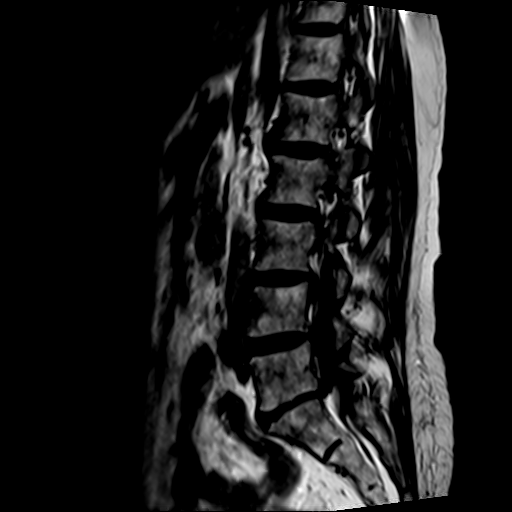
[im 14/14]
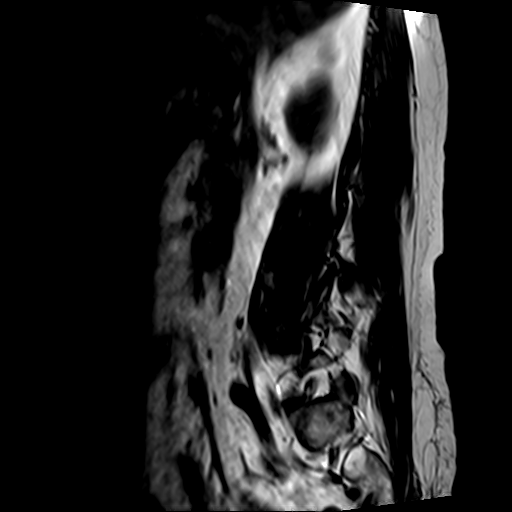

[Series 3: T2 · sagittal · 4.0mm · 0.81mm/px · 5 of 14 slices shown (1 of 2)]
[im 1/14]
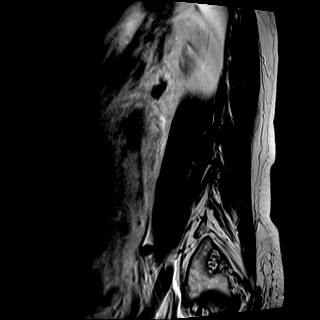
[im 4/14]
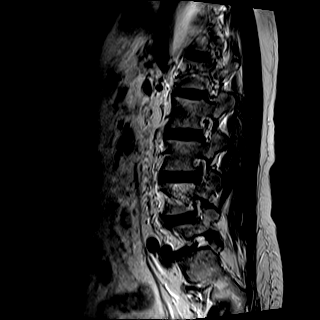
[im 7/14]
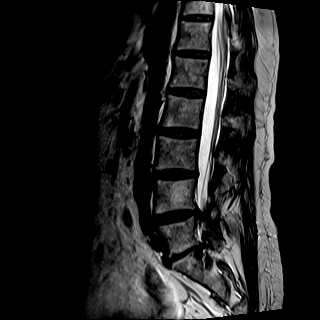
[im 10/14]
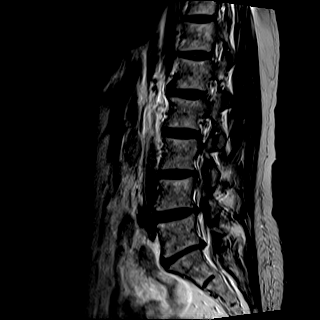
[im 14/14]
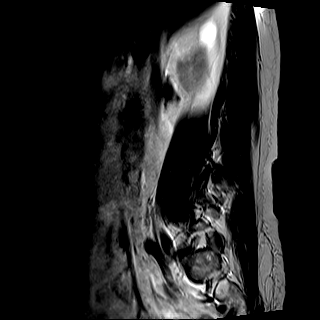

[Series 5: T2 · axial · 4.0mm · 0.39mm/px · z∈[-79,+97]mm · 8 of 34 slices shown (2 of 2)]
[im 1/34]
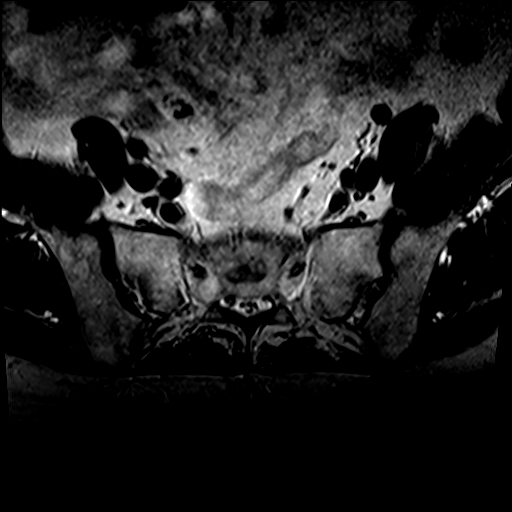
[im 4/34]
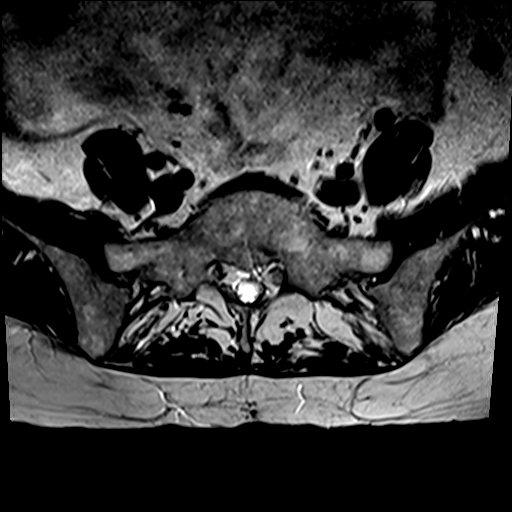
[im 12/34]
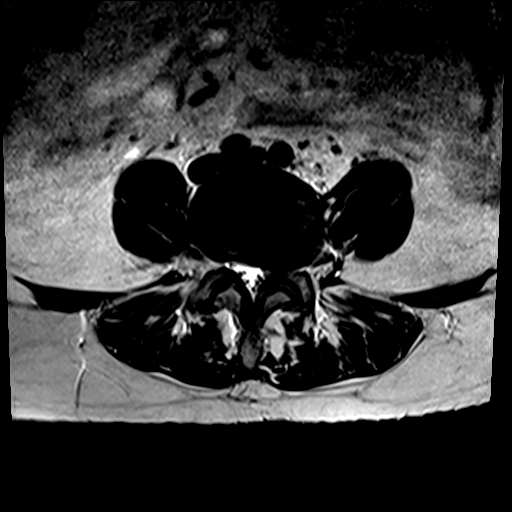
[im 15/34]
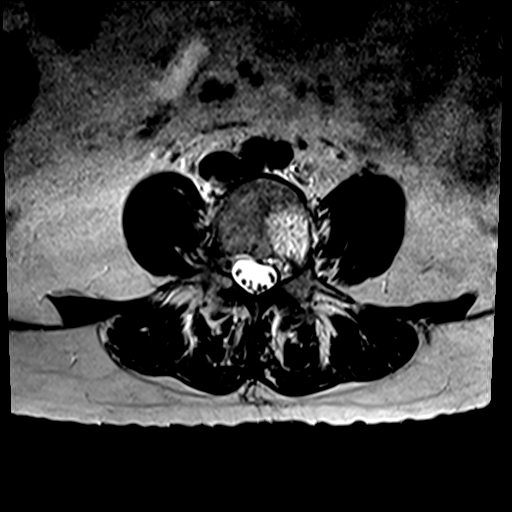
[im 19/34]
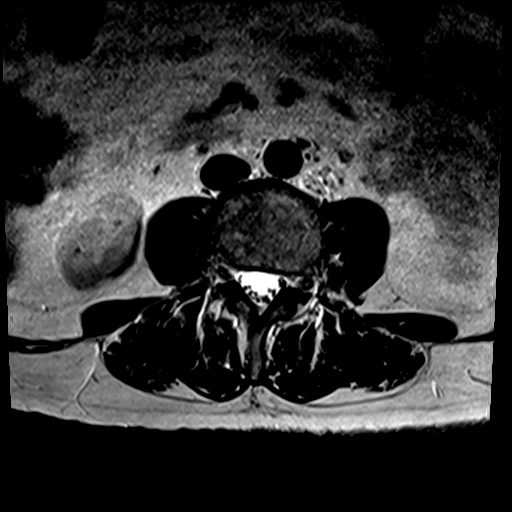
[im 23/34]
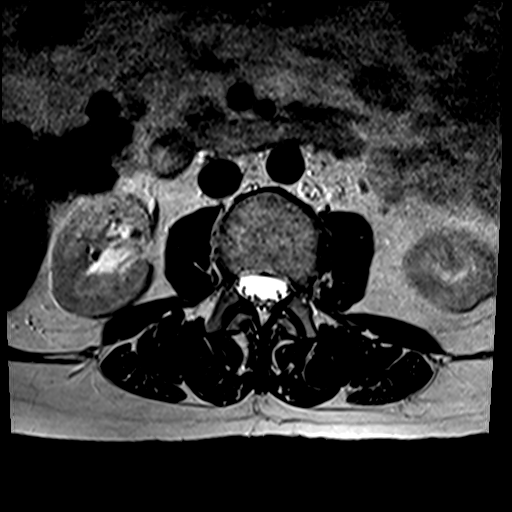
[im 30/34]
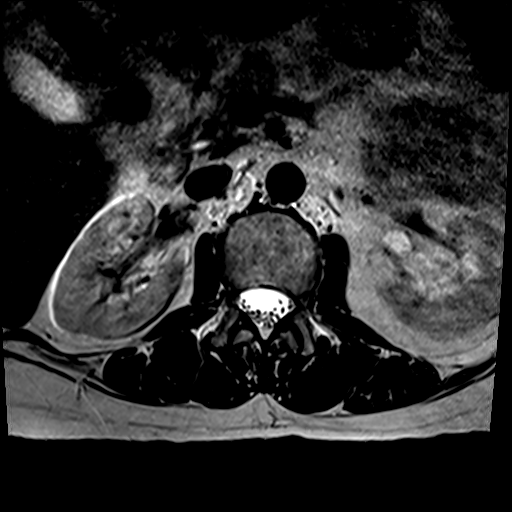
[im 34/34]
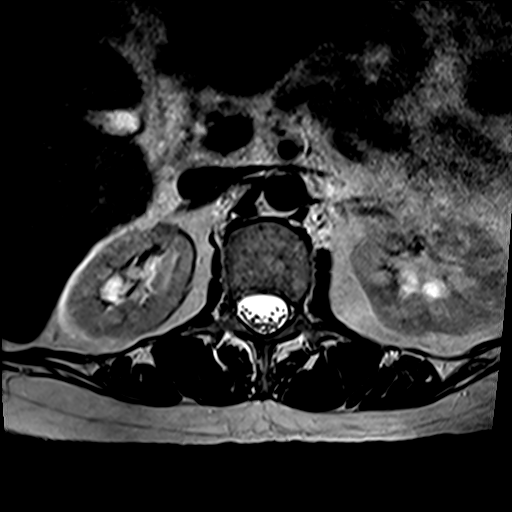

[Series 6: T1 · axial · 4.0mm · 0.78mm/px · z∈[-79,+97]mm · 8 of 34 slices shown (2 of 2)]
[im 1/34]
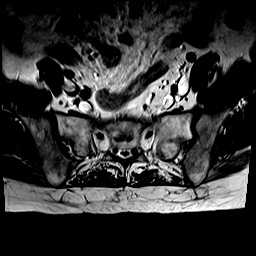
[im 4/34]
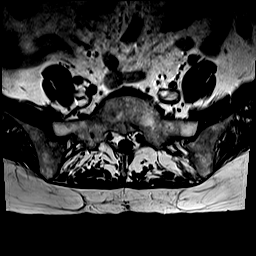
[im 12/34]
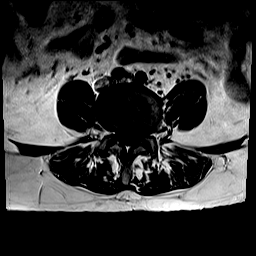
[im 15/34]
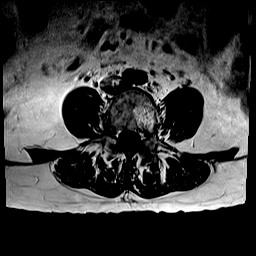
[im 19/34]
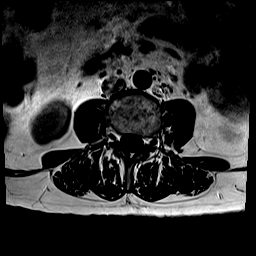
[im 23/34]
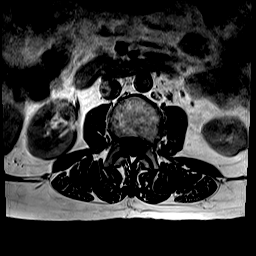
[im 30/34]
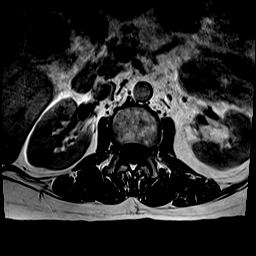
[im 34/34]
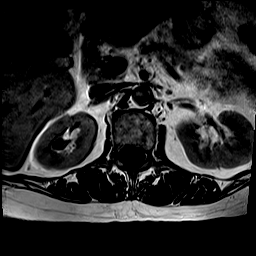

[26 of 48 positions shown; findings below may reference images not displayed]

FINDINGS: Five lumbar type vertebral bodies are assumed. There is a mild
convex left scoliosis. There is advanced disc space loss and
endplate degeneration asymmetric to the left at L5-S1. There is a
prominent hemangioma within the left aspect of the L4 vertebral
body. No evidence of acute fracture or pars defect.

The conus medullaris extends to the T12-L1 level and appears normal.
No abnormal intradural enhancement or paraspinal abnormality
demonstrated.A cyst projecting from the upper pole of the right
kidney is partially imaged on the sagittal images.

No significant disc space findings from T11-12 through L2-3.

L3-4: Mild disc bulging with an extraforaminal disc protrusion on
the left causing possible extraforaminal left L3 nerve root
encroachment. There is mild facet and ligamentous hypertrophy. The
lateral recesses and foramina are sufficiently patent.

L4-5: Disc degeneration with loss of height and a broad-based disc
protrusion on the left involving the subarticular zone and foramen.
This does not appear to causes left L4 nerve root encroachment,
although mildly displaces the left L5 nerve root posteriorly in the
lateral recess. There is mild facet and ligamentous hypertrophy
contributing to mild triangulation of the thecal sac. No right-sided
nerve root encroachment identified.

L5-S1: Left laminectomy defect noted with chronic disc degeneration
and posterior osteophytes. Mild facet hypertrophy, greater on the
left. There is mild fibrosis surrounding the left S1 nerve root.
There is mild inferior foraminal narrowing on the left without
definite L5 nerve root encroachment.
IMPRESSION: 1. Small extraforaminal disc protrusion on the left at L[DATE]
encroach on the extraforaminal portion of the left L3 nerve root.
2. Broad-based disc protrusion on the left involving the
subarticular zone and foramen at L4-5 causing probable left L5 nerve
root encroachment in the lateral recess. This does not cause
significant foraminal narrowing or definite L4 nerve root
encroachment.
3. Postsurgical changes on the left at L5-S1 with epidural fibrosis
surrounding the left S1 nerve root sleeve and mild left foraminal
narrowing, but no definite nerve root encroachment.

## 2016-04-26 ENCOUNTER — Other Ambulatory Visit: Payer: Self-pay | Admitting: Family Medicine

## 2016-04-26 DIAGNOSIS — I10 Essential (primary) hypertension: Secondary | ICD-10-CM

## 2016-04-26 DIAGNOSIS — E1151 Type 2 diabetes mellitus with diabetic peripheral angiopathy without gangrene: Secondary | ICD-10-CM

## 2016-05-07 DIAGNOSIS — J3081 Allergic rhinitis due to animal (cat) (dog) hair and dander: Secondary | ICD-10-CM | POA: Diagnosis not present

## 2016-05-07 DIAGNOSIS — J301 Allergic rhinitis due to pollen: Secondary | ICD-10-CM | POA: Diagnosis not present

## 2016-05-07 DIAGNOSIS — J3089 Other allergic rhinitis: Secondary | ICD-10-CM | POA: Diagnosis not present

## 2016-05-28 ENCOUNTER — Encounter: Payer: Self-pay | Admitting: Family Medicine

## 2016-05-28 ENCOUNTER — Ambulatory Visit (INDEPENDENT_AMBULATORY_CARE_PROVIDER_SITE_OTHER): Payer: Medicare Other | Admitting: Family Medicine

## 2016-05-28 VITALS — BP 154/84 | HR 99 | Temp 98.2°F | Resp 16 | Ht 61.8 in | Wt 144.0 lb

## 2016-05-28 DIAGNOSIS — I10 Essential (primary) hypertension: Secondary | ICD-10-CM

## 2016-05-28 DIAGNOSIS — E039 Hypothyroidism, unspecified: Secondary | ICD-10-CM

## 2016-05-28 DIAGNOSIS — L659 Nonscarring hair loss, unspecified: Secondary | ICD-10-CM

## 2016-05-28 DIAGNOSIS — IMO0002 Reserved for concepts with insufficient information to code with codable children: Secondary | ICD-10-CM

## 2016-05-28 DIAGNOSIS — E785 Hyperlipidemia, unspecified: Secondary | ICD-10-CM

## 2016-05-28 DIAGNOSIS — E1151 Type 2 diabetes mellitus with diabetic peripheral angiopathy without gangrene: Secondary | ICD-10-CM | POA: Diagnosis not present

## 2016-05-28 DIAGNOSIS — E1165 Type 2 diabetes mellitus with hyperglycemia: Secondary | ICD-10-CM | POA: Diagnosis not present

## 2016-05-28 LAB — COMPREHENSIVE METABOLIC PANEL
ALBUMIN: 4.9 g/dL (ref 3.5–5.2)
ALK PHOS: 29 U/L — AB (ref 39–117)
ALT: 26 U/L (ref 0–35)
AST: 21 U/L (ref 0–37)
BILIRUBIN TOTAL: 0.5 mg/dL (ref 0.2–1.2)
BUN: 18 mg/dL (ref 6–23)
CALCIUM: 10.1 mg/dL (ref 8.4–10.5)
CHLORIDE: 100 meq/L (ref 96–112)
CO2: 27 mEq/L (ref 19–32)
CREATININE: 0.84 mg/dL (ref 0.40–1.20)
GFR: 70.71 mL/min (ref >60.00)
Glucose, Bld: 187 mg/dL — ABNORMAL HIGH (ref 70–99)
Potassium: 4.3 mEq/L (ref 3.5–5.1)
SODIUM: 137 meq/L (ref 135–145)
TOTAL PROTEIN: 8.2 g/dL (ref 6.0–8.3)

## 2016-05-28 LAB — LIPID PANEL
CHOLESTEROL: 135 mg/dL (ref 0–200)
HDL: 46.1 mg/dL (ref >39.00)
LDL Cholesterol: 63 mg/dL (ref 0–99)
NonHDL: 89.2
TRIGLYCERIDES: 133 mg/dL (ref 0.0–149.0)
Total CHOL/HDL Ratio: 3
VLDL: 26.6 mg/dL (ref 0.0–40.0)

## 2016-05-28 LAB — HEMOGLOBIN A1C: Hgb A1c MFr Bld: 7.4 % — ABNORMAL HIGH (ref 4.6–6.5)

## 2016-05-28 LAB — CBC WITH DIFFERENTIAL/PLATELET
BASOS ABS: 0 10*3/uL (ref 0.0–0.1)
Basophils Relative: 0.7 % (ref 0.0–3.0)
Eosinophils Absolute: 0.1 10*3/uL (ref 0.0–0.7)
Eosinophils Relative: 1.3 % (ref 0.0–5.0)
HCT: 40.4 % (ref 36.0–46.0)
HEMOGLOBIN: 13.8 g/dL (ref 12.0–15.0)
LYMPHS ABS: 3 10*3/uL (ref 0.7–4.0)
LYMPHS PCT: 50 % — AB (ref 12.0–46.0)
MCHC: 34.2 g/dL (ref 30.0–36.0)
MCV: 92.1 fl (ref 78.0–100.0)
MONOS PCT: 4.9 % (ref 3.0–12.0)
Monocytes Absolute: 0.3 10*3/uL (ref 0.1–1.0)
NEUTROS PCT: 43.1 % (ref 43.0–77.0)
Neutro Abs: 2.6 10*3/uL (ref 1.4–7.7)
Platelets: 235 10*3/uL (ref 150.0–400.0)
RBC: 4.39 Mil/uL (ref 3.87–5.11)
RDW: 13.5 % (ref 11.5–15.5)
WBC: 6 10*3/uL (ref 4.0–10.5)

## 2016-05-28 LAB — T4, FREE: Free T4: 1.39 ng/dL (ref 0.60–1.60)

## 2016-05-28 LAB — MICROALBUMIN, URINE: MICROALB UR: 4.9 mg/dL

## 2016-05-28 LAB — T3, FREE: T3, Free: 4.4 pg/mL — ABNORMAL HIGH (ref 2.3–4.2)

## 2016-05-28 LAB — TSH: TSH: 1.35 u[IU]/mL (ref 0.35–4.50)

## 2016-05-28 MED ORDER — AMLODIPINE BESYLATE 5 MG PO TABS: 5.0000 mg | ORAL_TABLET | Freq: Every day | ORAL | 1 refills | Status: DC

## 2016-05-28 MED ORDER — LEVOTHYROXINE SODIUM 100 MCG PO TABS: 100.0000 ug | ORAL_TABLET | Freq: Every day | ORAL | 3 refills | Status: DC

## 2016-05-28 MED ORDER — METFORMIN HCL 1000 MG PO TABS: 1000.0000 mg | ORAL_TABLET | Freq: Two times a day (BID) | ORAL | 1 refills | Status: DC

## 2016-05-28 MED ORDER — PRAVASTATIN SODIUM 10 MG PO TABS: 10.0000 mg | ORAL_TABLET | Freq: Every day | ORAL | 0 refills | Status: DC

## 2016-05-28 NOTE — Progress Notes (Signed)
Patient ID: Cheryl Cisneros, female   DOB: [birth year], 71 y.o.   MRN: [id number] I acted as a Education administrator for Dr. Carollee Herter.  Guerry Bruin, CMA    Subjective:    Patient ID: Cheryl Cisneros, female    DOB: [birth year], 71 y.o.   MRN: [id number]  Chief Complaint  Patient presents with  . Follow-up  . Hyperlipidemia  . Hypertension  . Diabetes  . Hypothyroidism    Hyperlipidemia  This is a chronic problem. The current episode started more than 1 year ago. Recent lipid tests were reviewed and are high. Exacerbating diseases include diabetes. Pertinent negatives include no chest pain, myalgias or shortness of breath. Current antihyperlipidemic treatment includes statins and diet change. The current treatment provides mild improvement of lipids. Risk factors for coronary artery disease include post-menopausal, a sedentary lifestyle, stress, dyslipidemia and diabetes mellitus.  Hypertension  This is a chronic problem. The current episode started more than 1 year ago. The problem is unchanged. The problem is uncontrolled. Pertinent negatives include no chest pain, headaches, malaise/fatigue, palpitations or shortness of breath. There are no associated agents to hypertension. Risk factors for coronary artery disease include sedentary lifestyle, stress and post-menopausal state. Past treatments include lifestyle changes and calcium channel blockers. The current treatment provides mild improvement. Identifiable causes of hypertension include a thyroid problem.  Diabetes  She presents for her follow-up diabetic visit. She has type 2 diabetes mellitus. No MedicAlert identification noted. The initial diagnosis of diabetes was made 10 years ago. Her disease course has been worsening. Pertinent negatives for hypoglycemia include no dizziness, headaches or nervousness/anxiousness. Pertinent negatives for diabetes include no chest pain and no weakness. There are no hypoglycemic complications. Symptoms are worsening.  Risk factors for coronary artery disease include diabetes mellitus, dyslipidemia, hypertension, post-menopausal and stress. Current diabetic treatment includes diet and oral agent (monotherapy). She is compliant with treatment all of the time. Her weight is stable. She is following a diabetic diet. She never (no exercise secondary to back surgery) participates in exercise. Her home blood glucose trend is increasing steadily. Her breakfast blood glucose is taken between 6-7 am. Her overall blood glucose range is >200 mg/dl. (Range 120-200) She does not see a podiatrist.Eye exam current: annually.   Patient is in today for follow up blood pressure, cholesterol and diabetes and thyroid. Pt c/o hair loss and wonders if it is one of her meds.   Past Medical History:  Diagnosis Date  . Allergy   . Arthritis   . Asthma   . Borderline glaucoma   . Cataract   . Diabetes ([place])   . GERD (gastroesophageal reflux disease)   . Hypertension   . Macular degeneration 12/25/2011  . Osteopenia   . Thyroid disease    Hypothyroidism    Past Surgical History:  Procedure Laterality Date  . ABDOMINAL HYSTERECTOMY  1986   TAH BSO  . BACK SURGERY  2016  . KNEE ARTHROSCOPY Right 2002  . LUMBAR LAMINECTOMY  1999  . ROTATOR CUFF REPAIR Right 02/27/2005  . TONSILLECTOMY  1963    Family History  Problem Relation Age of Onset  . Diabetes Mother   . Hypertension Mother   . Kidney disease Mother     dialysis  . Diabetes Father   . Hypertension Father   . Cancer Father     lung  . Dementia Sister   . Diabetes Sister   . Hyperlipidemia Sister   . Hypertension Sister   . Diabetes Brother   .  Hyperlipidemia Brother   . Hypertension Brother   . Diabetes Sister   . Hypertension Sister   . Hyperlipidemia Sister   . Other Sister     blood disorder  . Cervical cancer Daughter   . Cancer Daughter 63    cervical  . Coronary artery disease    . Lung cancer    . Colon cancer Neg Hx     Social History    Social History  . Marital status: Married    Spouse name: N/A  . Number of children: N/A  . Years of education: N/A   Occupational History  . housewife Unemployed   Social History Main Topics  . Smoking status: Never Smoker  . Smokeless tobacco: Never Used  . Alcohol use No  . Drug use: No  . Sexual activity: Yes    Partners: Male   Other Topics Concern  . Not on file   Social History Narrative   Exercise--  [employer] 3 days a week and walking    Outpatient Medications Prior to Visit  Medication Sig Dispense Refill  . alendronate (FOSAMAX) 70 MG tablet TAKE ONE TABLET BY MOUTH ONCE A WEEK IN THE MORNING WITH A FULL GLASS OF WATER, 30 MINUTES BEFORE A MEAL OR BEVERAGE. REMAIN UPRIGHT 12 tablet 3  . aspirin EC 81 MG tablet Take 1 tablet (81 mg total) by mouth daily.    . Biotin 1000 MCG tablet Take 5,000 mcg by mouth 2 (two) times daily.     . Calcium-Vitamin D 600-200 MG-UNIT per tablet Take 1 tablet by mouth 3 (three) times daily with meals.      . cetirizine (ZYRTEC) 10 MG tablet TAKE ONE TABLET BY MOUTH ONCE DAILY 90 tablet 3  . Cholecalciferol (VITAMIN D3) 1000 UNITS CAPS Take 1 capsule by mouth daily.      Marland Kitchen EPIPEN 2-PAK 0.3 MG/0.3ML DEVI Reported on 07/26/2015    . Garlic Oil (SUPER GARLIC) 8527 MG CAPS Take 1 capsule by mouth daily.     Marland Kitchen L-Lysine 500 MG TABS Take 1 tablet by mouth daily.      . Lancets (FREESTYLE) lancets CHECK BLOOD SUGAR TWICE A DAY.  Dx code: E11.51 200 each 1  . Magnesium 250 MG TABS Take 1 tablet by mouth daily.     . Omega-3 Fatty Acids (OMEGA 3 PO) Take 2 tablets by mouth daily.     Marland Kitchen omeprazole (PRILOSEC) 40 MG capsule TAKE ONE CAPSULE BY MOUTH ONCE DAILY 90 capsule 0  . ONE TOUCH ULTRA TEST test strip CHECK BLOOD SUGAR TWO TIMES DAILY 200 each 11  . OVER THE COUNTER MEDICATION Maxi-vision 2 capsules per day    . SIMPLY SALINE NA Place 1 spray into the nose daily.      . Turmeric 500 MG CAPS Take 1 capsule by mouth 3 (three) times daily.      . vitamin C (ASCORBIC ACID) 500 MG tablet Take 1,000 mg by mouth daily.    Marland Kitchen amLODipine (NORVASC) 5 MG tablet Take 1 tablet (5 mg total) by mouth daily. 90 tablet 1  . levothyroxine (SYNTHROID, LEVOTHROID) 100 MCG tablet Take 1 tablet (100 mcg total) by mouth daily. 90 tablet 3  . metFORMIN (GLUCOPHAGE) 1000 MG tablet Take 1 tablet (1,000 mg total) by mouth 2 (two) times daily with a meal. 180 tablet 1  . pravastatin (PRAVACHOL) 10 MG tablet Take 1 tablet (10 mg total) by mouth daily. 90 tablet 0  . amLODipine (NORVASC) 5 MG  tablet TAKE ONE TABLET BY MOUTH ONCE DAILY 90 tablet 1  . HYDROcodone-acetaminophen (NORCO/VICODIN) 5-325 MG tablet Take 1 tablet by mouth every 6 (six) hours as needed for moderate pain. 20 tablet 0  . metFORMIN (GLUCOPHAGE) 1000 MG tablet TAKE ONE TABLET BY MOUTH TWICE DAILY WITH MEALS 180 tablet 1   No facility-administered medications prior to visit.     No Known Allergies  Review of Systems  Constitutional: Negative for chills, fever and malaise/fatigue.  HENT: Negative for congestion and hearing loss.   Eyes: Negative for discharge.  Respiratory: Negative for cough, sputum production and shortness of breath.   Cardiovascular: Negative for chest pain, palpitations and leg swelling.  Gastrointestinal: Negative for abdominal pain, blood in stool, constipation, diarrhea, heartburn, nausea and vomiting.  Genitourinary: Negative for dysuria, frequency, hematuria and urgency.  Musculoskeletal: Negative for back pain, falls and myalgias.  Skin: Negative for rash.  Neurological: Negative for dizziness, sensory change, loss of consciousness, weakness and headaches.  Endo/Heme/Allergies: Negative for environmental allergies. Does not bruise/bleed easily.  Psychiatric/Behavioral: Negative for depression and suicidal ideas. The patient is not nervous/anxious and does not have insomnia.        Objective:    Physical Exam  Constitutional: She is oriented to person,  place, and time. She appears well-developed and well-nourished.  HENT:  Head: Normocephalic and atraumatic.  Eyes: Conjunctivae and EOM are normal.  Neck: Normal range of motion. Neck supple. No JVD present. Carotid bruit is not present. No thyromegaly present.  Cardiovascular: Normal rate, regular rhythm and normal heart sounds.   No murmur heard. Pulmonary/Chest: Effort normal and breath sounds normal. No respiratory distress. She has no wheezes. She has no rales. She exhibits no tenderness.  Musculoskeletal: She exhibits no edema.  Neurological: She is alert and oriented to person, place, and time.  Psychiatric: She has a normal mood and affect. Her behavior is normal. Judgment and thought content normal.  Nursing note and vitals reviewed. Sensory exam of the foot is normal, tested with the monofilament. Good pulses, no lesions or ulcers, good peripheral pulses.   BP (!) 154/84 (BP Location: Left Arm, Cuff Size: Normal)   Pulse 99   Temp 98.2 F (36.8 C) (Oral)   Resp 16   Ht 5' 1.8" (1.57 m)   Wt 144 lb (65.3 kg)   SpO2 99%   BMI 26.51 kg/m  Wt Readings from Last 3 Encounters:  05/28/16 144 lb (65.3 kg)  11/28/15 139 lb 6.4 oz (63.2 kg)  08/09/15 145 lb (65.8 kg)     Lab Results  Component Value Date   WBC 6.2 11/28/2015   HGB 13.4 11/28/2015   HCT 39.7 11/28/2015   PLT 223.0 11/28/2015   GLUCOSE 124 (H) 11/28/2015   CHOL 122 11/28/2015   TRIG 104.0 11/28/2015   HDL 46.40 11/28/2015   LDLCALC 55 11/28/2015   ALT 24 11/28/2015   AST 20 11/28/2015   NA 140 11/28/2015   K 3.9 11/28/2015   CL 101 11/28/2015   CREATININE 0.77 11/28/2015   BUN 14 11/28/2015   CO2 27 11/28/2015   TSH 1.31 11/28/2015   HGBA1C 6.9 (H) 11/28/2015   MICROALBUR 1.1 11/28/2015    Lab Results  Component Value Date   TSH 1.31 11/28/2015   Lab Results  Component Value Date   WBC 6.2 11/28/2015   HGB 13.4 11/28/2015   HCT 39.7 11/28/2015   MCV 92.3 11/28/2015   PLT 223.0  11/28/2015   Lab Results  Component Value Date   NA 140 11/28/2015   K 3.9 11/28/2015   CO2 27 11/28/2015   GLUCOSE 124 (H) 11/28/2015   BUN 14 11/28/2015   CREATININE 0.77 11/28/2015   BILITOT 0.4 11/28/2015   ALKPHOS 29 (L) 11/28/2015   AST 20 11/28/2015   ALT 24 11/28/2015   PROT 7.9 11/28/2015   ALBUMIN 4.7 11/28/2015   CALCIUM 9.8 11/28/2015   GFR 78.29 11/28/2015   Lab Results  Component Value Date   CHOL 122 11/28/2015   Lab Results  Component Value Date   HDL 46.40 11/28/2015   Lab Results  Component Value Date   LDLCALC 55 11/28/2015   Lab Results  Component Value Date   TRIG 104.0 11/28/2015   Lab Results  Component Value Date   CHOLHDL 3 11/28/2015   Lab Results  Component Value Date   HGBA1C 6.9 (H) 11/28/2015       Assessment & Plan:   Problem List Items Addressed This Visit      Unprioritized   DM (diabetes mellitus) type II uncontrolled, periph vascular disorder ([place])   Relevant Medications   pravastatin (PRAVACHOL) 10 MG tablet   metFORMIN (GLUCOPHAGE) 1000 MG tablet   amLODipine (NORVASC) 5 MG tablet   Other Relevant Orders   Hemoglobin A1c   Microalbumin, urine   Essential hypertension   Relevant Medications   pravastatin (PRAVACHOL) 10 MG tablet   amLODipine (NORVASC) 5 MG tablet   Other Relevant Orders   Comprehensive metabolic panel   Hair loss    Check labs Consider derm--- may be pravastatin       Relevant Orders   CBC with Differential/Platelet   T4, free   T3, free    Other Visit Diagnoses    Hyperlipidemia LDL goal <70    -  Primary   Relevant Medications   pravastatin (PRAVACHOL) 10 MG tablet   amLODipine (NORVASC) 5 MG tablet   Other Relevant Orders   Lipid panel   Hypothyroidism, adult       Relevant Medications   levothyroxine (SYNTHROID, LEVOTHROID) 100 MCG tablet   Other Relevant Orders   TSH   CBC with Differential/Platelet   T4, free   T3, free   DM (diabetes mellitus) type II controlled  peripheral vascular disorder ([facility])       Relevant Medications   pravastatin (PRAVACHOL) 10 MG tablet   metFORMIN (GLUCOPHAGE) 1000 MG tablet   amLODipine (NORVASC) 5 MG tablet      I have discontinued Ms. Salvas's HYDROcodone-acetaminophen. I am also having her maintain her Calcium-Vitamin D, L-Lysine, Magnesium, Omega-3 Fatty Acids (OMEGA 3 PO), Garlic Oil, Vitamin D3, SIMPLY SALINE NA, EPIPEN 2-PAK, Biotin, OVER THE COUNTER MEDICATION, Turmeric, vitamin C, aspirin EC, ONE TOUCH ULTRA TEST, cetirizine, alendronate, omeprazole, freestyle, pravastatin, metFORMIN, levothyroxine, and amLODipine.  Meds ordered this encounter  Medications  . pravastatin (PRAVACHOL) 10 MG tablet    Sig: Take 1 tablet (10 mg total) by mouth daily.    Dispense:  90 tablet    Refill:  0  . metFORMIN (GLUCOPHAGE) 1000 MG tablet    Sig: Take 1 tablet (1,000 mg total) by mouth 2 (two) times daily with a meal.    Dispense:  180 tablet    Refill:  1  . levothyroxine (SYNTHROID, LEVOTHROID) 100 MCG tablet    Sig: Take 1 tablet (100 mcg total) by mouth daily.    Dispense:  90 tablet    Refill:  3  . amLODipine (NORVASC)  5 MG tablet    Sig: Take 1 tablet (5 mg total) by mouth daily.    Dispense:  90 tablet    Refill:  1    CMA served as Education administrator during this visit. History, Physical and Plan performed by medical provider. Documentation and orders reviewed and attested to.  Ann Held, DO

## 2016-05-28 NOTE — Patient Instructions (Signed)
Carbohydrate Counting for Diabetes Mellitus, Adult Carbohydrate counting is a method for keeping track of how many carbohydrates you eat. Eating carbohydrates naturally increases the amount of sugar (glucose) in the blood. Counting how many carbohydrates you eat helps keep your blood glucose within normal limits, which helps you manage your diabetes (diabetes mellitus). It is important to know how many carbohydrates you can safely have in each meal. This is different for every person. A diet and nutrition specialist (registered dietitian) can help you make a meal plan and calculate how many carbohydrates you should have at each meal and snack. Carbohydrates are found in the following foods:  Grains, such as breads and cereals.  Dried beans and soy products.  Starchy vegetables, such as potatoes, peas, and corn.  Fruit and fruit juices.  Milk and yogurt.  Sweets and snack foods, such as cake, cookies, candy, chips, and soft drinks. How do I count carbohydrates? There are two ways to count carbohydrates in food. You can use either of the methods or a combination of both. Reading "Nutrition Facts" on packaged food  The "Nutrition Facts" list is included on the labels of almost all packaged foods and beverages in the [place] It includes:  The serving size.  Information about nutrients in each serving, including the grams (g) of carbohydrate per serving. To use the "Nutrition Facts":  Decide how many servings you will have.  Multiply the number of servings by the number of carbohydrates per serving.  The resulting number is the total amount of carbohydrates that you will be having. Learning standard serving sizes of other foods  When you eat foods containing carbohydrates that are not packaged or do not include "Nutrition Facts" on the label, you need to measure the servings in order to count the amount of carbohydrates:  Measure the foods that you will eat with a food scale or measuring  cup, if needed.  Decide how many standard-size servings you will eat.  Multiply the number of servings by 15. Most carbohydrate-rich foods have about 15 g of carbohydrates per serving.  For example, if you eat 8 oz (170 g) of strawberries, you will have eaten 2 servings and 30 g of carbohydrates (2 servings x 15 g = 30 g).  For foods that have more than one food mixed, such as soups and casseroles, you must count the carbohydrates in each food that is included. The following list contains standard serving sizes of common carbohydrate-rich foods. Each of these servings has about 15 g of carbohydrates:   hamburger bun or  English muffin.   oz (15 mL) syrup.   oz (14 g) jelly.  1 slice of bread.  1 six-inch tortilla.  3 oz (85 g) cooked rice or pasta.  4 oz (113 g) cooked dried beans.  4 oz (113 g) starchy vegetable, such as peas, corn, or potatoes.  4 oz (113 g) hot cereal.  4 oz (113 g) mashed potatoes or  of a large baked potato.  4 oz (113 g) canned or frozen fruit.  4 oz (120 mL) fruit juice.  4-6 crackers.  6 chicken nuggets.  6 oz (170 g) unsweetened dry cereal.  6 oz (170 g) plain fat-free yogurt or yogurt sweetened with artificial sweeteners.  8 oz (240 mL) milk.  8 oz (170 g) fresh fruit or one small piece of fruit.  24 oz (680 g) popped popcorn. Example of carbohydrate counting Sample meal  3 oz (85 g) chicken breast.  6 oz (  170 g) brown rice.  4 oz (113 g) corn.  8 oz (240 mL) milk.  8 oz (170 g) strawberries with sugar-free whipped topping. Carbohydrate calculation 1. Identify the foods that contain carbohydrates:  Rice.  Corn.  Milk.  Strawberries. 2. Calculate how many servings you have of each food:  2 servings rice.  1 serving corn.  1 serving milk.  1 serving strawberries. 3. Multiply each number of servings by 15 g:  2 servings rice x 15 g = 30 g.  1 serving corn x 15 g = 15 g.  1 serving milk x 15 g = 15  g.  1 serving strawberries x 15 g = 15 g. 4. Add together all of the amounts to find the total grams of carbohydrates eaten:  30 g + 15 g + 15 g + 15 g = 75 g of carbohydrates total. This information is not intended to replace advice given to you by your health care provider. Make sure you discuss any questions you have with your health care provider. Document Released: 04/08/2005 Document Revised: 10/27/2015 Document Reviewed: 09/20/2015 Elsevier Interactive Patient Education  2017 [employer]  

## 2016-05-28 NOTE — Progress Notes (Signed)
Pre visit review using our clinic review tool, if applicable. No additional management support is needed unless otherwise documented below in the visit note. 

## 2016-05-28 NOTE — Assessment & Plan Note (Signed)
Check labs Consider derm--- may be pravastatin

## 2016-06-02 ENCOUNTER — Other Ambulatory Visit: Payer: Self-pay | Admitting: Family Medicine

## 2016-06-03 ENCOUNTER — Other Ambulatory Visit: Payer: Self-pay | Admitting: Family Medicine

## 2016-06-03 DIAGNOSIS — E785 Hyperlipidemia, unspecified: Secondary | ICD-10-CM

## 2016-06-03 DIAGNOSIS — E119 Type 2 diabetes mellitus without complications: Secondary | ICD-10-CM

## 2016-06-03 MED ORDER — SITAGLIPTIN PHOSPHATE 100 MG PO TABS
100.0000 mg | ORAL_TABLET | Freq: Every day | ORAL | 5 refills | Status: DC
Start: 2016-06-03 — End: 2016-09-03

## 2016-06-05 ENCOUNTER — Other Ambulatory Visit: Payer: Self-pay | Admitting: Family Medicine

## 2016-06-05 DIAGNOSIS — E039 Hypothyroidism, unspecified: Secondary | ICD-10-CM

## 2016-06-06 DIAGNOSIS — J3089 Other allergic rhinitis: Secondary | ICD-10-CM | POA: Diagnosis not present

## 2016-06-06 DIAGNOSIS — J301 Allergic rhinitis due to pollen: Secondary | ICD-10-CM | POA: Diagnosis not present

## 2016-06-06 DIAGNOSIS — J3081 Allergic rhinitis due to animal (cat) (dog) hair and dander: Secondary | ICD-10-CM | POA: Diagnosis not present

## 2016-06-21 ENCOUNTER — Other Ambulatory Visit: Payer: Self-pay | Admitting: Family Medicine

## 2016-06-21 DIAGNOSIS — K219 Gastro-esophageal reflux disease without esophagitis: Secondary | ICD-10-CM

## 2016-07-05 DIAGNOSIS — J301 Allergic rhinitis due to pollen: Secondary | ICD-10-CM | POA: Diagnosis not present

## 2016-07-05 DIAGNOSIS — J3089 Other allergic rhinitis: Secondary | ICD-10-CM | POA: Diagnosis not present

## 2016-09-03 ENCOUNTER — Other Ambulatory Visit: Payer: Medicare Other

## 2016-09-03 ENCOUNTER — Telehealth: Payer: Self-pay | Admitting: *Deleted

## 2016-09-03 MED ORDER — SITAGLIPTIN PHOS-METFORMIN HCL 50-1000 MG PO TABS: 2.0000 | ORAL_TABLET | Freq: Every day | ORAL | 2 refills | Status: DC

## 2016-09-03 MED ORDER — SITAGLIP PHOS-METFORMIN HCL ER 50-1000 MG PO TB24: 2.0000 | ORAL_TABLET | Freq: Every day | ORAL | 2 refills | Status: DC

## 2016-09-03 NOTE — Telephone Encounter (Signed)
Patient sent in blood sugars.  Dr. Sherrine Maples want her to start Janumet XR 2tabs qd and to keep lab appt.  Advised patient and she has to reschedule her lab appt.  She was also notified to stop the metformin and Januvia.

## 2016-09-20 ENCOUNTER — Other Ambulatory Visit: Payer: Self-pay | Admitting: Family Medicine

## 2016-09-30 DIAGNOSIS — J3089 Other allergic rhinitis: Secondary | ICD-10-CM | POA: Diagnosis not present

## 2016-09-30 DIAGNOSIS — J3081 Allergic rhinitis due to animal (cat) (dog) hair and dander: Secondary | ICD-10-CM | POA: Diagnosis not present

## 2016-09-30 DIAGNOSIS — J301 Allergic rhinitis due to pollen: Secondary | ICD-10-CM | POA: Diagnosis not present

## 2016-10-03 DIAGNOSIS — J3081 Allergic rhinitis due to animal (cat) (dog) hair and dander: Secondary | ICD-10-CM | POA: Diagnosis not present

## 2016-10-03 DIAGNOSIS — J301 Allergic rhinitis due to pollen: Secondary | ICD-10-CM | POA: Diagnosis not present

## 2016-10-03 DIAGNOSIS — J3089 Other allergic rhinitis: Secondary | ICD-10-CM | POA: Diagnosis not present

## 2016-10-04 DIAGNOSIS — J3081 Allergic rhinitis due to animal (cat) (dog) hair and dander: Secondary | ICD-10-CM | POA: Diagnosis not present

## 2016-10-04 DIAGNOSIS — J3089 Other allergic rhinitis: Secondary | ICD-10-CM | POA: Diagnosis not present

## 2016-10-07 DIAGNOSIS — J3081 Allergic rhinitis due to animal (cat) (dog) hair and dander: Secondary | ICD-10-CM | POA: Diagnosis not present

## 2016-10-07 DIAGNOSIS — J301 Allergic rhinitis due to pollen: Secondary | ICD-10-CM | POA: Diagnosis not present

## 2016-10-07 DIAGNOSIS — J3089 Other allergic rhinitis: Secondary | ICD-10-CM | POA: Diagnosis not present

## 2016-10-10 DIAGNOSIS — J301 Allergic rhinitis due to pollen: Secondary | ICD-10-CM | POA: Diagnosis not present

## 2016-10-10 DIAGNOSIS — J3089 Other allergic rhinitis: Secondary | ICD-10-CM | POA: Diagnosis not present

## 2016-10-10 DIAGNOSIS — J3081 Allergic rhinitis due to animal (cat) (dog) hair and dander: Secondary | ICD-10-CM | POA: Diagnosis not present

## 2016-10-16 DIAGNOSIS — J3081 Allergic rhinitis due to animal (cat) (dog) hair and dander: Secondary | ICD-10-CM | POA: Diagnosis not present

## 2016-10-16 DIAGNOSIS — J3089 Other allergic rhinitis: Secondary | ICD-10-CM | POA: Diagnosis not present

## 2016-10-16 DIAGNOSIS — J301 Allergic rhinitis due to pollen: Secondary | ICD-10-CM | POA: Diagnosis not present

## 2016-10-18 DIAGNOSIS — J3089 Other allergic rhinitis: Secondary | ICD-10-CM | POA: Diagnosis not present

## 2016-10-18 DIAGNOSIS — J3081 Allergic rhinitis due to animal (cat) (dog) hair and dander: Secondary | ICD-10-CM | POA: Diagnosis not present

## 2016-10-18 DIAGNOSIS — J301 Allergic rhinitis due to pollen: Secondary | ICD-10-CM | POA: Diagnosis not present

## 2016-10-21 DIAGNOSIS — J301 Allergic rhinitis due to pollen: Secondary | ICD-10-CM | POA: Diagnosis not present

## 2016-10-21 DIAGNOSIS — J3081 Allergic rhinitis due to animal (cat) (dog) hair and dander: Secondary | ICD-10-CM | POA: Diagnosis not present

## 2016-10-21 DIAGNOSIS — J3089 Other allergic rhinitis: Secondary | ICD-10-CM | POA: Diagnosis not present

## 2016-10-25 DIAGNOSIS — J3089 Other allergic rhinitis: Secondary | ICD-10-CM | POA: Diagnosis not present

## 2016-10-25 DIAGNOSIS — J3081 Allergic rhinitis due to animal (cat) (dog) hair and dander: Secondary | ICD-10-CM | POA: Diagnosis not present

## 2016-10-28 ENCOUNTER — Other Ambulatory Visit: Payer: Self-pay | Admitting: Family Medicine

## 2016-10-28 DIAGNOSIS — J301 Allergic rhinitis due to pollen: Secondary | ICD-10-CM | POA: Diagnosis not present

## 2016-10-28 DIAGNOSIS — J3081 Allergic rhinitis due to animal (cat) (dog) hair and dander: Secondary | ICD-10-CM | POA: Diagnosis not present

## 2016-10-28 DIAGNOSIS — J3089 Other allergic rhinitis: Secondary | ICD-10-CM | POA: Diagnosis not present

## 2016-10-30 DIAGNOSIS — J301 Allergic rhinitis due to pollen: Secondary | ICD-10-CM | POA: Diagnosis not present

## 2016-10-30 DIAGNOSIS — J3089 Other allergic rhinitis: Secondary | ICD-10-CM | POA: Diagnosis not present

## 2016-10-30 DIAGNOSIS — J3081 Allergic rhinitis due to animal (cat) (dog) hair and dander: Secondary | ICD-10-CM | POA: Diagnosis not present

## 2016-11-25 DIAGNOSIS — J301 Allergic rhinitis due to pollen: Secondary | ICD-10-CM | POA: Diagnosis not present

## 2016-11-25 DIAGNOSIS — J3081 Allergic rhinitis due to animal (cat) (dog) hair and dander: Secondary | ICD-10-CM | POA: Diagnosis not present

## 2016-11-25 DIAGNOSIS — J3089 Other allergic rhinitis: Secondary | ICD-10-CM | POA: Diagnosis not present

## 2016-12-03 ENCOUNTER — Encounter: Payer: Self-pay | Admitting: Family Medicine

## 2016-12-03 ENCOUNTER — Ambulatory Visit (INDEPENDENT_AMBULATORY_CARE_PROVIDER_SITE_OTHER): Payer: Medicare Other | Admitting: Family Medicine

## 2016-12-03 VITALS — BP 152/80 | HR 94 | Temp 97.5°F | Ht 62.0 in | Wt 137.2 lb

## 2016-12-03 DIAGNOSIS — E785 Hyperlipidemia, unspecified: Secondary | ICD-10-CM

## 2016-12-03 DIAGNOSIS — R21 Rash and other nonspecific skin eruption: Secondary | ICD-10-CM | POA: Diagnosis not present

## 2016-12-03 DIAGNOSIS — E1165 Type 2 diabetes mellitus with hyperglycemia: Secondary | ICD-10-CM | POA: Diagnosis not present

## 2016-12-03 DIAGNOSIS — E1151 Type 2 diabetes mellitus with diabetic peripheral angiopathy without gangrene: Secondary | ICD-10-CM | POA: Diagnosis not present

## 2016-12-03 DIAGNOSIS — IMO0002 Reserved for concepts with insufficient information to code with codable children: Secondary | ICD-10-CM

## 2016-12-03 DIAGNOSIS — E039 Hypothyroidism, unspecified: Secondary | ICD-10-CM

## 2016-12-03 DIAGNOSIS — I10 Essential (primary) hypertension: Secondary | ICD-10-CM

## 2016-12-03 LAB — LIPID PANEL
CHOLESTEROL: 114 mg/dL (ref 0–200)
HDL: 44.3 mg/dL (ref >39.00)
LDL Cholesterol: 51 mg/dL (ref 0–99)
NonHDL: 69.83
Total CHOL/HDL Ratio: 3
Triglycerides: 94 mg/dL (ref 0.0–149.0)
VLDL: 18.8 mg/dL (ref 0.0–40.0)

## 2016-12-03 LAB — TSH: TSH: 1.25 u[IU]/mL (ref 0.35–4.50)

## 2016-12-03 LAB — COMPREHENSIVE METABOLIC PANEL
ALT: 17 U/L (ref 0–35)
AST: 19 U/L (ref 0–37)
Albumin: 4.7 g/dL (ref 3.5–5.2)
Alkaline Phosphatase: 31 U/L — ABNORMAL LOW (ref 39–117)
BUN: 16 mg/dL (ref 6–23)
CHLORIDE: 102 meq/L (ref 96–112)
CO2: 25 meq/L (ref 19–32)
Calcium: 9.8 mg/dL (ref 8.4–10.5)
Creatinine, Ser: 0.83 mg/dL (ref 0.40–1.20)
GFR: 71.59 mL/min (ref >60.00)
GLUCOSE: 133 mg/dL — AB (ref 70–99)
POTASSIUM: 4.3 meq/L (ref 3.5–5.1)
SODIUM: 138 meq/L (ref 135–145)
TOTAL PROTEIN: 7.4 g/dL (ref 6.0–8.3)
Total Bilirubin: 0.5 mg/dL (ref 0.2–1.2)

## 2016-12-03 LAB — HEMOGLOBIN A1C: HEMOGLOBIN A1C: 7.1 % — AB (ref 4.6–6.5)

## 2016-12-03 MED ORDER — LOSARTAN POTASSIUM 50 MG PO TABS: 50.0000 mg | ORAL_TABLET | Freq: Every day | ORAL | 3 refills | Status: DC

## 2016-12-03 MED ORDER — LEVOTHYROXINE SODIUM 100 MCG PO TABS: 100.0000 ug | ORAL_TABLET | Freq: Every day | ORAL | 1 refills | Status: DC

## 2016-12-03 NOTE — Patient Instructions (Signed)

## 2016-12-03 NOTE — Progress Notes (Signed)
Patient ID: Cheryl Cisneros, female    DOB: [birth year]  Age: 71 y.o. MRN: [id number]    Subjective:  Subjective  HPI Cheryl Cisneros presents for f/u dm, cholesterol , bp and thyroid Pt also c/o rash on face x 1 year that will worsen and lighten up.  She has used otc cortisone and neosporin with no relief  It is not pruritic, it is tender to touch and has not spread anywhere.  No new lotions ,m wash etc.  She is requesting derm referral   HYPERTENSION   Blood pressure range-creeping up  Chest pain- no      Dyspnea- no Lightheadedness- no   Edema- no  Other side effects - no   Medication compliance: good Low salt diet- yes    DIABETES    Blood Sugar ranges-150-200  Polyuria- no New Visual problems- no  Hypoglycemic symptoms- no  Other side effects-no Medication compliance - good Last eye exam- 12/2015 Foot exam- today   HYPERLIPIDEMIA  Medication compliance- good RUQ pain- no  Muscle aches- no Other side effects-no   Review of Systems  Constitutional: Negative for appetite change, diaphoresis, fatigue and unexpected weight change.  Eyes: Negative for pain, redness and visual disturbance.  Respiratory: Negative for cough, chest tightness, shortness of breath and wheezing.   Cardiovascular: Negative for chest pain, palpitations and leg swelling.  Endocrine: Negative for cold intolerance, heat intolerance, polydipsia, polyphagia and polyuria.  Genitourinary: Negative for difficulty urinating, dysuria and frequency.  Neurological: Negative for dizziness, light-headedness, numbness and headaches.    History Past Medical History:  Diagnosis Date  . Allergy   . Arthritis   . Asthma   . Borderline glaucoma   . Cataract   . Diabetes ([place])   . GERD (gastroesophageal reflux disease)   . Hypertension   . Macular degeneration 12/25/2011  . Osteopenia   . Thyroid disease    Hypothyroidism    She has a past surgical history that includes Tonsillectomy (1963); Knee  arthroscopy (Right, 2002); Lumbar laminectomy (1999); Rotator cuff repair (Right, 02/27/2005); Abdominal hysterectomy (1986); and Back surgery (2016).   Her family history includes Cancer in her father; Cancer (age of onset: 57) in her daughter; Cervical cancer in her daughter; Coronary artery disease in her unknown relative; Dementia in her sister; Diabetes in her brother, father, mother, sister, and sister; Hyperlipidemia in her brother, sister, and sister; Hypertension in her brother, father, mother, sister, and sister; Kidney disease in her mother; Lung cancer in her unknown relative; Other in her sister.She reports that she has never smoked. She has never used smokeless tobacco. She reports that she does not drink alcohol or use drugs.  Current Outpatient Prescriptions on File Prior to Visit  Medication Sig Dispense Refill  . alendronate (FOSAMAX) 70 MG tablet TAKE ONE TABLET BY MOUTH ONCE A WEEK IN THE MORNING WITH A FULL GLASS OF WATER, 30 MINUTES BEFORE A MEAL OR BEVERAGE. REMAIN UPRIGHT 12 tablet 3  . aspirin EC 81 MG tablet Take 1 tablet (81 mg total) by mouth daily.    . Biotin 1000 MCG tablet Take 5,000 mcg by mouth 2 (two) times daily.     . Calcium-Vitamin D 600-200 MG-UNIT per tablet Take 1 tablet by mouth 3 (three) times daily with meals.      . Cholecalciferol (VITAMIN D3) 1000 UNITS CAPS Take 1 capsule by mouth daily.      Marland Kitchen EPIPEN 2-PAK 0.3 MG/0.3ML DEVI Reported on 07/26/2015    . EQ  ALLERGY RELIEF, CETIRIZINE, 10 MG tablet TAKE ONE TABLET BY MOUTH ONCE DAILY 90 tablet 3  . Garlic Oil (SUPER GARLIC) 7124 MG CAPS Take 1 capsule by mouth daily.     Marland Kitchen L-Lysine 500 MG TABS Take 1 tablet by mouth daily.      . Lancets (FREESTYLE) lancets USE   TO CHECK GLUCOSE TWICE DAILY 200 each 1  . Magnesium 250 MG TABS Take 1 tablet by mouth daily.     . Omega-3 Fatty Acids (OMEGA 3 PO) Take 2 tablets by mouth daily.     Marland Kitchen omeprazole (PRILOSEC) 40 MG capsule TAKE ONE CAPSULE BY MOUTH ONCE DAILY 90  capsule 2  . ONE TOUCH ULTRA TEST test strip CHECK BLOOD SUGAR TWO TIMES DAILY 200 each 11  . OVER THE COUNTER MEDICATION Maxi-vision 2 capsules per day    . pravastatin (PRAVACHOL) 10 MG tablet Take 1 tablet (10 mg total) by mouth daily. 90 tablet 0  . SIMPLY SALINE NA Place 1 spray into the nose daily.      . SitaGLIPtin-MetFORMIN HCl (JANUMET XR) 50-1000 MG TB24 Take 2 tablets by mouth daily. 60 tablet 2  . Turmeric 500 MG CAPS Take 1 capsule by mouth 3 (three) times daily.     . vitamin C (ASCORBIC ACID) 500 MG tablet Take 1,000 mg by mouth daily.     No current facility-administered medications on file prior to visit.      Objective:  Objective  Physical Exam  Constitutional: She is oriented to person, place, and time. She appears well-developed and well-nourished.  HENT:  Head: Normocephalic and atraumatic.    Eyes: Conjunctivae and EOM are normal.  Neck: Normal range of motion. Neck supple. No JVD present. Carotid bruit is not present. No thyromegaly present.  Cardiovascular: Normal rate and regular rhythm.   Murmur heard. Pulmonary/Chest: Effort normal and breath sounds normal. No respiratory distress. She has no wheezes. She has no rales. She exhibits no tenderness.  Musculoskeletal: She exhibits no edema.  Neurological: She is alert and oriented to person, place, and time.  Skin: Rash noted. Rash is papular.  Psychiatric: She has a normal mood and affect. Her behavior is normal. Judgment and thought content normal.  Nursing note and vitals reviewed.  BP (!) 152/80 (BP Location: Left Arm, Patient Position: Sitting, Cuff Size: Normal)   Pulse 94   Temp (!) 97.5 F (36.4 C) (Oral)   Ht 5\' 2"  (1.575 m)   Wt 137 lb 4 oz (62.3 kg)   SpO2 96%   BMI 25.10 kg/m  Wt Readings from Last 3 Encounters:  12/03/16 137 lb 4 oz (62.3 kg)  05/28/16 144 lb (65.3 kg)  11/28/15 139 lb 6.4 oz (63.2 kg)     Lab Results  Component Value Date   WBC 6.0 05/28/2016   HGB 13.8  05/28/2016   HCT 40.4 05/28/2016   PLT 235.0 05/28/2016   GLUCOSE 187 (H) 05/28/2016   CHOL 135 05/28/2016   TRIG 133.0 05/28/2016   HDL 46.10 05/28/2016   LDLCALC 63 05/28/2016   ALT 26 05/28/2016   AST 21 05/28/2016   NA 137 05/28/2016   K 4.3 05/28/2016   CL 100 05/28/2016   CREATININE 0.84 05/28/2016   BUN 18 05/28/2016   CO2 27 05/28/2016   TSH 1.35 05/28/2016   HGBA1C 7.4 (H) 05/28/2016   MICROALBUR 4.9 05/28/2016    Mr Lumbar Spine Wo Contrast  Result Date: 12/02/2015 CLINICAL DATA:  Initial evaluation for  two-month history of low back pain, radiating into left lower extremity. Worse with sitting. History of prior surgery. EXAM: MRI LUMBAR SPINE WITHOUT CONTRAST TECHNIQUE: Multiplanar, multisequence MR imaging of the lumbar spine was performed. No intravenous contrast was administered. COMPARISON:  Prior MRI from 07/23/2014. FINDINGS: Segmentation: Normal segmentation. Lowest well-formed this is presumed to be the L5-S1 level. Alignment: Stable alignment with preservation of the normal lumbar lordosis. No listhesis or subluxation. Vertebrae: Vertebral body heights are maintained. No acute or chronic fracture. Signal intensity within the vertebral body bone marrow is normal. Benign hemangioma noted within the L4 vertebral body. No marrow edema. Conus medullaris: Extends to the L1 level and appears normal. Paraspinal and other soft tissues: Paraspinous soft tissues demonstrate no acute abnormality. Visualized visceral structures within normal limits. No retroperitoneal adenopathy. Intra-abdominal aorta of normal caliber. Disc levels: No significant degenerative changes are seen from the T11-12 through the L2-3 levels. L3-4: Diffuse disc bulge with disc desiccation. Superimposed shallow right foraminal disc protrusion abutting the exiting left L3 nerve root as it courses out of the left neural foramen (series 5, image 17). Overall, this is similar to previous study. Superimposed mild facet  and ligamentum flavum hypertrophy. No significant canal or foraminal encroachment. L4-5: Interval performance of a decompressive left hemi laminectomy has been performed at this level since the previous exam. Persistent disc bulge with disc desiccation and intervertebral disc space narrowing. There is a persistent left subarticular disc protrusion extending into the left lateral recess, potentially affecting the left L5 nerve root. This appears slightly more prominent as compared to prior study, and is suspicious for possible recurrent disc herniation. Disc bulge again extends into the left neural foramen without frank impingement of the left L4 nerve root. Superimposed facet arthrosis with mild ligamentum flavum hypertrophy. No significant canal stenosis. Minimal right foraminal encroachment related to disc bulge and facet disease. L5-S1: Remote left laminectomy defect again noted. Degenerative disc desiccation with intervertebral disc space narrowing and diffuse disc bulge. Superimposed osteophytic endplate spurring. Mild left-sided facet arthrosis. Previously noted fibrosis surrounding the S1 nerve root not well appreciated on this noncontrast examination, but is likely unchanged. No significant canal stenosis. Mild left foraminal stenosis, stable. IMPRESSION: 1. Left subarticular disc protrusion, slightly more prominent as compared to preoperative MRI from 2016, and suspicious for possible recurrent disc herniation. This potentially affects the transiting left L5 nerve root in the left lateral recess. Postoperative sequela from interval decompressive left hemi laminectomy at this level. 2. Stable small left extra foraminal disc protrusion, contacting the exiting left L3 nerve root. 3. Stable postsurgical changes on the left at L5-S1 without new or recurrent stenosis. Electronically Signed   By: Jeannine Boga M.D.   On: 12/02/2015 22:18     Assessment & Plan:  Plan  I have discontinued Ms. Kutch's  amLODipine. I have also changed her levothyroxine. Additionally, I am having her start on losartan. Lastly, I am having her maintain her Calcium-Vitamin D, L-Lysine, Magnesium, Omega-3 Fatty Acids (OMEGA 3 PO), Garlic Oil, Vitamin D3, SIMPLY SALINE NA, EPIPEN 2-PAK, Biotin, OVER THE COUNTER MEDICATION, Turmeric, vitamin C, aspirin EC, alendronate, pravastatin, omeprazole, SitaGLIPtin-MetFORMIN HCl, EQ ALLERGY RELIEF (CETIRIZINE), freestyle, and ONE TOUCH ULTRA TEST.  Meds ordered this encounter  Medications  . levothyroxine (SYNTHROID, LEVOTHROID) 100 MCG tablet    Sig: Take 1 tablet (100 mcg total) by mouth daily.    Dispense:  90 tablet    Refill:  1  . losartan (COZAAR) 50 MG tablet  Sig: Take 1 tablet (50 mg total) by mouth daily.    Dispense:  30 tablet    Refill:  3    Problem List Items Addressed This Visit      Unprioritized   DM (diabetes mellitus) type II uncontrolled, periph vascular disorder ([place]) - Primary    hgba1c to be done, minimize simple carbs. Increase exercise as tolerated. Continue current meds      Relevant Medications   losartan (COZAAR) 50 MG tablet   Other Relevant Orders   Hemoglobin A1c   Essential hypertension    Poorly controlled will alter medications, encouraged DASH diet, minimize caffeine and obtain adequate sleep. Report concerning symptoms and follow up as directed and as needed      Relevant Medications   losartan (COZAAR) 50 MG tablet   Other Relevant Orders   Hemoglobin A1c   Lipid panel   TSH   Comprehensive metabolic panel    Other Visit Diagnoses    Hypothyroidism, unspecified type       Relevant Medications   levothyroxine (SYNTHROID, LEVOTHROID) 100 MCG tablet   Other Relevant Orders   TSH   Hyperlipidemia LDL goal <70       Relevant Medications   losartan (COZAAR) 50 MG tablet   Other Relevant Orders   Lipid panel   Comprehensive metabolic panel   Rash of face       Relevant Orders   Ambulatory referral to Dermatology        Follow-up: Return in about 6 months (around 06/05/2017) for hypertension, hyperlipidemia, diabetes II.  Ann Held, DO

## 2016-12-03 NOTE — Assessment & Plan Note (Signed)
Poorly controlled will alter medications, encouraged DASH diet, minimize caffeine and obtain adequate sleep. Report concerning symptoms and follow up as directed and as needed 

## 2016-12-03 NOTE — Progress Notes (Signed)
Pre visit review using our clinic review tool, if applicable. No additional management support is needed unless otherwise documented below in the visit note. 

## 2016-12-03 NOTE — Assessment & Plan Note (Signed)
hgba1c to be done, minimize simple carbs. Increase exercise as tolerated. Continue current meds  

## 2016-12-04 ENCOUNTER — Telehealth: Payer: Self-pay | Admitting: Family Medicine

## 2016-12-04 NOTE — Telephone Encounter (Signed)
°  Relation to pt: self  Call back number:[phone]  Reason for call:  Patient last seen 12/03/16 and forgot to mention to PCP her Sentinel blood glucose monitoring device broke inquiring if we have a monitor in the office for pick up, please advise

## 2016-12-05 ENCOUNTER — Other Ambulatory Visit: Payer: Self-pay | Admitting: Family Medicine

## 2016-12-05 DIAGNOSIS — E785 Hyperlipidemia, unspecified: Secondary | ICD-10-CM

## 2016-12-05 DIAGNOSIS — E119 Type 2 diabetes mellitus without complications: Secondary | ICD-10-CM

## 2016-12-05 MED ORDER — NATEGLINIDE 60 MG PO TABS: 60.0000 mg | ORAL_TABLET | Freq: Three times a day (TID) | ORAL | 2 refills | Status: DC

## 2016-12-05 MED ORDER — ONETOUCH DELICA LANCING DEV MISC: 0 refills | Status: DC

## 2016-12-05 NOTE — Telephone Encounter (Signed)
Spoke to the patient and she is requesting a new lancet device as her is broken. Sent in for her as she requested.

## 2016-12-19 ENCOUNTER — Telehealth: Payer: Self-pay | Admitting: Family Medicine

## 2016-12-19 NOTE — Telephone Encounter (Signed)
Caller name: Relation to BO:FBPZ Call back number:[phone] Pharmacy:  Reason for call: pt states she received a call to schedule a wellness visit, pt states she already had a wellness on 12/03/16, pt would like to confirm system shows she was here on 8/14 for wellness visit.

## 2016-12-19 NOTE — Telephone Encounter (Signed)
I spoke with pt and explained to her about what her appt was, looks like it was a follow up appt. Glenard Haring ended up speaking with pt about a wellness visit.

## 2016-12-19 NOTE — Telephone Encounter (Signed)
Pt does not feel that she needs AWV at this time.

## 2017-01-07 DIAGNOSIS — Z23 Encounter for immunization: Secondary | ICD-10-CM | POA: Diagnosis not present

## 2017-01-25 ENCOUNTER — Other Ambulatory Visit: Payer: Self-pay | Admitting: Family Medicine

## 2017-01-25 DIAGNOSIS — E785 Hyperlipidemia, unspecified: Secondary | ICD-10-CM

## 2017-03-03 ENCOUNTER — Other Ambulatory Visit: Payer: Self-pay | Admitting: Family Medicine

## 2017-03-07 ENCOUNTER — Other Ambulatory Visit: Payer: Medicare Other

## 2017-03-19 ENCOUNTER — Other Ambulatory Visit: Payer: Self-pay | Admitting: *Deleted

## 2017-03-19 MED ORDER — GLUCOSE BLOOD VI STRP: ORAL_STRIP | 11 refills | Status: DC

## 2017-03-19 MED ORDER — SITAGLIP PHOS-METFORMIN HCL ER 50-1000 MG PO TB24: 2.0000 | ORAL_TABLET | Freq: Every day | ORAL | 2 refills | Status: DC

## 2017-03-20 ENCOUNTER — Telehealth: Payer: Self-pay | Admitting: Family Medicine

## 2017-03-20 MED ORDER — GLUCOSE BLOOD VI STRP: ORAL_STRIP | 11 refills | Status: DC

## 2017-03-20 NOTE — Telephone Encounter (Signed)
Needs diagnosis code.

## 2017-03-20 NOTE — Telephone Encounter (Signed)
Copied from [place] [id number]. Topic: Quick Communication - Rx Refill/Question >> Mar 20, 2017 11:17 AM Scherrie Gerlach wrote: Has the patient contacted their pharmacy? {yes   glucose blood (ONE TOUCH ULTRA TEST) test strip [facility] received rx for the pt's test strips, and there is no dx code.  Please resend with the dx code attached. Thank you!!   Preferred Pharmacy (with phone number or street name): [facility]   Agent: Please be advised that RX refills may take up to 48 hours. We ask that you follow-up with your pharmacy.

## 2017-03-26 ENCOUNTER — Other Ambulatory Visit: Payer: Self-pay | Admitting: Family Medicine

## 2017-03-26 DIAGNOSIS — K219 Gastro-esophageal reflux disease without esophagitis: Secondary | ICD-10-CM

## 2017-03-27 ENCOUNTER — Other Ambulatory Visit: Payer: Self-pay

## 2017-03-28 DIAGNOSIS — J4520 Mild intermittent asthma, uncomplicated: Secondary | ICD-10-CM | POA: Diagnosis not present

## 2017-03-28 DIAGNOSIS — J3081 Allergic rhinitis due to animal (cat) (dog) hair and dander: Secondary | ICD-10-CM | POA: Diagnosis not present

## 2017-03-28 DIAGNOSIS — J3089 Other allergic rhinitis: Secondary | ICD-10-CM | POA: Diagnosis not present

## 2017-03-28 DIAGNOSIS — J301 Allergic rhinitis due to pollen: Secondary | ICD-10-CM | POA: Diagnosis not present

## 2017-04-02 DIAGNOSIS — H35033 Hypertensive retinopathy, bilateral: Secondary | ICD-10-CM | POA: Diagnosis not present

## 2017-04-02 DIAGNOSIS — H2513 Age-related nuclear cataract, bilateral: Secondary | ICD-10-CM | POA: Diagnosis not present

## 2017-04-02 DIAGNOSIS — E119 Type 2 diabetes mellitus without complications: Secondary | ICD-10-CM | POA: Diagnosis not present

## 2017-04-04 ENCOUNTER — Other Ambulatory Visit: Payer: Self-pay | Admitting: *Deleted

## 2017-04-04 ENCOUNTER — Other Ambulatory Visit: Payer: Self-pay | Admitting: Family Medicine

## 2017-04-04 ENCOUNTER — Other Ambulatory Visit (INDEPENDENT_AMBULATORY_CARE_PROVIDER_SITE_OTHER): Payer: Medicare Other

## 2017-04-04 DIAGNOSIS — E1165 Type 2 diabetes mellitus with hyperglycemia: Principal | ICD-10-CM

## 2017-04-04 DIAGNOSIS — E785 Hyperlipidemia, unspecified: Secondary | ICD-10-CM

## 2017-04-04 DIAGNOSIS — E119 Type 2 diabetes mellitus without complications: Secondary | ICD-10-CM

## 2017-04-04 DIAGNOSIS — E1151 Type 2 diabetes mellitus with diabetic peripheral angiopathy without gangrene: Secondary | ICD-10-CM

## 2017-04-04 DIAGNOSIS — IMO0002 Reserved for concepts with insufficient information to code with codable children: Secondary | ICD-10-CM

## 2017-04-04 LAB — COMPREHENSIVE METABOLIC PANEL
ALBUMIN: 4.7 g/dL (ref 3.5–5.2)
ALK PHOS: 27 U/L — AB (ref 39–117)
ALT: 18 U/L (ref 0–35)
AST: 16 U/L (ref 0–37)
BILIRUBIN TOTAL: 0.6 mg/dL (ref 0.2–1.2)
BUN: 20 mg/dL (ref 6–23)
CALCIUM: 9.3 mg/dL (ref 8.4–10.5)
CO2: 27 mEq/L (ref 19–32)
Chloride: 102 mEq/L (ref 96–112)
Creatinine, Ser: 1.02 mg/dL (ref 0.40–1.20)
GFR: 56.38 mL/min — AB (ref >60.00)
GLUCOSE: 123 mg/dL — AB (ref 70–99)
POTASSIUM: 3.8 meq/L (ref 3.5–5.1)
Sodium: 138 mEq/L (ref 135–145)
TOTAL PROTEIN: 7.8 g/dL (ref 6.0–8.3)

## 2017-04-04 LAB — LIPID PANEL
CHOLESTEROL: 109 mg/dL (ref 0–200)
HDL: 46.2 mg/dL (ref >39.00)
LDL Cholesterol: 34 mg/dL (ref 0–99)
NONHDL: 63.01
TRIGLYCERIDES: 144 mg/dL (ref 0.0–149.0)
Total CHOL/HDL Ratio: 2
VLDL: 28.8 mg/dL (ref 0.0–40.0)

## 2017-04-04 LAB — HEMOGLOBIN A1C: Hgb A1c MFr Bld: 6.7 % — ABNORMAL HIGH (ref 4.6–6.5)

## 2017-04-04 MED ORDER — GLUCOSE BLOOD VI STRP: ORAL_STRIP | 12 refills | Status: DC

## 2017-04-05 ENCOUNTER — Other Ambulatory Visit: Payer: Self-pay | Admitting: Family Medicine

## 2017-04-05 DIAGNOSIS — I10 Essential (primary) hypertension: Secondary | ICD-10-CM

## 2017-04-07 DIAGNOSIS — J301 Allergic rhinitis due to pollen: Secondary | ICD-10-CM | POA: Diagnosis not present

## 2017-04-07 DIAGNOSIS — J3081 Allergic rhinitis due to animal (cat) (dog) hair and dander: Secondary | ICD-10-CM | POA: Diagnosis not present

## 2017-04-07 DIAGNOSIS — J3089 Other allergic rhinitis: Secondary | ICD-10-CM | POA: Diagnosis not present

## 2017-04-09 ENCOUNTER — Telehealth: Payer: Self-pay | Admitting: Family Medicine

## 2017-04-09 ENCOUNTER — Other Ambulatory Visit: Payer: Self-pay

## 2017-04-09 DIAGNOSIS — IMO0002 Reserved for concepts with insufficient information to code with codable children: Secondary | ICD-10-CM

## 2017-04-09 DIAGNOSIS — E1151 Type 2 diabetes mellitus with diabetic peripheral angiopathy without gangrene: Secondary | ICD-10-CM

## 2017-04-09 DIAGNOSIS — E1165 Type 2 diabetes mellitus with hyperglycemia: Principal | ICD-10-CM

## 2017-04-09 MED ORDER — GLUCOSE BLOOD VI STRP: ORAL_STRIP | 12 refills | Status: DC

## 2017-04-09 NOTE — Telephone Encounter (Signed)
Copied from [place] [id number]. Topic: Quick Communication - Rx Refill/Question >> Mar 20, 2017 11:17 AM Scherrie Gerlach wrote: Has the patient contacted their pharmacy? {yes    [facility] received rx for the pt's test strips, and there is no dx code.  Please resend with the dx code attached. Thank you!!   Preferred Pharmacy (with phone number or street name): [facility]   Agent: Please be advised that RX refills may take up to 48 hours. We ask that you follow-up with your pharmacy. >> Apr 08, 2017  4:50 PM Boyd Kerbs wrote: [place] [phone], needs to resend prescription but has to have the directions on prescription in order for medicare to pay

## 2017-04-09 NOTE — Telephone Encounter (Signed)
Rx re-sent w/ Dx code.

## 2017-04-09 NOTE — Telephone Encounter (Signed)
Will route to [facility] pool.

## 2017-04-09 NOTE — Addendum Note (Signed)
Addended byDamita Dunnings D on: 04/09/2017 08:04 AM   Modules accepted: Orders

## 2017-04-10 ENCOUNTER — Other Ambulatory Visit: Payer: Self-pay | Admitting: *Deleted

## 2017-04-10 ENCOUNTER — Telehealth: Payer: Self-pay | Admitting: *Deleted

## 2017-04-10 MED ORDER — ONETOUCH DELICA LANCETS FINE MISC: 1 refills | Status: DC

## 2017-04-10 NOTE — Telephone Encounter (Signed)
Received Physician Orders from [facility]; forwarded to provider/SLS 12/20

## 2017-04-11 DIAGNOSIS — J3089 Other allergic rhinitis: Secondary | ICD-10-CM | POA: Diagnosis not present

## 2017-04-11 DIAGNOSIS — J3081 Allergic rhinitis due to animal (cat) (dog) hair and dander: Secondary | ICD-10-CM | POA: Diagnosis not present

## 2017-04-11 DIAGNOSIS — J301 Allergic rhinitis due to pollen: Secondary | ICD-10-CM | POA: Diagnosis not present

## 2017-04-12 ENCOUNTER — Other Ambulatory Visit: Payer: Self-pay | Admitting: Family Medicine

## 2017-04-12 DIAGNOSIS — E785 Hyperlipidemia, unspecified: Secondary | ICD-10-CM

## 2017-04-16 DIAGNOSIS — J3081 Allergic rhinitis due to animal (cat) (dog) hair and dander: Secondary | ICD-10-CM | POA: Diagnosis not present

## 2017-04-16 DIAGNOSIS — J3089 Other allergic rhinitis: Secondary | ICD-10-CM | POA: Diagnosis not present

## 2017-04-16 DIAGNOSIS — J301 Allergic rhinitis due to pollen: Secondary | ICD-10-CM | POA: Diagnosis not present

## 2017-04-16 DIAGNOSIS — M8589 Other specified disorders of bone density and structure, multiple sites: Secondary | ICD-10-CM | POA: Diagnosis not present

## 2017-04-16 DIAGNOSIS — Z1231 Encounter for screening mammogram for malignant neoplasm of breast: Secondary | ICD-10-CM | POA: Diagnosis not present

## 2017-04-16 LAB — HM MAMMOGRAPHY

## 2017-04-16 LAB — HM DEXA SCAN

## 2017-04-21 DIAGNOSIS — J3081 Allergic rhinitis due to animal (cat) (dog) hair and dander: Secondary | ICD-10-CM | POA: Diagnosis not present

## 2017-04-21 DIAGNOSIS — J3089 Other allergic rhinitis: Secondary | ICD-10-CM | POA: Diagnosis not present

## 2017-04-21 DIAGNOSIS — J301 Allergic rhinitis due to pollen: Secondary | ICD-10-CM | POA: Diagnosis not present

## 2017-04-23 DIAGNOSIS — J3081 Allergic rhinitis due to animal (cat) (dog) hair and dander: Secondary | ICD-10-CM | POA: Diagnosis not present

## 2017-04-23 DIAGNOSIS — J301 Allergic rhinitis due to pollen: Secondary | ICD-10-CM | POA: Diagnosis not present

## 2017-04-23 DIAGNOSIS — J3089 Other allergic rhinitis: Secondary | ICD-10-CM | POA: Diagnosis not present

## 2017-04-24 ENCOUNTER — Telehealth: Payer: Self-pay | Admitting: *Deleted

## 2017-04-24 NOTE — Telephone Encounter (Signed)
Received DEXA results from [facility]; forwarded to provider/SLS 01/03

## 2017-04-25 DIAGNOSIS — J3089 Other allergic rhinitis: Secondary | ICD-10-CM | POA: Diagnosis not present

## 2017-04-25 DIAGNOSIS — J3081 Allergic rhinitis due to animal (cat) (dog) hair and dander: Secondary | ICD-10-CM | POA: Diagnosis not present

## 2017-04-25 DIAGNOSIS — J301 Allergic rhinitis due to pollen: Secondary | ICD-10-CM | POA: Diagnosis not present

## 2017-04-28 DIAGNOSIS — J301 Allergic rhinitis due to pollen: Secondary | ICD-10-CM | POA: Diagnosis not present

## 2017-04-28 DIAGNOSIS — J3081 Allergic rhinitis due to animal (cat) (dog) hair and dander: Secondary | ICD-10-CM | POA: Diagnosis not present

## 2017-04-28 DIAGNOSIS — J3089 Other allergic rhinitis: Secondary | ICD-10-CM | POA: Diagnosis not present

## 2017-04-29 NOTE — Telephone Encounter (Signed)
Faxed refill request received from [employer] [facility] [employer] for One Touch Ultra Test Strips Physician Progress Notes for Medicare; attached OV notes that need to be signed, checked off all requirements given. Paperwork forwarded to provider/SLS 01/08

## 2017-04-30 DIAGNOSIS — J3081 Allergic rhinitis due to animal (cat) (dog) hair and dander: Secondary | ICD-10-CM | POA: Diagnosis not present

## 2017-04-30 DIAGNOSIS — J3089 Other allergic rhinitis: Secondary | ICD-10-CM | POA: Diagnosis not present

## 2017-04-30 DIAGNOSIS — J301 Allergic rhinitis due to pollen: Secondary | ICD-10-CM | POA: Diagnosis not present

## 2017-05-02 DIAGNOSIS — J3089 Other allergic rhinitis: Secondary | ICD-10-CM | POA: Diagnosis not present

## 2017-05-02 DIAGNOSIS — J3081 Allergic rhinitis due to animal (cat) (dog) hair and dander: Secondary | ICD-10-CM | POA: Diagnosis not present

## 2017-05-02 DIAGNOSIS — J301 Allergic rhinitis due to pollen: Secondary | ICD-10-CM | POA: Diagnosis not present

## 2017-05-05 DIAGNOSIS — J3089 Other allergic rhinitis: Secondary | ICD-10-CM | POA: Diagnosis not present

## 2017-05-05 DIAGNOSIS — J3081 Allergic rhinitis due to animal (cat) (dog) hair and dander: Secondary | ICD-10-CM | POA: Diagnosis not present

## 2017-05-05 DIAGNOSIS — J301 Allergic rhinitis due to pollen: Secondary | ICD-10-CM | POA: Diagnosis not present

## 2017-05-07 ENCOUNTER — Encounter: Payer: Self-pay | Admitting: *Deleted

## 2017-05-07 ENCOUNTER — Telehealth: Payer: Self-pay | Admitting: Family Medicine

## 2017-05-07 DIAGNOSIS — J3081 Allergic rhinitis due to animal (cat) (dog) hair and dander: Secondary | ICD-10-CM | POA: Diagnosis not present

## 2017-05-07 DIAGNOSIS — J3089 Other allergic rhinitis: Secondary | ICD-10-CM | POA: Diagnosis not present

## 2017-05-07 DIAGNOSIS — J301 Allergic rhinitis due to pollen: Secondary | ICD-10-CM | POA: Diagnosis not present

## 2017-05-07 NOTE — Telephone Encounter (Signed)
It is the brand that has been recalled.  Do you want to change medication?

## 2017-05-07 NOTE — Telephone Encounter (Signed)
benicar 20 mg #30  1 po qd bp check 2-3 weeks

## 2017-05-07 NOTE — Telephone Encounter (Signed)
Copied from [place] [id number]. Topic: Quick Communication - See Telephone Encounter >> May 07, 2017 10:38 AM Rosalin Hawking wrote: CRM for notification. See Telephone encounter for:  05/07/17.   Pt came in the office stating that have received a letter from her Pharmacy stating that her rx for Losartan Potassium is on recall from the manufactured (pt dropped off letter for provider to see the information) and that pt is needing a new rx. Please advise. (letter given to [facility]) Pt would like new rx to be sent to [facility] off [address].

## 2017-05-07 NOTE — Telephone Encounter (Signed)
Document given to [facility] and not [place].

## 2017-05-08 MED ORDER — OLMESARTAN MEDOXOMIL 20 MG PO TABS: 20.0000 mg | ORAL_TABLET | Freq: Every day | ORAL | 2 refills | Status: DC

## 2017-05-08 NOTE — Telephone Encounter (Signed)
Patient notified, appt made, and rx sent in

## 2017-05-09 DIAGNOSIS — J3089 Other allergic rhinitis: Secondary | ICD-10-CM | POA: Diagnosis not present

## 2017-05-09 DIAGNOSIS — J301 Allergic rhinitis due to pollen: Secondary | ICD-10-CM | POA: Diagnosis not present

## 2017-05-09 DIAGNOSIS — J3081 Allergic rhinitis due to animal (cat) (dog) hair and dander: Secondary | ICD-10-CM | POA: Diagnosis not present

## 2017-05-13 DIAGNOSIS — J3081 Allergic rhinitis due to animal (cat) (dog) hair and dander: Secondary | ICD-10-CM | POA: Diagnosis not present

## 2017-05-13 DIAGNOSIS — J3089 Other allergic rhinitis: Secondary | ICD-10-CM | POA: Diagnosis not present

## 2017-05-13 DIAGNOSIS — J301 Allergic rhinitis due to pollen: Secondary | ICD-10-CM | POA: Diagnosis not present

## 2017-05-13 DIAGNOSIS — H353130 Nonexudative age-related macular degeneration, bilateral, stage unspecified: Secondary | ICD-10-CM | POA: Diagnosis not present

## 2017-05-16 DIAGNOSIS — J3089 Other allergic rhinitis: Secondary | ICD-10-CM | POA: Diagnosis not present

## 2017-05-16 DIAGNOSIS — J301 Allergic rhinitis due to pollen: Secondary | ICD-10-CM | POA: Diagnosis not present

## 2017-05-16 DIAGNOSIS — J3081 Allergic rhinitis due to animal (cat) (dog) hair and dander: Secondary | ICD-10-CM | POA: Diagnosis not present

## 2017-05-19 DIAGNOSIS — J3081 Allergic rhinitis due to animal (cat) (dog) hair and dander: Secondary | ICD-10-CM | POA: Diagnosis not present

## 2017-05-19 DIAGNOSIS — J3089 Other allergic rhinitis: Secondary | ICD-10-CM | POA: Diagnosis not present

## 2017-05-19 DIAGNOSIS — J301 Allergic rhinitis due to pollen: Secondary | ICD-10-CM | POA: Diagnosis not present

## 2017-05-21 DIAGNOSIS — J3089 Other allergic rhinitis: Secondary | ICD-10-CM | POA: Diagnosis not present

## 2017-05-21 DIAGNOSIS — J301 Allergic rhinitis due to pollen: Secondary | ICD-10-CM | POA: Diagnosis not present

## 2017-05-21 DIAGNOSIS — J3081 Allergic rhinitis due to animal (cat) (dog) hair and dander: Secondary | ICD-10-CM | POA: Diagnosis not present

## 2017-05-26 DIAGNOSIS — J3089 Other allergic rhinitis: Secondary | ICD-10-CM | POA: Diagnosis not present

## 2017-05-26 DIAGNOSIS — J301 Allergic rhinitis due to pollen: Secondary | ICD-10-CM | POA: Diagnosis not present

## 2017-05-26 DIAGNOSIS — J3081 Allergic rhinitis due to animal (cat) (dog) hair and dander: Secondary | ICD-10-CM | POA: Diagnosis not present

## 2017-05-29 ENCOUNTER — Telehealth: Payer: Self-pay | Admitting: *Deleted

## 2017-05-29 ENCOUNTER — Ambulatory Visit: Payer: Medicare Other

## 2017-05-29 VITALS — BP 187/74 | HR 92

## 2017-05-29 DIAGNOSIS — J3089 Other allergic rhinitis: Secondary | ICD-10-CM | POA: Diagnosis not present

## 2017-05-29 DIAGNOSIS — I10 Essential (primary) hypertension: Secondary | ICD-10-CM

## 2017-05-29 DIAGNOSIS — J3081 Allergic rhinitis due to animal (cat) (dog) hair and dander: Secondary | ICD-10-CM | POA: Diagnosis not present

## 2017-05-29 DIAGNOSIS — J301 Allergic rhinitis due to pollen: Secondary | ICD-10-CM | POA: Diagnosis not present

## 2017-05-29 MED ORDER — OLMESARTAN MEDOXOMIL 40 MG PO TABS: 40.0000 mg | ORAL_TABLET | Freq: Every day | ORAL | 2 refills | Status: DC

## 2017-05-29 NOTE — Telephone Encounter (Signed)
Patient notified about bone density.  Osteopenia-some improvement Continue calcium + vitamin d and weight bearing exercise.  Recheck in 2 years.

## 2017-05-29 NOTE — Progress Notes (Signed)
Pt came in today for a BP check. Her B/P was 178/78 with a heart rate of 83 in right arm. After several minutes I rechecked with her Left arm and her BP was 187/74 with a heart rate of 92. After consulting with Dr.Lowne-Chase she advised that the Pt increase her Olmesartan from 20mg  po once daily to 40mg  po once daily. Pt also advised to to follow up with Dr.Lowne-Chase for an office visit in 4 to 6 weeks. Pt stated understanding and compliance.

## 2017-06-04 DIAGNOSIS — J3081 Allergic rhinitis due to animal (cat) (dog) hair and dander: Secondary | ICD-10-CM | POA: Diagnosis not present

## 2017-06-04 DIAGNOSIS — J3089 Other allergic rhinitis: Secondary | ICD-10-CM | POA: Diagnosis not present

## 2017-06-04 DIAGNOSIS — J301 Allergic rhinitis due to pollen: Secondary | ICD-10-CM | POA: Diagnosis not present

## 2017-06-06 DIAGNOSIS — J301 Allergic rhinitis due to pollen: Secondary | ICD-10-CM | POA: Diagnosis not present

## 2017-06-06 DIAGNOSIS — J3081 Allergic rhinitis due to animal (cat) (dog) hair and dander: Secondary | ICD-10-CM | POA: Diagnosis not present

## 2017-06-06 DIAGNOSIS — J3089 Other allergic rhinitis: Secondary | ICD-10-CM | POA: Diagnosis not present

## 2017-06-09 DIAGNOSIS — J301 Allergic rhinitis due to pollen: Secondary | ICD-10-CM | POA: Diagnosis not present

## 2017-06-09 DIAGNOSIS — J3089 Other allergic rhinitis: Secondary | ICD-10-CM | POA: Diagnosis not present

## 2017-06-09 DIAGNOSIS — J3081 Allergic rhinitis due to animal (cat) (dog) hair and dander: Secondary | ICD-10-CM | POA: Diagnosis not present

## 2017-06-10 ENCOUNTER — Other Ambulatory Visit: Payer: Self-pay | Admitting: *Deleted

## 2017-06-10 DIAGNOSIS — E039 Hypothyroidism, unspecified: Secondary | ICD-10-CM

## 2017-06-10 MED ORDER — LEVOTHYROXINE SODIUM 100 MCG PO TABS: 100.0000 ug | ORAL_TABLET | Freq: Every day | ORAL | 1 refills | Status: DC

## 2017-06-13 ENCOUNTER — Other Ambulatory Visit: Payer: Self-pay | Admitting: Family Medicine

## 2017-06-13 DIAGNOSIS — J3089 Other allergic rhinitis: Secondary | ICD-10-CM | POA: Diagnosis not present

## 2017-06-13 DIAGNOSIS — J301 Allergic rhinitis due to pollen: Secondary | ICD-10-CM | POA: Diagnosis not present

## 2017-06-13 DIAGNOSIS — J3081 Allergic rhinitis due to animal (cat) (dog) hair and dander: Secondary | ICD-10-CM | POA: Diagnosis not present

## 2017-06-17 DIAGNOSIS — H2511 Age-related nuclear cataract, right eye: Secondary | ICD-10-CM | POA: Diagnosis not present

## 2017-06-17 DIAGNOSIS — H25043 Posterior subcapsular polar age-related cataract, bilateral: Secondary | ICD-10-CM | POA: Diagnosis not present

## 2017-06-17 DIAGNOSIS — H25013 Cortical age-related cataract, bilateral: Secondary | ICD-10-CM | POA: Diagnosis not present

## 2017-06-17 DIAGNOSIS — H18413 Arcus senilis, bilateral: Secondary | ICD-10-CM | POA: Diagnosis not present

## 2017-06-17 DIAGNOSIS — H2513 Age-related nuclear cataract, bilateral: Secondary | ICD-10-CM | POA: Diagnosis not present

## 2017-06-26 ENCOUNTER — Ambulatory Visit: Payer: Medicare Other | Admitting: Family Medicine

## 2017-06-26 ENCOUNTER — Other Ambulatory Visit: Payer: Self-pay

## 2017-06-26 ENCOUNTER — Other Ambulatory Visit: Payer: Medicare Other

## 2017-06-26 DIAGNOSIS — I10 Essential (primary) hypertension: Secondary | ICD-10-CM

## 2017-06-26 MED ORDER — AMLODIPINE BESYLATE 5 MG PO TABS: 5.0000 mg | ORAL_TABLET | Freq: Every day | ORAL | 1 refills | Status: DC

## 2017-06-26 NOTE — Patient Instructions (Signed)
BP still elevated. Let's add amlodipine 5mg  1 tablet daily. Follow-up with Dr. Etter Sjogren in 2-3 weeks, fasting.

## 2017-06-26 NOTE — Assessment & Plan Note (Signed)
Poorly controlled will alter medications, encouraged DASH diet, minimize caffeine and obtain adequate sleep. Report concerning symptoms and follow up as directed and as needed 

## 2017-06-26 NOTE — Progress Notes (Signed)
Pre visit review using our clinic review tool, if applicable. No additional management support is needed unless otherwise documented below in the visit note.  Pt is here today for what was supposed to be a 4-6 week follow-up w/ Dr. Etter Sjogren however she was mistakenly scheduled for another BP check. At the last BP check on 05/29/2017 olmesartan was increased to 40mg  daily.   BP in L arm initial: 167/83  Pulse: 86  BP in R arm several minutes later: 168/74 Pulse: 84  Per Dr. Etter Sjogren BPs still elevated- add amlodipine 5mg  1 tab daily and follow-up with PCP in 2-3 weeks. Fasting labs at visit.   Pt verbalized understanding. She will stop at check-out to schedule appt.    Reviewed and agree Ann Held, DO

## 2017-07-04 DIAGNOSIS — J3081 Allergic rhinitis due to animal (cat) (dog) hair and dander: Secondary | ICD-10-CM | POA: Diagnosis not present

## 2017-07-04 DIAGNOSIS — J3089 Other allergic rhinitis: Secondary | ICD-10-CM | POA: Diagnosis not present

## 2017-07-04 DIAGNOSIS — J301 Allergic rhinitis due to pollen: Secondary | ICD-10-CM | POA: Diagnosis not present

## 2017-07-11 ENCOUNTER — Encounter: Payer: Self-pay | Admitting: Family Medicine

## 2017-07-11 ENCOUNTER — Ambulatory Visit (INDEPENDENT_AMBULATORY_CARE_PROVIDER_SITE_OTHER): Payer: Medicare Other | Admitting: Family Medicine

## 2017-07-11 VITALS — BP 138/66 | HR 92 | Temp 97.6°F | Resp 16 | Ht 62.0 in | Wt 136.0 lb

## 2017-07-11 DIAGNOSIS — I10 Essential (primary) hypertension: Secondary | ICD-10-CM

## 2017-07-11 DIAGNOSIS — E1165 Type 2 diabetes mellitus with hyperglycemia: Secondary | ICD-10-CM

## 2017-07-11 DIAGNOSIS — IMO0002 Reserved for concepts with insufficient information to code with codable children: Secondary | ICD-10-CM

## 2017-07-11 DIAGNOSIS — E1151 Type 2 diabetes mellitus with diabetic peripheral angiopathy without gangrene: Secondary | ICD-10-CM

## 2017-07-11 DIAGNOSIS — E039 Hypothyroidism, unspecified: Secondary | ICD-10-CM

## 2017-07-11 LAB — COMPREHENSIVE METABOLIC PANEL
ALT: 18 U/L (ref 0–35)
AST: 17 U/L (ref 0–37)
Albumin: 4.8 g/dL (ref 3.5–5.2)
Alkaline Phosphatase: 28 U/L — ABNORMAL LOW (ref 39–117)
BILIRUBIN TOTAL: 0.3 mg/dL (ref 0.2–1.2)
BUN: 24 mg/dL — AB (ref 6–23)
CO2: 23 meq/L (ref 19–32)
Calcium: 10 mg/dL (ref 8.4–10.5)
Chloride: 103 mEq/L (ref 96–112)
Creatinine, Ser: 0.97 mg/dL (ref 0.40–1.20)
GFR: 59.71 mL/min — AB (ref >60.00)
GLUCOSE: 120 mg/dL — AB (ref 70–99)
Potassium: 4.8 mEq/L (ref 3.5–5.1)
SODIUM: 140 meq/L (ref 135–145)
Total Protein: 8.3 g/dL (ref 6.0–8.3)

## 2017-07-11 LAB — LIPID PANEL
CHOL/HDL RATIO: 3
Cholesterol: 125 mg/dL (ref 0–200)
HDL: 48.7 mg/dL (ref >39.00)
LDL CALC: 52 mg/dL (ref 0–99)
NonHDL: 75.94
TRIGLYCERIDES: 121 mg/dL (ref 0.0–149.0)
VLDL: 24.2 mg/dL (ref 0.0–40.0)

## 2017-07-11 LAB — HEMOGLOBIN A1C: HEMOGLOBIN A1C: 6.3 % (ref 4.6–6.5)

## 2017-07-11 LAB — TSH: TSH: 0.74 u[IU]/mL (ref 0.35–4.50)

## 2017-07-11 MED ORDER — METOPROLOL SUCCINATE ER 50 MG PO TB24: 50.0000 mg | ORAL_TABLET | Freq: Every day | ORAL | 3 refills | Status: DC

## 2017-07-11 NOTE — Assessment & Plan Note (Signed)
D/c norvasc due to hair loss and start metoprolol rto 2-3 weeks for bp check

## 2017-07-11 NOTE — Patient Instructions (Signed)

## 2017-07-11 NOTE — Assessment & Plan Note (Signed)
Check labs today hgba1c to be checked , minimize simple carbs. Increase exercise as tolerated. Continue current meds  

## 2017-07-11 NOTE — Progress Notes (Signed)
Subjective:  I acted as a Education administrator for [employer]. Cheryl Cisneros, [name]   Patient ID: Cheryl Cisneros, female    DOB: February 20, 1946, 72 y.o.   MRN: [id number]  Chief Complaint  Patient presents with  . Hypertension    HPI  Patient is in today for follow up visit on hypertension.   No complaints except norvasc is causing her hair to fall out .     Patient Care Team: Carollee Herter, Alferd Apa, DO as PCP - General Harold Hedge, Darrick Grinder, MD as Consulting Physician (Allergy and Immunology) Odette Fraction as Consulting Physician (Optometry)   Past Medical History:  Diagnosis Date  . Allergy   . Arthritis   . Asthma   . Borderline glaucoma   . Cataract   . Diabetes ([place])   . GERD (gastroesophageal reflux disease)   . Hypertension   . Macular degeneration 12/25/2011  . Osteopenia   . Thyroid disease    Hypothyroidism       Family History  Problem Relation Age of Onset  . Diabetes Mother   . Hypertension Mother   . Kidney disease Mother        dialysis  . Diabetes Father   . Hypertension Father   . Cancer Father        lung  . Dementia Sister   . Diabetes Sister   . Hyperlipidemia Sister   . Hypertension Sister   . Diabetes Brother   . Hyperlipidemia Brother   . Hypertension Brother   . Diabetes Sister   . Hypertension Sister   . Hyperlipidemia Sister   . Other Sister        blood disorder  . Cervical cancer Daughter   . Cancer Daughter 19       cervical  . Coronary artery disease Unknown   . Lung cancer Unknown   . Colon cancer Neg Hx     Social History   Socioeconomic History  . Marital status: Married    Spouse name: Not on file  . Number of children: Not on file  . Years of education: Not on file  . Highest education level: Not on file  Occupational History  . Occupation: housewife    Employer: UNEMPLOYED  Social Needs  . Financial resource strain: Not on file  . Food insecurity:    Worry: Not on file    Inability: Not on file  . Transportation  needs:    Medical: Not on file    Non-medical: Not on file  Tobacco Use  . Smoking status: Never Smoker  . Smokeless tobacco: Never Used  Substance and Sexual Activity  . Alcohol use: No    Alcohol/week: 0.0 oz  . Drug use: No  . Sexual activity: Yes    Partners: Male  Lifestyle  . Physical activity:    Days per week: Not on file    Minutes per session: Not on file  . Stress: Not on file  Relationships  . Social connections:    Talks on phone: Not on file    Gets together: Not on file    Attends religious service: Not on file    Active member of club or organization: Not on file    Attends meetings of clubs or organizations: Not on file    Relationship status: Not on file  . Intimate partner violence:    Fear of current or ex partner: Not on file    Emotionally abused: Not on file  Physically abused: Not on file    Forced sexual activity: Not on file  Other Topics Concern  . Not on file  Social History Narrative   Exercise--  bootcamp 3 days a week and walking    Outpatient Medications Prior to Visit  Medication Sig Dispense Refill  . alendronate (FOSAMAX) 70 MG tablet TAKE ONE TABLET BY MOUTH ONCE A WEEK IN THE MORNING WITH A FULL GLASS OF WATER, 30 MINUTES BEFORE A MEAL OR BEVERAGE. REMAIN UPRIGHT 12 tablet 3  . aspirin EC 81 MG tablet Take 1 tablet (81 mg total) by mouth daily.    . Biotin 1000 MCG tablet Take 5,000 mcg by mouth 2 (two) times daily.     . Calcium-Vitamin D 600-200 MG-UNIT per tablet Take 1 tablet by mouth 3 (three) times daily with meals.      . Cholecalciferol (VITAMIN D3) 1000 UNITS CAPS Take 1 capsule by mouth daily.      Marland Kitchen EPIPEN 2-PAK 0.3 MG/0.3ML DEVI Reported on 07/26/2015    . EQ ALLERGY RELIEF, CETIRIZINE, 10 MG tablet TAKE ONE TABLET BY MOUTH ONCE DAILY 90 tablet 3  . Garlic Oil (SUPER GARLIC) 1610 MG CAPS Take 1 capsule by mouth daily.     Marland Kitchen glucose blood test strip Check blood sugar once daily as directed 100 each 12  . JANUMET XR 50-1000  MG TB24 TAKE 2 TABLETS BY MOUTH ONCE DAILY 60 tablet 2  . L-Lysine 500 MG TABS Take 1 tablet by mouth daily.      Elmore Guise Devices (ONE TOUCH DELICA LANCING DEV) MISC Use once daily to check blood sugar.  E11.9 1 each 0  . levothyroxine (SYNTHROID, LEVOTHROID) 100 MCG tablet Take 1 tablet (100 mcg total) by mouth daily. 90 tablet 1  . Magnesium 250 MG TABS Take 1 tablet by mouth daily.     . nateglinide (STARLIX) 60 MG tablet TAKE 1 TABLET BY MOUTH THREE TIMES DAILY WITH MEALS 90 tablet 2  . olmesartan (BENICAR) 40 MG tablet Take 1 tablet (40 mg total) by mouth daily. 90 tablet 2  . Omega-3 Fatty Acids (OMEGA 3 PO) Take 2 tablets by mouth daily.     Marland Kitchen omeprazole (PRILOSEC) 40 MG capsule TAKE 1 CAPSULE BY MOUTH ONCE DAILY 90 capsule 2  . ONETOUCH DELICA LANCETS FINE MISC Use as directed once daily.  DX: E11.9 100 each 1  . OVER THE COUNTER MEDICATION Maxi-vision 2 capsules per day    . pravastatin (PRAVACHOL) 10 MG tablet TAKE 1 TABLET BY MOUTH ONCE DAILY 90 tablet 1  . SIMPLY SALINE NA Place 1 spray into the nose daily.      . Turmeric 500 MG CAPS Take 1 capsule by mouth 3 (three) times daily.     . vitamin C (ASCORBIC ACID) 500 MG tablet Take 1,000 mg by mouth daily.    Marland Kitchen amLODipine (NORVASC) 5 MG tablet Take 1 tablet (5 mg total) by mouth daily. 30 tablet 1   No facility-administered medications prior to visit.     Allergies  Allergen Reactions  . Norvasc [Amlodipine Besylate] Other (See Comments)    HAIR LOSS    Review of Systems  Constitutional: Negative for chills, fever and malaise/fatigue.  HENT: Negative for congestion and hearing loss.   Eyes: Negative for discharge.  Respiratory: Negative for cough, sputum production and shortness of breath.   Cardiovascular: Negative for chest pain, palpitations and leg swelling.  Gastrointestinal: Negative for abdominal pain, blood in stool, constipation,  diarrhea, heartburn, nausea and vomiting.  Genitourinary: Negative for dysuria,  frequency, hematuria and urgency.  Musculoskeletal: Negative for back pain, falls and myalgias.  Skin: Negative for rash.  Neurological: Negative for dizziness, sensory change, loss of consciousness, weakness and headaches.  Endo/Heme/Allergies: Negative for environmental allergies. Does not bruise/bleed easily.  Psychiatric/Behavioral: Negative for depression and suicidal ideas. The patient is not nervous/anxious and does not have insomnia.        Objective:    Physical Exam  Constitutional: She is oriented to person, place, and time. She appears well-developed and well-nourished.  HENT:  Head: Normocephalic and atraumatic.  Eyes: Conjunctivae and EOM are normal.  Neck: Normal range of motion. Neck supple. No JVD present. Carotid bruit is not present. No thyromegaly present.  Cardiovascular: Normal rate, regular rhythm and normal heart sounds.  No murmur heard. Pulmonary/Chest: Effort normal and breath sounds normal. No respiratory distress. She has no wheezes. She has no rales. She exhibits no tenderness.  Musculoskeletal: She exhibits no edema.  Neurological: She is alert and oriented to person, place, and time.  Psychiatric: She has a normal mood and affect.  Nursing note and vitals reviewed. Sensory exam of the foot is normal, tested with the monofilament. Good pulses, no lesions or ulcers, good peripheral pulses.  BP 138/66 (BP Location: Left Arm, Patient Position: Sitting, Cuff Size: Normal)   Pulse 92   Temp 97.6 F (36.4 C) (Oral)   Resp 16   Ht 5\' 2"  (1.575 m)   Wt 136 lb (61.7 kg)   SpO2 97%   BMI 24.87 kg/m  Wt Readings from Last 3 Encounters:  07/11/17 136 lb (61.7 kg)  12/03/16 137 lb 4 oz (62.3 kg)  05/28/16 144 lb (65.3 kg)   BP Readings from Last 3 Encounters:  07/11/17 138/66  06/26/17 (!) 168/74  05/29/17 (!) 187/74     Immunization History  Administered Date(s) Administered  . Influenza Split 02/12/2011  . Influenza Whole 02/01/2009  .  Influenza, High Dose Seasonal PF 02/06/2015, 01/04/2016  . Influenza,inj,Quad PF,6+ Mos 12/28/2013  . Pneumococcal Conjugate-13 06/24/2013  . Pneumococcal Polysaccharide-23 12/29/2007, 12/13/2008  . Td 09/16/2001  . Tdap 06/28/2013  . Zoster 11/12/2007    Health Maintenance  Topic Date Due  . HEMOGLOBIN A1C  10/03/2017  . MAMMOGRAM  04/16/2018  . OPHTHALMOLOGY EXAM  06/11/2018  . FOOT EXAM  07/12/2018  . DEXA SCAN  04/17/2019  . COLONOSCOPY  08/08/2020  . TETANUS/TDAP  06/29/2023  . INFLUENZA VACCINE  Completed  . Hepatitis C Screening  Completed  . PNA vac Low Risk Adult  Completed    Lab Results  Component Value Date   WBC 6.0 05/28/2016   HGB 13.8 05/28/2016   HCT 40.4 05/28/2016   PLT 235.0 05/28/2016   GLUCOSE 123 (H) 04/04/2017   CHOL 109 04/04/2017   TRIG 144.0 04/04/2017   HDL 46.20 04/04/2017   LDLCALC 34 04/04/2017   ALT 18 04/04/2017   AST 16 04/04/2017   NA 138 04/04/2017   K 3.8 04/04/2017   CL 102 04/04/2017   CREATININE 1.02 04/04/2017   BUN 20 04/04/2017   CO2 27 04/04/2017   TSH 1.25 12/03/2016   HGBA1C 6.7 (H) 04/04/2017   MICROALBUR 4.9 05/28/2016    Lab Results  Component Value Date   TSH 1.25 12/03/2016   Lab Results  Component Value Date   WBC 6.0 05/28/2016   HGB 13.8 05/28/2016   HCT 40.4 05/28/2016   MCV 92.1 05/28/2016  PLT 235.0 05/28/2016   Lab Results  Component Value Date   NA 138 04/04/2017   K 3.8 04/04/2017   CO2 27 04/04/2017   GLUCOSE 123 (H) 04/04/2017   BUN 20 04/04/2017   CREATININE 1.02 04/04/2017   BILITOT 0.6 04/04/2017   ALKPHOS 27 (L) 04/04/2017   AST 16 04/04/2017   ALT 18 04/04/2017   PROT 7.8 04/04/2017   ALBUMIN 4.7 04/04/2017   CALCIUM 9.3 04/04/2017   GFR 56.38 (L) 04/04/2017   Lab Results  Component Value Date   CHOL 109 04/04/2017   Lab Results  Component Value Date   HDL 46.20 04/04/2017   Lab Results  Component Value Date   LDLCALC 34 04/04/2017   Lab Results  Component Value  Date   TRIG 144.0 04/04/2017   Lab Results  Component Value Date   CHOLHDL 2 04/04/2017   Lab Results  Component Value Date   HGBA1C 6.7 (H) 04/04/2017         Assessment & Plan:   Problem List Items Addressed This Visit      Unprioritized   DM (diabetes mellitus) type II uncontrolled, periph vascular disorder ([place])    Check labs today hgba1c to be checked,  minimize simple carbs. Increase exercise as tolerated. Continue current meds      Relevant Medications   metoprolol succinate (TOPROL-XL) 50 MG 24 hr tablet   Essential hypertension - Primary    D/c norvasc due to hair loss and start metoprolol rto 2-3 weeks for bp check       Relevant Medications   metoprolol succinate (TOPROL-XL) 50 MG 24 hr tablet    Other Visit Diagnoses    Hypothyroidism, unspecified type       Relevant Medications   metoprolol succinate (TOPROL-XL) 50 MG 24 hr tablet   Other Relevant Orders   TSH      I have discontinued Cheryl Cisneros's amLODipine. I am also having her start on metoprolol succinate. Additionally, I am having her maintain her Calcium-Vitamin D, L-Lysine, Magnesium, Omega-3 Fatty Acids (OMEGA 3 PO), Garlic Oil, Vitamin D3, SIMPLY SALINE NA, EPIPEN 2-PAK, Biotin, OVER THE COUNTER MEDICATION, Turmeric, vitamin C, aspirin EC, EQ ALLERGY RELIEF (CETIRIZINE), ONE TOUCH DELICA LANCING DEV, omeprazole, glucose blood, ONETOUCH DELICA LANCETS FINE, pravastatin, alendronate, olmesartan, levothyroxine, nateglinide, and JANUMET XR.  Meds ordered this encounter  Medications  . metoprolol succinate (TOPROL-XL) 50 MG 24 hr tablet    Sig: Take 1 tablet (50 mg total) by mouth daily. Take with or immediately following a meal.    Dispense:  30 tablet    Refill:  3    CMA served as scribe during this visit. History, Physical and Plan performed by medical provider. Documentation and orders reviewed and attested to.  Ann Held, DO

## 2017-07-11 NOTE — Addendum Note (Signed)
Addended by: Caffie Pinto on: 07/11/2017 09:45 AM   Modules accepted: Orders

## 2017-07-23 ENCOUNTER — Telehealth: Payer: Self-pay | Admitting: Family Medicine

## 2017-07-23 NOTE — Telephone Encounter (Signed)
Spoke with Mrs. Cheryl Cisneros today. Patient stated that she is having issues with her blood pressure medication (metoprolol 50 mg). She also stated that her hair is falling out and would like to see if her medication can be changed. Please have message forwarded to Dr. Nonda Lou  assistant. [place]

## 2017-07-24 MED ORDER — LOSARTAN POTASSIUM 50 MG PO TABS: 50.0000 mg | ORAL_TABLET | Freq: Every day | ORAL | 2 refills | Status: DC

## 2017-07-24 NOTE — Telephone Encounter (Signed)
Left detailed message of med change and to call back to make appt for 2-3 weeks for nurse visit to recheck bp

## 2017-07-24 NOTE — Telephone Encounter (Signed)
Losartan 50 mg #30  1 po qd , 2 refills  Nurse visit in 2-3 weeks for bp check

## 2017-07-28 DIAGNOSIS — H2513 Age-related nuclear cataract, bilateral: Secondary | ICD-10-CM | POA: Diagnosis not present

## 2017-07-28 DIAGNOSIS — H2511 Age-related nuclear cataract, right eye: Secondary | ICD-10-CM | POA: Diagnosis not present

## 2017-07-28 HISTORY — PX: EYE SURGERY: SHX253

## 2017-07-29 DIAGNOSIS — H2512 Age-related nuclear cataract, left eye: Secondary | ICD-10-CM | POA: Diagnosis not present

## 2017-08-12 NOTE — Progress Notes (Signed)
Subjective:   Cheryl Cisneros is a 72 y.o. female who presents for Medicare Annual (Subsequent) preventive examination.  Review of Systems: No ROS.  Medicare Wellness Visit. Additional risk factors are reflected in the social history.  Cardiac Risk Factors include: advanced age (>52men, >39 women);diabetes mellitus;hypertension Sleep patterns: Sleeps about 8 hrs.                       Home Safety/Smoke Alarms: Feels safe in home. Smoke alarms in place.  Living environment; residence and Firearm Safety:  Lives with husband in 2 story home.                      Female:       Mammo- utd   .    Dexa scan- utd       CCS-2017. Due 2022     Objective:     Vitals: BP 140/68 (BP Location: Left Arm, Patient Position: Sitting, Cuff Size: Normal)   Pulse 90   Ht 5\' 2"  (1.575 m)   Wt 138 lb (62.6 kg)   SpO2 98%   BMI 25.24 kg/m   Body mass index is 25.24 kg/m.  Advanced Directives 08/15/2017 07/26/2015 06/05/2015  Does Patient Have a Medical Advance Directive? Yes Yes Yes  Type of Paramedic of [place];Living will Living will;Healthcare Power of [place];Living will  Does patient want to make changes to medical advance directive? No - Patient declined - No - Patient declined  Copy of [place] in Chart? No - copy requested - No - copy requested    Tobacco Social History   Tobacco Use  Smoking Status Never Smoker  Smokeless Tobacco Never Used     Counseling given: Not Answered   Clinical Intake: Pain : No/denies pain    Past Medical History:  Diagnosis Date  . Allergy   . Arthritis   . Asthma   . Borderline glaucoma   . Cataract   . Diabetes ([place])   . GERD (gastroesophageal reflux disease)   . Hypertension   . Macular degeneration 12/25/2011  . Osteopenia   . Thyroid disease    Hypothyroidism   Past Surgical History:  Procedure Laterality Date  . ABDOMINAL HYSTERECTOMY  1986   TAH BSO  .  BACK SURGERY  2016  . EYE SURGERY Right 07/28/2017   Dr.Beavis. Cataract removal  . KNEE ARTHROSCOPY Right 2002  . LUMBAR LAMINECTOMY  1999  . ROTATOR CUFF REPAIR Right 02/27/2005  . TONSILLECTOMY  1963   Family History  Problem Relation Age of Onset  . Diabetes Mother   . Hypertension Mother   . Kidney disease Mother        dialysis  . Diabetes Father   . Hypertension Father   . Cancer Father        lung  . Dementia Sister   . Diabetes Sister   . Hyperlipidemia Sister   . Hypertension Sister   . Diabetes Brother   . Hyperlipidemia Brother   . Hypertension Brother   . Diabetes Sister   . Hypertension Sister   . Hyperlipidemia Sister   . Other Sister        blood disorder  . Cervical cancer Daughter   . Cancer Daughter 102       cervical  . Coronary artery disease Unknown   . Lung cancer Unknown   . Colon cancer Neg Hx  Social History   Socioeconomic History  . Marital status: Married    Spouse name: Not on file  . Number of children: Not on file  . Years of education: Not on file  . Highest education level: Not on file  Occupational History  . Occupation: housewife    Employer: UNEMPLOYED  Social Needs  . Financial resource strain: Not on file  . Food insecurity:    Worry: Not on file    Inability: Not on file  . Transportation needs:    Medical: Not on file    Non-medical: Not on file  Tobacco Use  . Smoking status: Never Smoker  . Smokeless tobacco: Never Used  Substance and Sexual Activity  . Alcohol use: No    Alcohol/week: 0.0 oz  . Drug use: No  . Sexual activity: Not Currently    Partners: Male  Lifestyle  . Physical activity:    Days per week: Not on file    Minutes per session: Not on file  . Stress: Not on file  Relationships  . Social connections:    Talks on phone: Not on file    Gets together: Not on file    Attends religious service: Not on file    Active member of club or organization: Not on file    Attends meetings of clubs  or organizations: Not on file    Relationship status: Not on file  Other Topics Concern  . Not on file  Social History Narrative   Exercise--  bootcamp 3 days a week and walking    Outpatient Encounter Medications as of 08/15/2017  Medication Sig  . alendronate (FOSAMAX) 70 MG tablet TAKE ONE TABLET BY MOUTH ONCE A WEEK IN THE MORNING WITH A FULL GLASS OF WATER, 30 MINUTES BEFORE A MEAL OR BEVERAGE. REMAIN UPRIGHT  . aspirin EC 81 MG tablet Take 1 tablet (81 mg total) by mouth daily.  . Biotin 1000 MCG tablet Take 5,000 mcg by mouth 2 (two) times daily.   . Calcium-Vitamin D 600-200 MG-UNIT per tablet Take 1 tablet by mouth 3 (three) times daily with meals.    . Cholecalciferol (VITAMIN D3) 1000 UNITS CAPS Take 1 capsule by mouth daily.    Marland Kitchen EPIPEN 2-PAK 0.3 MG/0.3ML DEVI Reported on 07/26/2015  . EQ ALLERGY RELIEF, CETIRIZINE, 10 MG tablet TAKE ONE TABLET BY MOUTH ONCE DAILY  . Garlic Oil (SUPER GARLIC) 8937 MG CAPS Take 1 capsule by mouth daily.   Marland Kitchen glucose blood test strip Check blood sugar once daily as directed  . JANUMET XR 50-1000 MG TB24 TAKE 2 TABLETS BY MOUTH ONCE DAILY  . L-Lysine 500 MG TABS Take 1 tablet by mouth daily.    Elmore Guise Devices (ONE TOUCH DELICA LANCING DEV) MISC Use once daily to check blood sugar.  E11.9  . levothyroxine (SYNTHROID, LEVOTHROID) 100 MCG tablet Take 1 tablet (100 mcg total) by mouth daily.  Marland Kitchen losartan (COZAAR) 50 MG tablet Take 1 tablet (50 mg total) by mouth daily.  . Magnesium 250 MG TABS Take 1 tablet by mouth daily.   . nateglinide (STARLIX) 60 MG tablet TAKE 1 TABLET BY MOUTH THREE TIMES DAILY WITH MEALS  . olmesartan (BENICAR) 40 MG tablet Take 1 tablet (40 mg total) by mouth daily.  . Omega-3 Fatty Acids (OMEGA 3 PO) Take 2 tablets by mouth daily.   Marland Kitchen omeprazole (PRILOSEC) 40 MG capsule TAKE 1 CAPSULE BY MOUTH ONCE DAILY  . ONETOUCH DELICA LANCETS FINE MISC Use  as directed once daily.  DX: E11.9  . OVER THE COUNTER MEDICATION Maxi-vision 2  capsules per day  . pravastatin (PRAVACHOL) 10 MG tablet TAKE 1 TABLET BY MOUTH ONCE DAILY  . SIMPLY SALINE NA Place 1 spray into the nose daily.    . Turmeric 500 MG CAPS Take 1 capsule by mouth 3 (three) times daily.   . vitamin C (ASCORBIC ACID) 500 MG tablet Take 1,000 mg by mouth daily.  . [DISCONTINUED] metoprolol succinate (TOPROL-XL) 50 MG 24 hr tablet Take 1 tablet (50 mg total) by mouth daily. Take with or immediately following a meal.   No facility-administered encounter medications on file as of 08/15/2017.     Activities of Daily Living In your present state of health, do you have any difficulty performing the following activities: 08/15/2017  Hearing? N  Vision? N  Difficulty concentrating or making decisions? N  Walking or climbing stairs? N  Dressing or bathing? N  Doing errands, shopping? N  Preparing Food and eating ? N  Using the Toilet? N  In the past six months, have you accidently leaked urine? N  Do you have problems with loss of bowel control? N  Managing your Medications? N  Managing your Finances? N  Housekeeping or managing your Housekeeping? N  Some recent data might be hidden    Patient Care Team: Carollee Herter, Alferd Apa, DO as PCP - General Harold Hedge, Darrick Grinder, MD as Consulting Physician (Allergy and Immunology) Odette Fraction as Consulting Physician (Optometry)    Assessment:   This is a routine wellness examination for [place]. Physical assessment deferred to PCP.   Exercise Activities and Dietary recommendations Current Exercise Habits: Structured exercise class, Type of exercise: calisthenics;walking, Time (Minutes): 60, Frequency (Times/Week): 6, Weekly Exercise (Minutes/Week): 360, Intensity: Moderate   Diet (meal preparation, eat out, water intake, caffeinated beverages, dairy products, fruits and vegetables): in general, a "healthy" diet  , well balanced   Goals    . Maintain healthy lifestyle.       Fall Risk Fall Risk   08/15/2017 03/27/2017 11/28/2015 06/05/2015 06/05/2015  Falls in the past year? No Yes No No No  Comment - Emmi Telephone Survey: data to providers prior to load - - -  Number falls in past yr: - 1 - - -  Comment - Emmi Telephone Survey Actual Response = 1 - - -  Injury with Fall? - Yes - - -    Depression Screen PHQ 2/9 Scores 08/15/2017 12/03/2016 11/28/2015 06/05/2015  PHQ - 2 Score 0 0 0 0     Cognitive Function Ad8 score reviewed for issues:  Issues making decisions:no  Less interest in hobbies / activities:no  Repeats questions, stories (family complaining):no  Trouble using ordinary gadgets (microwave, computer, phone):no  Forgets the month or year: no  Mismanaging finances: no  Remembering appts:no  Daily problems with thinking and/or memory:no Ad8 score is=0         Immunization History  Administered Date(s) Administered  . Influenza Split 02/12/2011  . Influenza Whole 02/01/2009  . Influenza, High Dose Seasonal PF 02/06/2015, 01/04/2016  . Influenza,inj,Quad PF,6+ Mos 12/28/2013  . Pneumococcal Conjugate-13 06/24/2013  . Pneumococcal Polysaccharide-23 12/29/2007, 12/13/2008  . Td 09/16/2001  . Tdap 06/28/2013  . Zoster 11/12/2007   Screening Tests Health Maintenance  Topic Date Due  . INFLUENZA VACCINE  11/20/2017  . HEMOGLOBIN A1C  01/11/2018  . MAMMOGRAM  04/16/2018  . OPHTHALMOLOGY EXAM  06/11/2018  . FOOT EXAM  07/12/2018  . DEXA SCAN  04/17/2019  . COLONOSCOPY  08/08/2020  . TETANUS/TDAP  06/29/2023  . Hepatitis C Screening  Completed  . PNA vac Low Risk Adult  Completed    Plan:   Follow up with Dr.Lowne as scheduled 11/10/17.  Continue to eat heart healthy diet (full of fruits, vegetables, whole grains, lean protein, water--limit salt, fat, and sugar intake) and increase physical activity as tolerated.  Continue doing brain stimulating activities (puzzles, reading, adult coloring books, staying active) to keep memory sharp.   Bring a copy of  your living will and/or healthcare power of attorney to your next office visit.    I have personally reviewed and noted the following in the patient's chart:   . Medical and social history . Use of alcohol, tobacco or illicit drugs  . Current medications and supplements . Functional ability and status . Nutritional status . Physical activity . Advanced directives . List of other physicians . Hospitalizations, surgeries, and ER visits in previous 12 months . Vitals . Screenings to include cognitive, depression, and falls . Referrals and appointments  In addition, I have reviewed and discussed with patient certain preventive protocols, quality metrics, and best practice recommendations. A written personalized care plan for preventive services as well as general preventive health recommendations were provided to patient.     Naaman Plummer [place], [place]  08/15/2017

## 2017-08-15 ENCOUNTER — Encounter: Payer: Self-pay | Admitting: *Deleted

## 2017-08-15 ENCOUNTER — Ambulatory Visit (INDEPENDENT_AMBULATORY_CARE_PROVIDER_SITE_OTHER): Payer: Medicare Other | Admitting: *Deleted

## 2017-08-15 VITALS — BP 140/68 | HR 90 | Ht 62.0 in | Wt 138.0 lb

## 2017-08-15 DIAGNOSIS — J3081 Allergic rhinitis due to animal (cat) (dog) hair and dander: Secondary | ICD-10-CM | POA: Diagnosis not present

## 2017-08-15 DIAGNOSIS — Z0000 Encounter for general adult medical examination without abnormal findings: Secondary | ICD-10-CM | POA: Diagnosis not present

## 2017-08-15 DIAGNOSIS — J3089 Other allergic rhinitis: Secondary | ICD-10-CM | POA: Diagnosis not present

## 2017-08-15 DIAGNOSIS — J301 Allergic rhinitis due to pollen: Secondary | ICD-10-CM | POA: Diagnosis not present

## 2017-08-15 NOTE — Patient Instructions (Signed)
Follow up with Dr.Lowne as scheduled 11/10/17.  Continue to eat heart healthy diet (full of fruits, vegetables, whole grains, lean protein, water--limit salt, fat, and sugar intake) and increase physical activity as tolerated.  Continue doing brain stimulating activities (puzzles, reading, adult coloring books, staying active) to keep memory sharp.   Bring a copy of your living will and/or healthcare power of attorney to your next office visit.   Cheryl Cisneros , Thank you for taking time to come for your Medicare Wellness Visit. I appreciate your ongoing commitment to your health goals. Please review the following plan we discussed and let me know if I can assist you in the future.   These are the goals we discussed: Goals    . Maintain healthy lifestyle.       This is a list of the screening recommended for you and due dates:  Health Maintenance  Topic Date Due  . Flu Shot  11/20/2017  . Hemoglobin A1C  01/11/2018  . Mammogram  04/16/2018  . Eye exam for diabetics  06/11/2018  . Complete foot exam   07/12/2018  . DEXA scan (bone density measurement)  04/17/2019  . Colon Cancer Screening  08/08/2020  . Tetanus Vaccine  06/29/2023  .  Hepatitis C: One time screening is recommended by Center for Disease Control  (CDC) for  adults born from [birth year] through 1967.   Completed  . Pneumonia vaccines  Completed    Health Maintenance for Postmenopausal Women Menopause is a normal process in which your reproductive ability comes to an end. This process happens gradually over a span of months to years, usually between the ages of 7 and 34. Menopause is complete when you have missed 12 consecutive menstrual periods. It is important to talk with your health care provider about some of the most common conditions that affect postmenopausal women, such as heart disease, cancer, and bone loss (osteoporosis). Adopting a healthy lifestyle and getting preventive care can help to promote your health and  wellness. Those actions can also lower your chances of developing some of these common conditions. What should I know about menopause? During menopause, you may experience a number of symptoms, such as:  Moderate-to-severe hot flashes.  Night sweats.  Decrease in sex drive.  Mood swings.  Headaches.  Tiredness.  Irritability.  Memory problems.  Insomnia.  Choosing to treat or not to treat menopausal changes is an individual decision that you make with your health care provider. What should I know about hormone replacement therapy and supplements? Hormone therapy products are effective for treating symptoms that are associated with menopause, such as hot flashes and night sweats. Hormone replacement carries certain risks, especially as you become older. If you are thinking about using estrogen or estrogen with progestin treatments, discuss the benefits and risks with your health care provider. What should I know about heart disease and stroke? Heart disease, heart attack, and stroke become more likely as you age. This may be due, in part, to the hormonal changes that your body experiences during menopause. These can affect how your body processes dietary fats, triglycerides, and cholesterol. Heart attack and stroke are both medical emergencies. There are many things that you can do to help prevent heart disease and stroke:  Have your blood pressure checked at least every 1-2 years. High blood pressure causes heart disease and increases the risk of stroke.  If you are 55-15 years old, ask your health care provider if you should take aspirin to prevent  a heart attack or a stroke.  Do not use any tobacco products, including cigarettes, chewing tobacco, or electronic cigarettes. If you need help quitting, ask your health care provider.  It is important to eat a healthy diet and maintain a healthy weight. ? Be sure to include plenty of vegetables, fruits, low-fat dairy products, and  lean protein. ? Avoid eating foods that are high in solid fats, added sugars, or salt (sodium).  Get regular exercise. This is one of the most important things that you can do for your health. ? Try to exercise for at least 150 minutes each week. The type of exercise that you do should increase your heart rate and make you sweat. This is known as moderate-intensity exercise. ? Try to do strengthening exercises at least twice each week. Do these in addition to the moderate-intensity exercise.  Know your numbers.Ask your health care provider to check your cholesterol and your blood glucose. Continue to have your blood tested as directed by your health care provider.  What should I know about cancer screening? There are several types of cancer. Take the following steps to reduce your risk and to catch any cancer development as early as possible. Breast Cancer  Practice breast self-awareness. ? This means understanding how your breasts normally appear and feel. ? It also means doing regular breast self-exams. Let your health care provider know about any changes, no matter how small.  If you are 37 or older, have a clinician do a breast exam (clinical breast exam or CBE) every year. Depending on your age, family history, and medical history, it may be recommended that you also have a yearly breast X-ray (mammogram).  If you have a family history of breast cancer, talk with your health care provider about genetic screening.  If you are at high risk for breast cancer, talk with your health care provider about having an MRI and a mammogram every year.  Breast cancer (BRCA) gene test is recommended for women who have family members with BRCA-related cancers. Results of the assessment will determine the need for genetic counseling and BRCA1 and for BRCA2 testing. BRCA-related cancers include these types: ? Breast. This occurs in males or females. ? Ovarian. ? Tubal. This may also be called fallopian  tube cancer. ? Cancer of the abdominal or pelvic lining (peritoneal cancer). ? Prostate. ? Pancreatic.  Cervical, Uterine, and Ovarian Cancer Your health care provider may recommend that you be screened regularly for cancer of the pelvic organs. These include your ovaries, uterus, and vagina. This screening involves a pelvic exam, which includes checking for microscopic changes to the surface of your cervix (Pap test).  For women ages 21-65, health care providers may recommend a pelvic exam and a Pap test every three years. For women ages 65-65, they may recommend the Pap test and pelvic exam, combined with testing for human papilloma virus (HPV), every five years. Some types of HPV increase your risk of cervical cancer. Testing for HPV may also be done on women of any age who have unclear Pap test results.  Other health care providers may not recommend any screening for nonpregnant women who are considered low risk for pelvic cancer and have no symptoms. Ask your health care provider if a screening pelvic exam is right for you.  If you have had past treatment for cervical cancer or a condition that could lead to cancer, you need Pap tests and screening for cancer for at least 20 years after your  treatment. If Pap tests have been discontinued for you, your risk factors (such as having a new sexual partner) need to be reassessed to determine if you should start having screenings again. Some women have medical problems that increase the chance of getting cervical cancer. In these cases, your health care provider may recommend that you have screening and Pap tests more often.  If you have a family history of uterine cancer or ovarian cancer, talk with your health care provider about genetic screening.  If you have vaginal bleeding after reaching menopause, tell your health care provider.  There are currently no reliable tests available to screen for ovarian cancer.  Lung Cancer Lung cancer  screening is recommended for adults 35-106 years old who are at high risk for lung cancer because of a history of smoking. A yearly low-dose CT scan of the lungs is recommended if you:  Currently smoke.  Have a history of at least 30 pack-years of smoking and you currently smoke or have quit within the past 15 years. A pack-year is smoking an average of one pack of cigarettes per day for one year.  Yearly screening should:  Continue until it has been 15 years since you quit.  Stop if you develop a health problem that would prevent you from having lung cancer treatment.  Colorectal Cancer  This type of cancer can be detected and can often be prevented.  Routine colorectal cancer screening usually begins at age 24 and continues through age 69.  If you have risk factors for colon cancer, your health care provider may recommend that you be screened at an earlier age.  If you have a family history of colorectal cancer, talk with your health care provider about genetic screening.  Your health care provider may also recommend using home test kits to check for hidden blood in your stool.  A small camera at the end of a tube can be used to examine your colon directly (sigmoidoscopy or colonoscopy). This is done to check for the earliest forms of colorectal cancer.  Direct examination of the colon should be repeated every 5-10 years until age 70. However, if early forms of precancerous polyps or small growths are found or if you have a family history or genetic risk for colorectal cancer, you may need to be screened more often.  Skin Cancer  Check your skin from head to toe regularly.  Monitor any moles. Be sure to tell your health care provider: ? About any new moles or changes in moles, especially if there is a change in a mole's shape or color. ? If you have a mole that is larger than the size of a pencil eraser.  If any of your family members has a history of skin cancer, especially at a  young age, talk with your health care provider about genetic screening.  Always use sunscreen. Apply sunscreen liberally and repeatedly throughout the day.  Whenever you are outside, protect yourself by wearing long sleeves, pants, a wide-brimmed hat, and sunglasses.  What should I know about osteoporosis? Osteoporosis is a condition in which bone destruction happens more quickly than new bone creation. After menopause, you may be at an increased risk for osteoporosis. To help prevent osteoporosis or the bone fractures that can happen because of osteoporosis, the following is recommended:  If you are 11-60 years old, get at least 1,000 mg of calcium and at least 600 mg of vitamin D per day.  If you are older than age  5 but younger than age 56, get at least 1,200 mg of calcium and at least 600 mg of vitamin D per day.  If you are older than age 4, get at least 1,200 mg of calcium and at least 800 mg of vitamin D per day.  Smoking and excessive alcohol intake increase the risk of osteoporosis. Eat foods that are rich in calcium and vitamin D, and do weight-bearing exercises several times each week as directed by your health care provider. What should I know about how menopause affects my mental health? Depression may occur at any age, but it is more common as you become older. Common symptoms of depression include:  Low or sad mood.  Changes in sleep patterns.  Changes in appetite or eating patterns.  Feeling an overall lack of motivation or enjoyment of activities that you previously enjoyed.  Frequent crying spells.  Talk with your health care provider if you think that you are experiencing depression. What should I know about immunizations? It is important that you get and maintain your immunizations. These include:  Tetanus, diphtheria, and pertussis (Tdap) booster vaccine.  Influenza every year before the flu season begins.  Pneumonia vaccine.  Shingles vaccine.  Your  health care provider may also recommend other immunizations. This information is not intended to replace advice given to you by your health care provider. Make sure you discuss any questions you have with your health care provider. Document Released: 05/31/2005 Document Revised: 10/27/2015 Document Reviewed: 01/10/2015 Elsevier Interactive Patient Education  2018 [employer].

## 2017-08-18 DIAGNOSIS — H2512 Age-related nuclear cataract, left eye: Secondary | ICD-10-CM | POA: Diagnosis not present

## 2017-08-18 DIAGNOSIS — H2513 Age-related nuclear cataract, bilateral: Secondary | ICD-10-CM | POA: Diagnosis not present

## 2017-08-20 DIAGNOSIS — J3089 Other allergic rhinitis: Secondary | ICD-10-CM | POA: Diagnosis not present

## 2017-08-20 DIAGNOSIS — J301 Allergic rhinitis due to pollen: Secondary | ICD-10-CM | POA: Diagnosis not present

## 2017-08-20 DIAGNOSIS — J3081 Allergic rhinitis due to animal (cat) (dog) hair and dander: Secondary | ICD-10-CM | POA: Diagnosis not present

## 2017-08-25 DIAGNOSIS — J3081 Allergic rhinitis due to animal (cat) (dog) hair and dander: Secondary | ICD-10-CM | POA: Diagnosis not present

## 2017-08-25 DIAGNOSIS — J3089 Other allergic rhinitis: Secondary | ICD-10-CM | POA: Diagnosis not present

## 2017-08-26 DIAGNOSIS — H2513 Age-related nuclear cataract, bilateral: Secondary | ICD-10-CM | POA: Diagnosis not present

## 2017-09-01 ENCOUNTER — Other Ambulatory Visit: Payer: Self-pay | Admitting: Family Medicine

## 2017-09-04 ENCOUNTER — Telehealth: Payer: Self-pay | Admitting: Family Medicine

## 2017-09-04 NOTE — Telephone Encounter (Signed)
Copied from [facility] #[id number]. Topic: Quick Communication - See Telephone Encounter >> Sep 04, 2017  5:51 PM Ivar Drape wrote: CRM for notification. See Telephone encounter for: 09/04/17. Patient said she cannot afford the JANUMET XR 50-1000 MG TB24 medication.  She wants to know if there is something else she can be put on.

## 2017-09-06 ENCOUNTER — Other Ambulatory Visit: Payer: Self-pay | Admitting: Family Medicine

## 2017-09-08 NOTE — Telephone Encounter (Signed)
Doe she have a copy of her drug formulary at home---  ?  It would help if we could see that In the meantime -- stay on the metformin

## 2017-09-09 NOTE — Telephone Encounter (Signed)
Left detailed message for patient to get formulary and to continue to take metformin.

## 2017-09-11 ENCOUNTER — Other Ambulatory Visit: Payer: Self-pay | Admitting: Family Medicine

## 2017-09-11 DIAGNOSIS — E1151 Type 2 diabetes mellitus with diabetic peripheral angiopathy without gangrene: Secondary | ICD-10-CM

## 2017-09-12 ENCOUNTER — Encounter: Payer: Self-pay | Admitting: *Deleted

## 2017-09-17 DIAGNOSIS — J301 Allergic rhinitis due to pollen: Secondary | ICD-10-CM | POA: Diagnosis not present

## 2017-09-17 DIAGNOSIS — J3081 Allergic rhinitis due to animal (cat) (dog) hair and dander: Secondary | ICD-10-CM | POA: Diagnosis not present

## 2017-09-17 DIAGNOSIS — J3089 Other allergic rhinitis: Secondary | ICD-10-CM | POA: Diagnosis not present

## 2017-10-10 ENCOUNTER — Other Ambulatory Visit: Payer: Self-pay | Admitting: Family Medicine

## 2017-10-10 DIAGNOSIS — E785 Hyperlipidemia, unspecified: Secondary | ICD-10-CM

## 2017-10-17 ENCOUNTER — Other Ambulatory Visit: Payer: Self-pay | Admitting: *Deleted

## 2017-10-17 MED ORDER — ONETOUCH DELICA LANCETS 33G MISC: 1 refills | Status: DC

## 2017-10-20 ENCOUNTER — Other Ambulatory Visit: Payer: Self-pay | Admitting: *Deleted

## 2017-10-20 MED ORDER — CETIRIZINE HCL 10 MG PO TABS: 10.0000 mg | ORAL_TABLET | Freq: Every day | ORAL | 3 refills | Status: DC

## 2017-10-22 DIAGNOSIS — J301 Allergic rhinitis due to pollen: Secondary | ICD-10-CM | POA: Diagnosis not present

## 2017-10-22 DIAGNOSIS — J3089 Other allergic rhinitis: Secondary | ICD-10-CM | POA: Diagnosis not present

## 2017-10-22 DIAGNOSIS — J3081 Allergic rhinitis due to animal (cat) (dog) hair and dander: Secondary | ICD-10-CM | POA: Diagnosis not present

## 2017-10-28 ENCOUNTER — Other Ambulatory Visit: Payer: Self-pay | Admitting: Family Medicine

## 2017-11-10 ENCOUNTER — Encounter: Payer: Self-pay | Admitting: Family Medicine

## 2017-11-10 ENCOUNTER — Ambulatory Visit (INDEPENDENT_AMBULATORY_CARE_PROVIDER_SITE_OTHER): Payer: Medicare Other | Admitting: Family Medicine

## 2017-11-10 VITALS — BP 147/82 | HR 93 | Temp 98.2°F | Ht 62.0 in | Wt 138.8 lb

## 2017-11-10 DIAGNOSIS — E1139 Type 2 diabetes mellitus with other diabetic ophthalmic complication: Secondary | ICD-10-CM | POA: Diagnosis not present

## 2017-11-10 DIAGNOSIS — E039 Hypothyroidism, unspecified: Secondary | ICD-10-CM

## 2017-11-10 DIAGNOSIS — E1165 Type 2 diabetes mellitus with hyperglycemia: Secondary | ICD-10-CM

## 2017-11-10 DIAGNOSIS — E785 Hyperlipidemia, unspecified: Secondary | ICD-10-CM | POA: Diagnosis not present

## 2017-11-10 DIAGNOSIS — E1151 Type 2 diabetes mellitus with diabetic peripheral angiopathy without gangrene: Secondary | ICD-10-CM

## 2017-11-10 DIAGNOSIS — E1169 Type 2 diabetes mellitus with other specified complication: Secondary | ICD-10-CM | POA: Diagnosis not present

## 2017-11-10 DIAGNOSIS — I10 Essential (primary) hypertension: Secondary | ICD-10-CM

## 2017-11-10 DIAGNOSIS — D229 Melanocytic nevi, unspecified: Secondary | ICD-10-CM

## 2017-11-10 DIAGNOSIS — IMO0002 Reserved for concepts with insufficient information to code with codable children: Secondary | ICD-10-CM

## 2017-11-10 LAB — COMPREHENSIVE METABOLIC PANEL
ALBUMIN: 4.7 g/dL (ref 3.5–5.2)
ALT: 21 U/L (ref 0–35)
AST: 14 U/L (ref 0–37)
Alkaline Phosphatase: 30 U/L — ABNORMAL LOW (ref 39–117)
BUN: 23 mg/dL (ref 6–23)
CALCIUM: 9.9 mg/dL (ref 8.4–10.5)
CHLORIDE: 100 meq/L (ref 96–112)
CO2: 27 mEq/L (ref 19–32)
CREATININE: 1.1 mg/dL (ref 0.40–1.20)
GFR: 51.59 mL/min — AB (ref >60.00)
Glucose, Bld: 139 mg/dL — ABNORMAL HIGH (ref 70–99)
POTASSIUM: 4.1 meq/L (ref 3.5–5.1)
Sodium: 138 mEq/L (ref 135–145)
Total Bilirubin: 0.4 mg/dL (ref 0.2–1.2)
Total Protein: 7.7 g/dL (ref 6.0–8.3)

## 2017-11-10 LAB — LIPID PANEL
Cholesterol: 139 mg/dL (ref 0–200)
HDL: 43.5 mg/dL (ref >39.00)
LDL CALC: 64 mg/dL (ref 0–99)
NONHDL: 95.37
TRIGLYCERIDES: 158 mg/dL — AB (ref 0.0–149.0)
Total CHOL/HDL Ratio: 3
VLDL: 31.6 mg/dL (ref 0.0–40.0)

## 2017-11-10 LAB — HEMOGLOBIN A1C: Hgb A1c MFr Bld: 6.8 % — ABNORMAL HIGH (ref 4.6–6.5)

## 2017-11-10 LAB — MICROALBUMIN / CREATININE URINE RATIO
CREATININE, U: 18.3 mg/dL
MICROALB/CREAT RATIO: 3.8 mg/g (ref 0.0–30.0)

## 2017-11-10 MED ORDER — LEVOTHYROXINE SODIUM 100 MCG PO TABS: 100.0000 ug | ORAL_TABLET | Freq: Every day | ORAL | 1 refills | Status: DC

## 2017-11-10 MED ORDER — NATEGLINIDE 60 MG PO TABS: 60.0000 mg | ORAL_TABLET | Freq: Three times a day (TID) | ORAL | 1 refills | Status: DC

## 2017-11-10 NOTE — Progress Notes (Signed)
Patient ID: Cheryl Cisneros, female    DOB: [birth year]  Age: 72 y.o. MRN: [id number]    Subjective:  Subjective  HPI Cheryl Cisneros presents for f/u bp, chol and DM   No complaints  HYPERTENSION   Blood pressure range-130/70--149/80  Chest pain- no      Dyspnea- no Lightheadedness- no   Edema- no  Other side effects - no   Medication compliance: good Low salt diet- yes    DIABETES    Blood Sugar ranges-139-211  Polyuria- no New Visual problems- no  Hypoglycemic symptoms- no  Other side effects-no Medication compliance - good Last eye exam- recent  Foot exam- today   HYPERLIPIDEMIA  Medication compliance- good RUQ pain- no  Muscle aches- no Other side effects-no   Review of Systems  Constitutional: Negative for fever.  HENT: Negative for congestion.   Respiratory: Negative for shortness of breath.   Cardiovascular: Negative for chest pain, palpitations and leg swelling.  Gastrointestinal: Negative for abdominal pain, blood in stool and nausea.  Genitourinary: Negative for dysuria and frequency.  Skin: Negative for rash.  Allergic/Immunologic: Negative for environmental allergies.  Neurological: Negative for dizziness and headaches.  Psychiatric/Behavioral: The patient is not nervous/anxious.     History Past Medical History:  Diagnosis Date  . Allergy   . Arthritis   . Asthma   . Borderline glaucoma   . Cataract   . Diabetes ([place])   . GERD (gastroesophageal reflux disease)   . Hypertension   . Macular degeneration 12/25/2011  . Osteopenia   . Thyroid disease    Hypothyroidism    She has a past surgical history that includes Tonsillectomy (1963); Knee arthroscopy (Right, 2002); Lumbar laminectomy (1999); Rotator cuff repair (Right, 02/27/2005); Abdominal hysterectomy (1986); Back surgery (2016); and Eye surgery (Right, 07/28/2017).   Her family history includes Cancer in her father; Cancer (age of onset: 75) in her daughter; Cervical cancer in her  daughter; Coronary artery disease in her unknown relative; Dementia in her sister; Diabetes in her brother, father, mother, sister, and sister; Hyperlipidemia in her brother, sister, and sister; Hypertension in her brother, father, mother, sister, and sister; Kidney disease in her mother; Lung cancer in her unknown relative; Other in her sister.She reports that she has never smoked. She has never used smokeless tobacco. She reports that she does not drink alcohol or use drugs.  Current Outpatient Medications on File Prior to Visit  Medication Sig Dispense Refill  . alendronate (FOSAMAX) 70 MG tablet TAKE ONE TABLET BY MOUTH ONCE A WEEK IN THE MORNING WITH A FULL GLASS OF WATER, 30 MINUTES BEFORE A MEAL OR BEVERAGE. REMAIN UPRIGHT 12 tablet 3  . aspirin EC 81 MG tablet Take 1 tablet (81 mg total) by mouth daily.    . Biotin 1000 MCG tablet Take 5,000 mcg by mouth 2 (two) times daily.     . Calcium-Vitamin D 600-200 MG-UNIT per tablet Take 1 tablet by mouth 3 (three) times daily with meals.      . cetirizine (EQ ALLERGY RELIEF, CETIRIZINE,) 10 MG tablet Take 1 tablet (10 mg total) by mouth daily. 90 tablet 3  . Cholecalciferol (VITAMIN D3) 1000 UNITS CAPS Take 1 capsule by mouth daily.      Marland Kitchen EPIPEN 2-PAK 0.3 MG/0.3ML DEVI Reported on 07/26/2015    . Garlic Oil (SUPER GARLIC) 9211 MG CAPS Take 1 capsule by mouth daily.     Marland Kitchen glucose blood test strip Check blood sugar once daily as directed  100 each 12  . L-Lysine 500 MG TABS Take 1 tablet by mouth daily.      El[employer] Gu[employer] Devices (ONE TOUCH DELICA LANCING DEV) MISC Use once daily to check blood sugar.  E11.9 1 each 0  . Magnesium 250 MG TABS Take 1 tablet by mouth daily.     . metFORMIN (GLUCOPHAGE) 1000 MG tablet TAKE ONE TABLET BY MOUTH TWICE DAILY WITH MEALS 180 tablet 1  . olmesartan (BENICAR) 40 MG tablet Take 1 tablet (40 mg total) by mouth daily. 90 tablet 2  . Omega-3 Fatty Acids (OMEGA 3 PO) Take 2 tablets by mouth daily.     Marland Kitchen omeprazole  (PRILOSEC) 40 MG capsule TAKE 1 CAPSULE BY MOUTH ONCE DAILY 90 capsule 2  . ONETOUCH DELICA LANCETS 57D MISC USE   TO CHECK GLUCOSE ONCE DAILY AS DIRECTED.  DX: E11.51 100 each 1  . OVER THE COUNTER MEDICATION Maxi-vision 2 capsules per day    . pravastatin (PRAVACHOL) 10 MG tablet TAKE 1 TABLET BY MOUTH ONCE DAILY 90 tablet 1  . SIMPLY SALINE NA Place 1 spray into the nose daily.      . Turmeric 500 MG CAPS Take 1 capsule by mouth 3 (three) times daily.     . vitamin C (ASCORBIC ACID) 500 MG tablet Take 1,000 mg by mouth daily.     No current facility-administered medications on file prior to visit.      Objective:  Objective  Physical Exam  Constitutional: She is oriented to person, place, and time. She appears well-developed and well-nourished.  HENT:  Head: Normocephalic and atraumatic.  Eyes: Conjunctivae and EOM are normal.  Neck: Normal range of motion. Neck supple. No JVD present. Carotid bruit is not present. No thyromegaly present.  Cardiovascular: Normal rate, regular rhythm and normal heart sounds.  No murmur heard. Pulmonary/Chest: Effort normal and breath sounds normal. No respiratory distress. She has no wheezes. She has no rales. She exhibits no tenderness.  Musculoskeletal: She exhibits no edema.  Neurological: She is alert and oriented to person, place, and time.  Psychiatric: She has a normal mood and affect.  Nursing note and vitals reviewed.  Diabetic Foot Exam - Simple   Simple Foot Form Diabetic Foot exam was performed with the following findings:  Yes 11/10/2017 10:22 AM  Visual Inspection No deformities, no ulcerations, no other skin breakdown bilaterally:  Yes Sensation Testing Intact to touch and monofilament testing bilaterally:  Yes Pulse Check Posterior Tibialis and Dorsalis pulse intact bilaterally:  Yes Comments     BP (!) 147/82   Pulse 93   Temp 98.2 F (36.8 C) (Oral)   Ht 5\' 2"  (1.575 m)   Wt 138 lb 12.8 oz (63 kg)   SpO2 98%   BMI  25.39 kg/m  Wt Readings from Last 3 Encounters:  11/10/17 138 lb 12.8 oz (63 kg)  08/15/17 138 lb (62.6 kg)  07/11/17 136 lb (61.7 kg)     Lab Results  Component Value Date   WBC 6.0 05/28/2016   HGB 13.8 05/28/2016   HCT 40.4 05/28/2016   PLT 235.0 05/28/2016   GLUCOSE 139 (H) 11/10/2017   CHOL 139 11/10/2017   TRIG 158.0 (H) 11/10/2017   HDL 43.50 11/10/2017   LDLCALC 64 11/10/2017   ALT 21 11/10/2017   AST 14 11/10/2017   NA 138 11/10/2017   K 4.1 11/10/2017   CL 100 11/10/2017   CREATININE 1.10 11/10/2017   BUN 23 11/10/2017  CO2 27 11/10/2017   TSH 0.74 07/11/2017   HGBA1C 6.8 (H) 11/10/2017   MICROALBUR <0.7 11/10/2017    Mr Lumbar Spine Wo Contrast  Result Date: 12/02/2015 CLINICAL DATA:  Initial evaluation for two-month history of low back pain, radiating into left lower extremity. Worse with sitting. History of prior surgery. EXAM: MRI LUMBAR SPINE WITHOUT CONTRAST TECHNIQUE: Multiplanar, multisequence MR imaging of the lumbar spine was performed. No intravenous contrast was administered. COMPARISON:  Prior MRI from 07/23/2014. FINDINGS: Segmentation: Normal segmentation. Lowest well-formed this is presumed to be the L5-S1 level. Alignment: Stable alignment with preservation of the normal lumbar lordosis. No listhesis or subluxation. Vertebrae: Vertebral body heights are maintained. No acute or chronic fracture. Signal intensity within the vertebral body bone marrow is normal. Benign hemangioma noted within the L4 vertebral body. No marrow edema. Conus medullaris: Extends to the L1 level and appears normal. Paraspinal and other soft tissues: Paraspinous soft tissues demonstrate no acute abnormality. Visualized visceral structures within normal limits. No retroperitoneal adenopathy. Intra-abdominal aorta of normal caliber. Disc levels: No significant degenerative changes are seen from the T11-12 through the L2-3 levels. L3-4: Diffuse disc bulge with disc desiccation.  Superimposed shallow right foraminal disc protrusion abutting the exiting left L3 nerve root as it courses out of the left neural foramen (series 5, image 17). Overall, this is similar to previous study. Superimposed mild facet and ligamentum flavum hypertrophy. No significant canal or foraminal encroachment. L4-5: Interval performance of a decompressive left hemi laminectomy has been performed at this level since the previous exam. Persistent disc bulge with disc desiccation and intervertebral disc space narrowing. There is a persistent left subarticular disc protrusion extending into the left lateral recess, potentially affecting the left L5 nerve root. This appears slightly more prominent as compared to prior study, and is suspicious for possible recurrent disc herniation. Disc bulge again extends into the left neural foramen without frank impingement of the left L4 nerve root. Superimposed facet arthrosis with mild ligamentum flavum hypertrophy. No significant canal stenosis. Minimal right foraminal encroachment related to disc bulge and facet disease. L5-S1: Remote left laminectomy defect again noted. Degenerative disc desiccation with intervertebral disc space narrowing and diffuse disc bulge. Superimposed osteophytic endplate spurring. Mild left-sided facet arthrosis. Previously noted fibrosis surrounding the S1 nerve root not well appreciated on this noncontrast examination, but is likely unchanged. No significant canal stenosis. Mild left foraminal stenosis, stable. IMPRESSION: 1. Left subarticular disc protrusion, slightly more prominent as compared to preoperative MRI from 2016, and suspicious for possible recurrent disc herniation. This potentially affects the transiting left L5 nerve root in the left lateral recess. Postoperative sequela from interval decompressive left hemi laminectomy at this level. 2. Stable small left extra foraminal disc protrusion, contacting the exiting left L3 nerve root. 3.  Stable postsurgical changes on the left at L5-S1 without new or recurrent stenosis. Electronically Signed   By: Jeannine Boga M.D.   On: 12/02/2015 22:18     Assessment & Plan:  Plan  I have discontinued Cheryl Cisneros's losartan. I have also changed her nateglinide. Additionally, I am having her maintain her Calcium-Vitamin D, L-Lysine, Magnesium, Omega-3 Fatty Acids (OMEGA 3 PO), Garlic Oil, Vitamin D3, SIMPLY SALINE NA, EPIPEN 2-PAK, Biotin, OVER THE COUNTER MEDICATION, Turmeric, vitamin C, aspirin EC, ONE TOUCH DELICA LANCING DEV, omeprazole, glucose blood, alendronate, olmesartan, metFORMIN, pravastatin, ONETOUCH DELICA LANCETS 96E, cetirizine, and levothyroxine.  Meds ordered this encounter  Medications  . nateglinide (STARLIX) 60 MG tablet  Sig: Take 1 tablet (60 mg total) by mouth 3 (three) times daily with meals.    Dispense:  90 tablet    Refill:  1    Please consider 90 day supplies to promote better adherence  . levothyroxine (SYNTHROID, LEVOTHROID) 100 MCG tablet    Sig: Take 1 tablet (100 mcg total) by mouth daily.    Dispense:  90 tablet    Refill:  1    Problem List Items Addressed This Visit      Unprioritized   DM (diabetes mellitus) type II uncontrolled, periph vascular disorder ([place])    hgba1c to be checked  minimize simple carbs. Increase exercise as tolerated. Continue current meds       Relevant Medications   nateglinide (STARLIX) 60 MG tablet   Essential hypertension    Well controlled, no changes to meds. Encouraged heart healthy diet such as the DASH diet and exercise as tolerated.       Relevant Orders   Lipid panel (Completed)   Hemoglobin A1c (Completed)   Comprehensive metabolic panel (Completed)   Microalbumin / creatinine urine ratio (Completed)   POCT Urinalysis Dipstick (Automated)   Hypothyroidism    con't meds Check labs      Relevant Medications   levothyroxine (SYNTHROID, LEVOTHROID) 100 MCG tablet   Other Relevant  Orders   Lipid panel (Completed)   Hemoglobin A1c (Completed)   Comprehensive metabolic panel (Completed)   Microalbumin / creatinine urine ratio (Completed)   POCT Urinalysis Dipstick (Automated)    Other Visit Diagnoses    Type 2 diabetes mellitus with other ophthalmic complication, without long-term current use of insulin (HCC)    -  Primary   Relevant Medications   nateglinide (STARLIX) 60 MG tablet   Other Relevant Orders   Lipid panel (Completed)   Hemoglobin A1c (Completed)   Comprehensive metabolic panel (Completed)   Microalbumin / creatinine urine ratio (Completed)   POCT Urinalysis Dipstick (Automated)   Hyperlipidemia associated with type 2 diabetes mellitus (HCC)       Relevant Medications   nateglinide (STARLIX) 60 MG tablet   Other Relevant Orders   Lipid panel (Completed)   Comprehensive metabolic panel (Completed)   Suspicious nevus       Relevant Orders   Ambulatory referral to Dermatology      Follow-up: Return in about 6 months (around 05/13/2018), or if symptoms worsen or fail to improve, for hypertension, hyperlipidemia, diabetes II.  Ann Held, DO

## 2017-11-10 NOTE — Patient Instructions (Signed)

## 2017-11-10 NOTE — Assessment & Plan Note (Signed)
hgba1c to be checked minimize simple carbs. Increase exercise as tolerated. Continue current meds 

## 2017-11-10 NOTE — Assessment & Plan Note (Signed)
con't meds  Check labs 

## 2017-11-10 NOTE — Assessment & Plan Note (Signed)
Well controlled, no changes to meds. Encouraged heart healthy diet such as the DASH diet and exercise as tolerated.  °

## 2017-11-17 ENCOUNTER — Telehealth: Payer: Self-pay

## 2017-11-17 DIAGNOSIS — E1169 Type 2 diabetes mellitus with other specified complication: Secondary | ICD-10-CM

## 2017-11-17 DIAGNOSIS — E039 Hypothyroidism, unspecified: Secondary | ICD-10-CM

## 2017-11-17 DIAGNOSIS — E1165 Type 2 diabetes mellitus with hyperglycemia: Principal | ICD-10-CM

## 2017-11-17 DIAGNOSIS — E785 Hyperlipidemia, unspecified: Secondary | ICD-10-CM

## 2017-11-17 DIAGNOSIS — E1151 Type 2 diabetes mellitus with diabetic peripheral angiopathy without gangrene: Secondary | ICD-10-CM

## 2017-11-17 DIAGNOSIS — IMO0002 Reserved for concepts with insufficient information to code with codable children: Secondary | ICD-10-CM

## 2017-11-17 NOTE — Telephone Encounter (Signed)
Lab orders placed per Dr. Etter Sjogren.

## 2017-11-17 NOTE — Telephone Encounter (Signed)
-----   Message from Ann Held, DO sent at 11/16/2017  6:34 PM EDT ----- Cholesterol--- LDL goal < 70,  HDL >40,  TG < 150.  Diet and exercise will increase HDL and decrease LDL and TG.  Fish,  Fish Oil, Flaxseed oil will also help increase the HDL and decrease Triglycerides.   Recheck labs in 3 months   Dm controlled Lipid, cmp, hgba1c---- lab only ok.

## 2017-11-20 ENCOUNTER — Telehealth: Payer: Self-pay

## 2017-11-20 DIAGNOSIS — J3081 Allergic rhinitis due to animal (cat) (dog) hair and dander: Secondary | ICD-10-CM | POA: Diagnosis not present

## 2017-11-20 DIAGNOSIS — J301 Allergic rhinitis due to pollen: Secondary | ICD-10-CM | POA: Diagnosis not present

## 2017-11-20 DIAGNOSIS — J3089 Other allergic rhinitis: Secondary | ICD-10-CM | POA: Diagnosis not present

## 2017-11-20 NOTE — Telephone Encounter (Signed)
Patient is scheduled to see Dermatologist in Sept

## 2017-11-20 NOTE — Telephone Encounter (Signed)
Author phoned pt. to relay Dr. Nonda Lou message. No answer. Author left detailed VM, asking pt. to call back regarding whether or not pt. has made a dermatolgy appointment yet.

## 2017-11-20 NOTE — Telephone Encounter (Signed)
-----   Message from Ann Held, [place] sent at 11/18/2017 12:21 PM EDT ----- I don't believe its her bp med--- more likely although still <1% chance the pravachol We referred her to derm -- -does she have an appointment--- she can discuss hair loss with derm as well.

## 2017-11-24 DIAGNOSIS — J3081 Allergic rhinitis due to animal (cat) (dog) hair and dander: Secondary | ICD-10-CM | POA: Diagnosis not present

## 2017-11-24 DIAGNOSIS — J3089 Other allergic rhinitis: Secondary | ICD-10-CM | POA: Diagnosis not present

## 2017-11-26 ENCOUNTER — Encounter: Payer: Self-pay | Admitting: *Deleted

## 2017-11-26 DIAGNOSIS — J3089 Other allergic rhinitis: Secondary | ICD-10-CM | POA: Diagnosis not present

## 2017-11-26 DIAGNOSIS — J3081 Allergic rhinitis due to animal (cat) (dog) hair and dander: Secondary | ICD-10-CM | POA: Diagnosis not present

## 2017-12-01 DIAGNOSIS — J3081 Allergic rhinitis due to animal (cat) (dog) hair and dander: Secondary | ICD-10-CM | POA: Diagnosis not present

## 2017-12-01 DIAGNOSIS — J301 Allergic rhinitis due to pollen: Secondary | ICD-10-CM | POA: Diagnosis not present

## 2017-12-01 DIAGNOSIS — J3089 Other allergic rhinitis: Secondary | ICD-10-CM | POA: Diagnosis not present

## 2017-12-04 DIAGNOSIS — J3081 Allergic rhinitis due to animal (cat) (dog) hair and dander: Secondary | ICD-10-CM | POA: Diagnosis not present

## 2017-12-04 DIAGNOSIS — J3089 Other allergic rhinitis: Secondary | ICD-10-CM | POA: Diagnosis not present

## 2017-12-04 DIAGNOSIS — J301 Allergic rhinitis due to pollen: Secondary | ICD-10-CM | POA: Diagnosis not present

## 2017-12-06 ENCOUNTER — Other Ambulatory Visit: Payer: Self-pay | Admitting: Family Medicine

## 2017-12-06 DIAGNOSIS — K219 Gastro-esophageal reflux disease without esophagitis: Secondary | ICD-10-CM

## 2018-01-06 DIAGNOSIS — J3081 Allergic rhinitis due to animal (cat) (dog) hair and dander: Secondary | ICD-10-CM | POA: Diagnosis not present

## 2018-01-06 DIAGNOSIS — J301 Allergic rhinitis due to pollen: Secondary | ICD-10-CM | POA: Diagnosis not present

## 2018-01-06 DIAGNOSIS — J3089 Other allergic rhinitis: Secondary | ICD-10-CM | POA: Diagnosis not present

## 2018-01-07 ENCOUNTER — Other Ambulatory Visit: Payer: Self-pay | Admitting: Family Medicine

## 2018-01-07 DIAGNOSIS — J3089 Other allergic rhinitis: Secondary | ICD-10-CM | POA: Diagnosis not present

## 2018-01-07 DIAGNOSIS — E1139 Type 2 diabetes mellitus with other diabetic ophthalmic complication: Secondary | ICD-10-CM

## 2018-01-07 DIAGNOSIS — L570 Actinic keratosis: Secondary | ICD-10-CM | POA: Diagnosis not present

## 2018-01-07 DIAGNOSIS — J301 Allergic rhinitis due to pollen: Secondary | ICD-10-CM | POA: Diagnosis not present

## 2018-01-07 DIAGNOSIS — L821 Other seborrheic keratosis: Secondary | ICD-10-CM | POA: Diagnosis not present

## 2018-01-23 DIAGNOSIS — Z23 Encounter for immunization: Secondary | ICD-10-CM | POA: Diagnosis not present

## 2018-02-02 DIAGNOSIS — J3089 Other allergic rhinitis: Secondary | ICD-10-CM | POA: Diagnosis not present

## 2018-02-02 DIAGNOSIS — J3081 Allergic rhinitis due to animal (cat) (dog) hair and dander: Secondary | ICD-10-CM | POA: Diagnosis not present

## 2018-02-02 DIAGNOSIS — J301 Allergic rhinitis due to pollen: Secondary | ICD-10-CM | POA: Diagnosis not present

## 2018-02-04 ENCOUNTER — Encounter: Payer: Self-pay | Admitting: *Deleted

## 2018-02-05 DIAGNOSIS — J301 Allergic rhinitis due to pollen: Secondary | ICD-10-CM | POA: Diagnosis not present

## 2018-02-05 DIAGNOSIS — J3089 Other allergic rhinitis: Secondary | ICD-10-CM | POA: Diagnosis not present

## 2018-02-10 DIAGNOSIS — J3081 Allergic rhinitis due to animal (cat) (dog) hair and dander: Secondary | ICD-10-CM | POA: Diagnosis not present

## 2018-02-10 DIAGNOSIS — J301 Allergic rhinitis due to pollen: Secondary | ICD-10-CM | POA: Diagnosis not present

## 2018-02-10 DIAGNOSIS — J3089 Other allergic rhinitis: Secondary | ICD-10-CM | POA: Diagnosis not present

## 2018-02-13 DIAGNOSIS — J301 Allergic rhinitis due to pollen: Secondary | ICD-10-CM | POA: Diagnosis not present

## 2018-02-13 DIAGNOSIS — J3089 Other allergic rhinitis: Secondary | ICD-10-CM | POA: Diagnosis not present

## 2018-02-13 DIAGNOSIS — J3081 Allergic rhinitis due to animal (cat) (dog) hair and dander: Secondary | ICD-10-CM | POA: Diagnosis not present

## 2018-02-17 ENCOUNTER — Other Ambulatory Visit (INDEPENDENT_AMBULATORY_CARE_PROVIDER_SITE_OTHER): Payer: Medicare Other

## 2018-02-17 DIAGNOSIS — E785 Hyperlipidemia, unspecified: Secondary | ICD-10-CM | POA: Diagnosis not present

## 2018-02-17 DIAGNOSIS — J3089 Other allergic rhinitis: Secondary | ICD-10-CM | POA: Diagnosis not present

## 2018-02-17 DIAGNOSIS — E1169 Type 2 diabetes mellitus with other specified complication: Secondary | ICD-10-CM

## 2018-02-17 DIAGNOSIS — J301 Allergic rhinitis due to pollen: Secondary | ICD-10-CM | POA: Diagnosis not present

## 2018-02-17 DIAGNOSIS — E1151 Type 2 diabetes mellitus with diabetic peripheral angiopathy without gangrene: Secondary | ICD-10-CM | POA: Diagnosis not present

## 2018-02-17 DIAGNOSIS — E1165 Type 2 diabetes mellitus with hyperglycemia: Secondary | ICD-10-CM

## 2018-02-17 DIAGNOSIS — IMO0002 Reserved for concepts with insufficient information to code with codable children: Secondary | ICD-10-CM

## 2018-02-17 LAB — COMPREHENSIVE METABOLIC PANEL
ALBUMIN: 4.8 g/dL (ref 3.5–5.2)
ALT: 21 U/L (ref 0–35)
AST: 16 U/L (ref 0–37)
Alkaline Phosphatase: 27 U/L — ABNORMAL LOW (ref 39–117)
BILIRUBIN TOTAL: 0.5 mg/dL (ref 0.2–1.2)
BUN: 27 mg/dL — ABNORMAL HIGH (ref 6–23)
CALCIUM: 9.5 mg/dL (ref 8.4–10.5)
CHLORIDE: 101 meq/L (ref 96–112)
CO2: 26 mEq/L (ref 19–32)
Creatinine, Ser: 1.05 mg/dL (ref 0.40–1.20)
GFR: 54.4 mL/min — AB (ref >60.00)
Glucose, Bld: 140 mg/dL — ABNORMAL HIGH (ref 70–99)
Potassium: 4.2 mEq/L (ref 3.5–5.1)
Sodium: 137 mEq/L (ref 135–145)
Total Protein: 7.4 g/dL (ref 6.0–8.3)

## 2018-02-17 LAB — LIPID PANEL
CHOL/HDL RATIO: 3
CHOLESTEROL: 123 mg/dL (ref 0–200)
HDL: 40 mg/dL (ref >39.00)
LDL CALC: 45 mg/dL (ref 0–99)
NonHDL: 82.79
TRIGLYCERIDES: 191 mg/dL — AB (ref 0.0–149.0)
VLDL: 38.2 mg/dL (ref 0.0–40.0)

## 2018-02-17 LAB — HEMOGLOBIN A1C: Hgb A1c MFr Bld: 6.8 % — ABNORMAL HIGH (ref 4.6–6.5)

## 2018-03-09 ENCOUNTER — Other Ambulatory Visit: Payer: Self-pay | Admitting: Family Medicine

## 2018-03-09 DIAGNOSIS — E1151 Type 2 diabetes mellitus with diabetic peripheral angiopathy without gangrene: Secondary | ICD-10-CM

## 2018-03-09 DIAGNOSIS — E1139 Type 2 diabetes mellitus with other diabetic ophthalmic complication: Secondary | ICD-10-CM

## 2018-03-18 DIAGNOSIS — J301 Allergic rhinitis due to pollen: Secondary | ICD-10-CM | POA: Diagnosis not present

## 2018-03-18 DIAGNOSIS — J3089 Other allergic rhinitis: Secondary | ICD-10-CM | POA: Diagnosis not present

## 2018-03-18 DIAGNOSIS — J3081 Allergic rhinitis due to animal (cat) (dog) hair and dander: Secondary | ICD-10-CM | POA: Diagnosis not present

## 2018-03-31 DIAGNOSIS — J3081 Allergic rhinitis due to animal (cat) (dog) hair and dander: Secondary | ICD-10-CM | POA: Diagnosis not present

## 2018-03-31 DIAGNOSIS — J3089 Other allergic rhinitis: Secondary | ICD-10-CM | POA: Diagnosis not present

## 2018-03-31 DIAGNOSIS — J4520 Mild intermittent asthma, uncomplicated: Secondary | ICD-10-CM | POA: Diagnosis not present

## 2018-03-31 DIAGNOSIS — J301 Allergic rhinitis due to pollen: Secondary | ICD-10-CM | POA: Diagnosis not present

## 2018-04-01 DIAGNOSIS — E119 Type 2 diabetes mellitus without complications: Secondary | ICD-10-CM | POA: Diagnosis not present

## 2018-04-01 DIAGNOSIS — H35033 Hypertensive retinopathy, bilateral: Secondary | ICD-10-CM | POA: Diagnosis not present

## 2018-04-01 LAB — HM DIABETES EYE EXAM

## 2018-04-07 ENCOUNTER — Other Ambulatory Visit: Payer: Self-pay | Admitting: *Deleted

## 2018-04-07 MED ORDER — ALENDRONATE SODIUM 70 MG PO TABS: ORAL_TABLET | ORAL | 3 refills | Status: DC

## 2018-04-08 ENCOUNTER — Other Ambulatory Visit: Payer: Self-pay | Admitting: Family Medicine

## 2018-04-08 ENCOUNTER — Telehealth: Payer: Self-pay | Admitting: *Deleted

## 2018-04-08 DIAGNOSIS — E785 Hyperlipidemia, unspecified: Secondary | ICD-10-CM

## 2018-04-08 DIAGNOSIS — J3081 Allergic rhinitis due to animal (cat) (dog) hair and dander: Secondary | ICD-10-CM | POA: Diagnosis not present

## 2018-04-08 DIAGNOSIS — E1151 Type 2 diabetes mellitus with diabetic peripheral angiopathy without gangrene: Secondary | ICD-10-CM

## 2018-04-08 DIAGNOSIS — J3089 Other allergic rhinitis: Secondary | ICD-10-CM | POA: Diagnosis not present

## 2018-04-08 DIAGNOSIS — IMO0002 Reserved for concepts with insufficient information to code with codable children: Secondary | ICD-10-CM

## 2018-04-08 DIAGNOSIS — E1165 Type 2 diabetes mellitus with hyperglycemia: Secondary | ICD-10-CM

## 2018-04-08 DIAGNOSIS — J301 Allergic rhinitis due to pollen: Secondary | ICD-10-CM | POA: Diagnosis not present

## 2018-04-08 NOTE — Telephone Encounter (Signed)
Received Diabetic Eye Exam Report from [facility], [place]; forwarded to provider/SLS 12/18

## 2018-04-17 ENCOUNTER — Encounter: Payer: Self-pay | Admitting: *Deleted

## 2018-04-18 ENCOUNTER — Other Ambulatory Visit: Payer: Self-pay | Admitting: Family Medicine

## 2018-04-20 ENCOUNTER — Other Ambulatory Visit: Payer: Self-pay | Admitting: *Deleted

## 2018-04-20 MED ORDER — ONETOUCH DELICA PLUS LANCET33G MISC: 1.0000 | Freq: Every day | 1 refills | Status: DC

## 2018-04-23 ENCOUNTER — Other Ambulatory Visit: Payer: Self-pay | Admitting: *Deleted

## 2018-04-23 MED ORDER — ONETOUCH DELICA LANCETS 33G MISC: 1 refills | Status: DC

## 2018-04-24 ENCOUNTER — Other Ambulatory Visit: Payer: Self-pay | Admitting: *Deleted

## 2018-04-24 DIAGNOSIS — E1165 Type 2 diabetes mellitus with hyperglycemia: Principal | ICD-10-CM

## 2018-04-24 DIAGNOSIS — E1151 Type 2 diabetes mellitus with diabetic peripheral angiopathy without gangrene: Secondary | ICD-10-CM

## 2018-04-24 DIAGNOSIS — IMO0002 Reserved for concepts with insufficient information to code with codable children: Secondary | ICD-10-CM

## 2018-04-24 MED ORDER — GLUCOSE BLOOD VI STRP: ORAL_STRIP | 1 refills | Status: DC

## 2018-04-24 MED ORDER — ONETOUCH DELICA LANCETS 33G MISC: 1 refills | Status: DC

## 2018-04-27 DIAGNOSIS — Z1231 Encounter for screening mammogram for malignant neoplasm of breast: Secondary | ICD-10-CM | POA: Diagnosis not present

## 2018-04-27 LAB — HM MAMMOGRAPHY: HM Mammogram: ABNORMAL — AB (ref 0–4)

## 2018-04-28 ENCOUNTER — Encounter: Payer: Self-pay | Admitting: Family Medicine

## 2018-04-28 ENCOUNTER — Ambulatory Visit (INDEPENDENT_AMBULATORY_CARE_PROVIDER_SITE_OTHER): Payer: Medicare Other | Admitting: Family Medicine

## 2018-04-28 VITALS — BP 158/76 | HR 81 | Temp 98.1°F | Resp 16 | Ht 62.0 in | Wt 140.8 lb

## 2018-04-28 DIAGNOSIS — T7411XA Adult physical abuse, confirmed, initial encounter: Secondary | ICD-10-CM | POA: Diagnosis not present

## 2018-04-28 DIAGNOSIS — I10 Essential (primary) hypertension: Secondary | ICD-10-CM

## 2018-04-28 DIAGNOSIS — E1169 Type 2 diabetes mellitus with other specified complication: Secondary | ICD-10-CM | POA: Insufficient documentation

## 2018-04-28 DIAGNOSIS — E785 Hyperlipidemia, unspecified: Secondary | ICD-10-CM

## 2018-04-28 DIAGNOSIS — E1151 Type 2 diabetes mellitus with diabetic peripheral angiopathy without gangrene: Secondary | ICD-10-CM | POA: Diagnosis not present

## 2018-04-28 DIAGNOSIS — E1165 Type 2 diabetes mellitus with hyperglycemia: Secondary | ICD-10-CM

## 2018-04-28 DIAGNOSIS — IMO0002 Reserved for concepts with insufficient information to code with codable children: Secondary | ICD-10-CM

## 2018-04-28 LAB — MICROALBUMIN / CREATININE URINE RATIO
Creatinine,U: 54.4 mg/dL
Microalb Creat Ratio: 2.3 mg/g (ref 0.0–30.0)
Microalb, Ur: 1.2 mg/dL (ref 0.0–1.9)

## 2018-04-28 LAB — LIPID PANEL
Cholesterol: 127 mg/dL (ref 0–200)
HDL: 47 mg/dL (ref >39.00)
LDL Cholesterol: 56 mg/dL (ref 0–99)
NonHDL: 79.91
TRIGLYCERIDES: 121 mg/dL (ref 0.0–149.0)
Total CHOL/HDL Ratio: 3
VLDL: 24.2 mg/dL (ref 0.0–40.0)

## 2018-04-28 LAB — HEMOGLOBIN A1C: Hgb A1c MFr Bld: 6.5 % (ref 4.6–6.5)

## 2018-04-28 LAB — COMPREHENSIVE METABOLIC PANEL
ALBUMIN: 4.9 g/dL (ref 3.5–5.2)
ALK PHOS: 24 U/L — AB (ref 39–117)
ALT: 25 U/L (ref 0–35)
AST: 20 U/L (ref 0–37)
BUN: 26 mg/dL — ABNORMAL HIGH (ref 6–23)
CO2: 27 mEq/L (ref 19–32)
Calcium: 9.7 mg/dL (ref 8.4–10.5)
Chloride: 101 mEq/L (ref 96–112)
Creatinine, Ser: 1.1 mg/dL (ref 0.40–1.20)
GFR: 51.53 mL/min — ABNORMAL LOW (ref >60.00)
Glucose, Bld: 140 mg/dL — ABNORMAL HIGH (ref 70–99)
Potassium: 4 mEq/L (ref 3.5–5.1)
Sodium: 137 mEq/L (ref 135–145)
Total Bilirubin: 0.4 mg/dL (ref 0.2–1.2)
Total Protein: 7.4 g/dL (ref 6.0–8.3)

## 2018-04-28 MED ORDER — AMLODIPINE BESYLATE 2.5 MG PO TABS: 2.5000 mg | ORAL_TABLET | Freq: Every day | ORAL | 1 refills | Status: DC

## 2018-04-28 MED ORDER — OLMESARTAN MEDOXOMIL 20 MG PO TABS: 20.0000 mg | ORAL_TABLET | Freq: Every day | ORAL | 3 refills | Status: DC

## 2018-04-28 NOTE — Patient Instructions (Signed)
DASH Eating Plan  DASH stands for "Dietary Approaches to Stop Hypertension." The DASH eating plan is a healthy eating plan that has been shown to reduce high blood pressure (hypertension). It may also reduce your risk for type 2 diabetes, heart disease, and stroke. The DASH eating plan may also help with weight loss.  What are tips for following this plan?    General guidelines   Avoid eating more than 2,300 mg (milligrams) of salt (sodium) a day. If you have hypertension, you may need to reduce your sodium intake to 1,500 mg a day.   Limit alcohol intake to no more than 1 drink a day for nonpregnant women and 2 drinks a day for men. One drink equals 12 oz of beer, 5 oz of wine, or 1 oz of hard liquor.   Work with your health care provider to maintain a healthy body weight or to lose weight. Ask what an ideal weight is for you.   Get at least 30 minutes of exercise that causes your heart to beat faster (aerobic exercise) most days of the week. Activities may include walking, swimming, or biking.   Work with your health care provider or diet and nutrition specialist (dietitian) to adjust your eating plan to your individual calorie needs.  Reading food labels     Check food labels for the amount of sodium per serving. Choose foods with less than 5 percent of the Daily Value of sodium. Generally, foods with less than 300 mg of sodium per serving fit into this eating plan.   To find whole grains, look for the word "whole" as the first word in the ingredient list.  Shopping   Buy products labeled as "low-sodium" or "no salt added."   Buy fresh foods. Avoid canned foods and premade or frozen meals.  Cooking   Avoid adding salt when cooking. Use salt-free seasonings or herbs instead of table salt or sea salt. Check with your health care provider or pharmacist before using salt substitutes.   Do not fry foods. Cook foods using healthy methods such as baking, boiling, grilling, and broiling instead.   Cook with  heart-healthy oils, such as olive, canola, soybean, or sunflower oil.  Meal planning   Eat a balanced diet that includes:  ? 5 or more servings of fruits and vegetables each day. At each meal, try to fill half of your plate with fruits and vegetables.  ? Up to 6-8 servings of whole grains each day.  ? Less than 6 oz of lean meat, poultry, or fish each day. A 3-oz serving of meat is about the same size as a deck of cards. One egg equals 1 oz.  ? 2 servings of low-fat dairy each day.  ? A serving of nuts, seeds, or beans 5 times each week.  ? Heart-healthy fats. Healthy fats called Omega-3 fatty acids are found in foods such as flaxseeds and coldwater fish, like sardines, salmon, and mackerel.   Limit how much you eat of the following:  ? Canned or prepackaged foods.  ? Food that is high in trans fat, such as fried foods.  ? Food that is high in saturated fat, such as fatty meat.  ? Sweets, desserts, sugary drinks, and other foods with added sugar.  ? Full-fat dairy products.   Do not salt foods before eating.   Try to eat at least 2 vegetarian meals each week.   Eat more home-cooked food and less restaurant, buffet, and fast food.     When eating at a restaurant, ask that your food be prepared with less salt or no salt, if possible.  What foods are recommended?  The items listed may not be a complete list. Talk with your dietitian about what dietary choices are best for you.  Grains  Whole-grain or whole-wheat bread. Whole-grain or whole-wheat pasta. Brown rice. Oatmeal. Quinoa. Bulgur. Whole-grain and low-sodium cereals. Pita bread. Low-fat, low-sodium crackers. Whole-wheat flour tortillas.  Vegetables  Fresh or frozen vegetables (raw, steamed, roasted, or grilled). Low-sodium or reduced-sodium tomato and vegetable juice. Low-sodium or reduced-sodium tomato sauce and tomato paste. Low-sodium or reduced-sodium canned vegetables.  Fruits  All fresh, dried, or frozen fruit. Canned fruit in natural juice (without  added sugar).  Meat and other protein foods  Skinless chicken or turkey. Ground chicken or turkey. Pork with fat trimmed off. Fish and seafood. Egg whites. Dried beans, peas, or lentils. Unsalted nuts, nut butters, and seeds. Unsalted canned beans. Lean cuts of beef with fat trimmed off. Low-sodium, lean deli meat.  Dairy  Low-fat (1%) or fat-free (skim) milk. Fat-free, low-fat, or reduced-fat cheeses. Nonfat, low-sodium ricotta or cottage cheese. Low-fat or nonfat yogurt. Low-fat, low-sodium cheese.  Fats and oils  Soft margarine without trans fats. Vegetable oil. Low-fat, reduced-fat, or light mayonnaise and salad dressings (reduced-sodium). Canola, safflower, olive, soybean, and sunflower oils. Avocado.  Seasoning and other foods  Herbs. Spices. Seasoning mixes without salt. Unsalted popcorn and pretzels. Fat-free sweets.  What foods are not recommended?  The items listed may not be a complete list. Talk with your dietitian about what dietary choices are best for you.  Grains  Baked goods made with fat, such as croissants, muffins, or some breads. Dry pasta or rice meal packs.  Vegetables  Creamed or fried vegetables. Vegetables in a cheese sauce. Regular canned vegetables (not low-sodium or reduced-sodium). Regular canned tomato sauce and paste (not low-sodium or reduced-sodium). Regular tomato and vegetable juice (not low-sodium or reduced-sodium). Pickles. Olives.  Fruits  Canned fruit in a light or heavy syrup. Fried fruit. Fruit in cream or butter sauce.  Meat and other protein foods  Fatty cuts of meat. Ribs. Fried meat. Bacon. Sausage. Bologna and other processed lunch meats. Salami. Fatback. Hotdogs. Bratwurst. Salted nuts and seeds. Canned beans with added salt. Canned or smoked fish. Whole eggs or egg yolks. Chicken or turkey with skin.  Dairy  Whole or 2% milk, cream, and half-and-half. Whole or full-fat cream cheese. Whole-fat or sweetened yogurt. Full-fat cheese. Nondairy creamers. Whipped toppings.  Processed cheese and cheese spreads.  Fats and oils  Butter. Stick margarine. Lard. Shortening. Ghee. Bacon fat. Tropical oils, such as coconut, palm kernel, or palm oil.  Seasoning and other foods  Salted popcorn and pretzels. Onion salt, garlic salt, seasoned salt, table salt, and sea salt. Worcestershire sauce. Tartar sauce. Barbecue sauce. Teriyaki sauce. Soy sauce, including reduced-sodium. Steak sauce. Canned and packaged gravies. Fish sauce. Oyster sauce. Cocktail sauce. Horseradish that you find on the shelf. Ketchup. Mustard. Meat flavorings and tenderizers. Bouillon cubes. Hot sauce and Tabasco sauce. Premade or packaged marinades. Premade or packaged taco seasonings. Relishes. Regular salad dressings.  Where to find more information:   National Heart, Lung, and Blood Institute: www.nhlbi.nih.gov   American Heart Association: www.heart.org  Summary   The DASH eating plan is a healthy eating plan that has been shown to reduce high blood pressure (hypertension). It may also reduce your risk for type 2 diabetes, heart disease, and stroke.   With the   DASH eating plan, you should limit salt (sodium) intake to 2,300 mg a day. If you have hypertension, you may need to reduce your sodium intake to 1,500 mg a day.   When on the DASH eating plan, aim to eat more fresh fruits and vegetables, whole grains, lean proteins, low-fat dairy, and heart-healthy fats.   Work with your health care provider or diet and nutrition specialist (dietitian) to adjust your eating plan to your individual calorie needs.  This information is not intended to replace advice given to you by your health care provider. Make sure you discuss any questions you have with your health care provider.  Document Released: 03/28/2011 Document Revised: 04/01/2016 Document Reviewed: 04/01/2016  Elsevier Interactive Patient Education  2019 [employer]

## 2018-04-28 NOTE — Assessment & Plan Note (Signed)
Pt referred for counseling
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
## 2018-04-28 NOTE — Assessment & Plan Note (Signed)
hgba1c to be checked, minimize simple carbs. Increase exercise as tolerated. Continue current meds  

## 2018-04-28 NOTE — Assessment & Plan Note (Signed)
Tolerating statin, encouraged heart healthy diet, avoid trans fats, minimize simple carbs and saturated fats. Increase exercise as tolerated 

## 2018-04-28 NOTE — Assessment & Plan Note (Signed)
Well controlled, no changes to meds. Encouraged heart healthy diet such as the DASH diet and exercise as tolerated.  °

## 2018-04-29 ENCOUNTER — Telehealth: Payer: Self-pay | Admitting: *Deleted

## 2018-04-29 NOTE — Telephone Encounter (Signed)
They normally contact the pt and set up the app for the additional views ---  Is that not stated on the mammogram?

## 2018-04-29 NOTE — Telephone Encounter (Signed)
On results, states that patient will be contacted to schedule appt.

## 2018-04-29 NOTE — Telephone Encounter (Signed)
Received Mammogram results from [place], Impression: Incomplete-Additional Imaging Evaluation Needed [possible 1.3 cm oval mass in left breast is indeterminate.] An Ultrasound is Recommended; forwarded to provider/SLS 01/08

## 2018-04-30 ENCOUNTER — Encounter: Payer: Self-pay | Admitting: Family Medicine

## 2018-04-30 DIAGNOSIS — N6002 Solitary cyst of left breast: Secondary | ICD-10-CM | POA: Diagnosis not present

## 2018-04-30 DIAGNOSIS — R922 Inconclusive mammogram: Secondary | ICD-10-CM | POA: Diagnosis not present

## 2018-05-01 NOTE — Telephone Encounter (Signed)
Received Mammogram results from [place]; forwarded to provider/SLS 01/10

## 2018-05-05 ENCOUNTER — Telehealth: Payer: Self-pay | Admitting: *Deleted

## 2018-05-05 NOTE — Telephone Encounter (Signed)
Forwarded requested documentation to [facility] via fax to [phone] with confirmation/SLS 01/14

## 2018-05-08 ENCOUNTER — Other Ambulatory Visit: Payer: Self-pay | Admitting: Family Medicine

## 2018-05-08 DIAGNOSIS — E1139 Type 2 diabetes mellitus with other diabetic ophthalmic complication: Secondary | ICD-10-CM

## 2018-05-08 DIAGNOSIS — E039 Hypothyroidism, unspecified: Secondary | ICD-10-CM

## 2018-05-11 DIAGNOSIS — J3081 Allergic rhinitis due to animal (cat) (dog) hair and dander: Secondary | ICD-10-CM | POA: Diagnosis not present

## 2018-05-11 DIAGNOSIS — J301 Allergic rhinitis due to pollen: Secondary | ICD-10-CM | POA: Diagnosis not present

## 2018-05-11 DIAGNOSIS — J3089 Other allergic rhinitis: Secondary | ICD-10-CM | POA: Diagnosis not present

## 2018-05-19 ENCOUNTER — Encounter: Payer: Self-pay | Admitting: Family Medicine

## 2018-06-06 ENCOUNTER — Other Ambulatory Visit: Payer: Self-pay | Admitting: Family Medicine

## 2018-06-06 DIAGNOSIS — K219 Gastro-esophageal reflux disease without esophagitis: Secondary | ICD-10-CM

## 2018-06-10 DIAGNOSIS — J3081 Allergic rhinitis due to animal (cat) (dog) hair and dander: Secondary | ICD-10-CM | POA: Diagnosis not present

## 2018-06-10 DIAGNOSIS — J3089 Other allergic rhinitis: Secondary | ICD-10-CM | POA: Diagnosis not present

## 2018-06-10 DIAGNOSIS — J301 Allergic rhinitis due to pollen: Secondary | ICD-10-CM | POA: Diagnosis not present

## 2018-06-23 ENCOUNTER — Other Ambulatory Visit: Payer: Self-pay | Admitting: Family Medicine

## 2018-06-23 DIAGNOSIS — I10 Essential (primary) hypertension: Secondary | ICD-10-CM

## 2018-06-24 ENCOUNTER — Other Ambulatory Visit: Payer: Self-pay | Admitting: *Deleted

## 2018-06-24 DIAGNOSIS — I10 Essential (primary) hypertension: Secondary | ICD-10-CM

## 2018-06-24 MED ORDER — AMLODIPINE BESYLATE 2.5 MG PO TABS: 2.5000 mg | ORAL_TABLET | Freq: Every day | ORAL | 1 refills | Status: DC

## 2018-07-04 ENCOUNTER — Other Ambulatory Visit: Payer: Self-pay | Admitting: Family Medicine

## 2018-07-04 DIAGNOSIS — E785 Hyperlipidemia, unspecified: Secondary | ICD-10-CM

## 2018-07-04 DIAGNOSIS — E1139 Type 2 diabetes mellitus with other diabetic ophthalmic complication: Secondary | ICD-10-CM

## 2018-07-13 DIAGNOSIS — J3081 Allergic rhinitis due to animal (cat) (dog) hair and dander: Secondary | ICD-10-CM | POA: Diagnosis not present

## 2018-07-13 DIAGNOSIS — J301 Allergic rhinitis due to pollen: Secondary | ICD-10-CM | POA: Diagnosis not present

## 2018-07-13 DIAGNOSIS — D2371 Other benign neoplasm of skin of right lower limb, including hip: Secondary | ICD-10-CM | POA: Diagnosis not present

## 2018-07-13 DIAGNOSIS — J3089 Other allergic rhinitis: Secondary | ICD-10-CM | POA: Diagnosis not present

## 2018-07-13 DIAGNOSIS — L565 Disseminated superficial actinic porokeratosis (DSAP): Secondary | ICD-10-CM | POA: Diagnosis not present

## 2018-07-13 DIAGNOSIS — L570 Actinic keratosis: Secondary | ICD-10-CM | POA: Diagnosis not present

## 2018-07-13 DIAGNOSIS — L821 Other seborrheic keratosis: Secondary | ICD-10-CM | POA: Diagnosis not present

## 2018-07-13 DIAGNOSIS — D225 Melanocytic nevi of trunk: Secondary | ICD-10-CM | POA: Diagnosis not present

## 2018-07-13 DIAGNOSIS — L814 Other melanin hyperpigmentation: Secondary | ICD-10-CM | POA: Diagnosis not present

## 2018-07-29 ENCOUNTER — Other Ambulatory Visit: Payer: Self-pay | Admitting: *Deleted

## 2018-07-29 DIAGNOSIS — E1139 Type 2 diabetes mellitus with other diabetic ophthalmic complication: Secondary | ICD-10-CM

## 2018-07-29 MED ORDER — NATEGLINIDE 60 MG PO TABS: 60.0000 mg | ORAL_TABLET | Freq: Three times a day (TID) | ORAL | 1 refills | Status: DC

## 2018-08-17 NOTE — Progress Notes (Signed)
Virtual Visit via Video Note  I connected with patient on 08/18/18 at  8:00 AM EDT by a video enabled telemedicine application and verified that I am speaking with the correct person using two identifiers.   THIS ENCOUNTER IS A VIRTUAL VISIT DUE TO COVID-19 - PATIENT WAS NOT SEEN IN THE OFFICE. PATIENT HAS CONSENTED TO VIRTUAL VISIT / TELEMEDICINE VISIT   Location of patient: home  Location of provider: office  I discussed the limitations of evaluation and management by telemedicine and the availability of in person appointments. The patient expressed understanding and agreed to proceed.   Subjective:   Cheryl Cisneros is a 73 y.o. female who presents for Medicare Annual (Subsequent) preventive examination.  Enjoys reading and bible studies and gardening.  Review of Systems: No ROS.  Medicare Wellness Visit. Additional risk factors are reflected in the social history. Cardiac Risk Factors include: advanced age (>53men, >98 women);dyslipidemia;diabetes mellitus;hypertension Sleep patterns: sleeps well 8 hrs. Home Safety/Smoke Alarms: Feels safe in home. Smoke alarms in place.  Lives with husband in 2 story home. No trouble with stairs. Holds to handrail. Step over tub.  Female:        Mammo- 04/27/18      Dexa scan-04/16/17        CCS- 08/09/15. Due 2022.    Objective:     Vitals: Unable to assess. This visit is enabled though telemedicine due to Covid 19. Pt reports taking at home this morning: BP=124/69. HR=84. CBG=145.  Advanced Directives 08/18/2018 08/15/2017 07/26/2015 06/05/2015  Does Patient Have a Medical Advance Directive? Yes Yes Yes Yes  Type of Paramedic of [place];Living will [place];Living will Living will;Healthcare Power of [place];Living will  Does patient want to make changes to medical advance directive? No - Patient declined No - Patient declined - No - Patient declined  Copy of  [place] in Chart? No - copy requested No - copy requested - No - copy requested    Tobacco Social History   Tobacco Use  Smoking Status Never Smoker  Smokeless Tobacco Never Used     Counseling given: Not Answered   Clinical Intake: Pain : No/denies pain    Past Medical History:  Diagnosis Date   Allergy    Arthritis    Asthma    Borderline glaucoma    Cataract    Diabetes ([place])    GERD (gastroesophageal reflux disease)    Hypertension    Macular degeneration 12/25/2011   Osteopenia    Thyroid disease    Hypothyroidism   Past Surgical History:  Procedure Laterality Date   ABDOMINAL HYSTERECTOMY  1986   TAH BSO   BACK SURGERY  2016   EYE SURGERY Right 07/28/2017   Dr.Beavis. Cataract removal   KNEE ARTHROSCOPY Right 2002   LUMBAR LAMINECTOMY  1999   ROTATOR CUFF REPAIR Right 02/27/2005   TONSILLECTOMY  1963   Family History  Problem Relation Age of Onset   Diabetes Mother    Hypertension Mother    Kidney disease Mother        dialysis   Diabetes Father    Hypertension Father    Cancer Father        lung   Dementia Sister    Diabetes Sister    Hyperlipidemia Sister    Hypertension Sister    Diabetes Brother    Hyperlipidemia Brother    Hypertension Brother    Diabetes Sister  Hypertension Sister    Hyperlipidemia Sister    Other Sister        blood disorder   Cervical cancer Daughter    Cancer Daughter 107       cervical   Coronary artery disease Other    Lung cancer Other    Colon cancer Neg Hx    Social History   Socioeconomic History   Marital status: Married    Spouse name: Not on file   Number of children: Not on file   Years of education: Not on file   Highest education level: Not on file  Occupational History   Occupation: housewife    Employer: UNEMPLOYED  Social Designer, fashion/clothing strain: Not on file   Food insecurity:    Worry: Not on file     Inability: Not on file   Transportation needs:    Medical: Not on file    Non-medical: Not on file  Tobacco Use   Smoking status: Never Smoker   Smokeless tobacco: Never Used  Substance and Sexual Activity   Alcohol use: No    Alcohol/week: 0.0 standard drinks   Drug use: No   Sexual activity: Not Currently    Partners: Male  Lifestyle   Physical activity:    Days per week: Not on file    Minutes per session: Not on file   Stress: Not on file  Relationships   Social connections:    Talks on phone: Not on file    Gets together: Not on file    Attends religious service: Not on file    Active member of club or organization: Not on file    Attends meetings of clubs or organizations: Not on file    Relationship status: Not on file  Other Topics Concern   Not on file  Social History Narrative   Exercise--  bootcamp 3 days a week and walking    Outpatient Encounter Medications as of 08/18/2018  Medication Sig   alendronate (FOSAMAX) 70 MG tablet TAKE ONE TABLET BY MOUTH ONCE A WEEK IN THE MORNING WITH A FULL GLASS OF WATER, 30 MINUTES BEFORE A MEAL OR BEVERAGE. REMAIN UPRIGHT   amLODipine (NORVASC) 2.5 MG tablet Take 1 tablet (2.5 mg total) by mouth daily.   aspirin EC 81 MG tablet Take 1 tablet (81 mg total) by mouth daily.   Biotin 1000 MCG tablet Take 5,000 mcg by mouth 2 (two) times daily.    Calcium-Vitamin D 600-200 MG-UNIT per tablet Take 1 tablet by mouth 3 (three) times daily with meals.     cetirizine (EQ ALLERGY RELIEF, CETIRIZINE,) 10 MG tablet Take 1 tablet (10 mg total) by mouth daily.   Cholecalciferol (VITAMIN D3) 1000 UNITS CAPS Take 1 capsule by mouth daily.     Garlic Oil (SUPER GARLIC) 8119 MG CAPS Take 1 capsule by mouth daily.    glucose blood (ONE TOUCH ULTRA TEST) test strip USE  STRIP TO CHECK GLUCOSE TWICE DAILY.  Dx Code: E11.9   L-Lysine 500 MG TABS Take 1 tablet by mouth daily.     levothyroxine (SYNTHROID, LEVOTHROID) 100 MCG  tablet TAKE 1 TABLET BY MOUTH ONCE DAILY   Magnesium 250 MG TABS Take 1 tablet by mouth daily.    metFORMIN (GLUCOPHAGE) 1000 MG tablet TAKE 1 TABLET BY MOUTH TWICE DAILY WITH MEALS   nateglinide (STARLIX) 60 MG tablet Take 1 tablet (60 mg total) by mouth 3 (three) times daily with meals.   olmesartan (  BENICAR) 20 MG tablet Take 1 tablet (20 mg total) by mouth daily.   Omega-3 Fatty Acids (OMEGA 3 PO) Take 2 tablets by mouth daily.    omeprazole (PRILOSEC) 40 MG capsule TAKE 1 CAPSULE BY MOUTH ONCE DAILY   ONETOUCH DELICA LANCETS 82U MISC Use as directed twice a day.  Dx code: E11.9   OVER THE COUNTER MEDICATION Maxi-vision 2 capsules per day   pravastatin (PRAVACHOL) 10 MG tablet Take 1 tablet (10 mg total) by mouth daily.   SIMPLY SALINE NA Place 1 spray into the nose daily.     Turmeric 500 MG CAPS Take 1 capsule by mouth 3 (three) times daily.    vitamin C (ASCORBIC ACID) 500 MG tablet Take 1,000 mg by mouth daily.   EPIPEN 2-PAK 0.3 MG/0.3ML DEVI Reported on 07/26/2015   olmesartan (BENICAR) 40 MG tablet TAKE 1 TABLET BY MOUTH ONCE DAILY (Patient not taking: Reported on 08/18/2018)   No facility-administered encounter medications on file as of 08/18/2018.     Activities of Daily Living In your present state of health, do you have any difficulty performing the following activities: 08/18/2018  Hearing? N  Vision? N  Comment wears readers.  Difficulty concentrating or making decisions? N  Walking or climbing stairs? N  Dressing or bathing? N  Doing errands, shopping? N  Preparing Food and eating ? N  Using the Toilet? N  In the past six months, have you accidently leaked urine? N  Do you have problems with loss of bowel control? N  Managing your Medications? N  Managing your Finances? N  Housekeeping or managing your Housekeeping? N  Some recent data might be hidden    Patient Care Team: Carollee Herter, Alferd Apa, DO as PCP - General Harold Hedge, Darrick Grinder, MD as  Consulting Physician (Allergy and Immunology) Odette Fraction as Consulting Physician (Optometry)    Assessment:   This is a routine wellness examination for [place]. Physical assessment deferred to PCP.  Exercise Activities and Dietary recommendations Current Exercise Habits: Home exercise routine, Type of exercise: treadmill;strength training/weights, Time (Minutes): 20, Frequency (Times/Week): 6, Weekly Exercise (Minutes/Week): 120, Intensity: Mild, Exercise limited by: None identified   Diet (meal preparation, eat out, water intake, caffeinated beverages, dairy products, fruits and vegetables): in general, a "healthy" diet  , well balanced, on average, 3 meals per day Breakfast: cheerios with bananas. Lunch: chk salad and crackers, beets and olives Dinner: potato soup     Drinks water all day. Goals     Maintain healthy lifestyle.       Fall Risk Fall Risk  08/18/2018 08/15/2017 03/27/2017 11/28/2015 06/05/2015  Falls in the past year? 0 No Yes No No  Comment - - Emmi Telephone Survey: data to providers prior to load - -  Number falls in past yr: - - 1 - -  Comment - - Emmi Telephone Survey Actual Response = 1 - -  Injury with Fall? - - Yes - -    Depression Screen PHQ 2/9 Scores 08/18/2018 08/15/2017 12/03/2016 11/28/2015  PHQ - 2 Score 0 0 0 0     Cognitive Function Ad8 score reviewed for issues:  Issues making decisions:no  Less interest in hobbies / activities:no  Repeats questions, stories (family complaining):no  Trouble using ordinary gadgets (microwave, computer, phone):no  Forgets the month or year: no  Mismanaging finances: no  Remembering appts:no  Daily problems with thinking and/or memory:no Ad8 score is=0  Immunization History  Administered Date(s) Administered   Influenza Split 02/12/2011   Influenza Whole 02/01/2009   Influenza, High Dose Seasonal PF 02/06/2015, 01/04/2016   Influenza,inj,Quad PF,6+ Mos 12/28/2013    Influenza-Unspecified 01/23/2018   Pneumococcal Conjugate-13 06/24/2013   Pneumococcal Polysaccharide-23 12/29/2007, 12/13/2008   Td 09/16/2001   Tdap 06/28/2013   Zoster 11/12/2007   Zoster Recombinat (Shingrix) 11/12/2017, 01/23/2018    Screening Tests Health Maintenance  Topic Date Due   HEMOGLOBIN A1C  10/27/2018   INFLUENZA VACCINE  11/21/2018   OPHTHALMOLOGY EXAM  04/02/2019   DEXA SCAN  04/17/2019   MAMMOGRAM  04/28/2019   FOOT EXAM  04/29/2019   COLONOSCOPY  08/08/2020   TETANUS/TDAP  06/29/2023   Hepatitis C Screening  Completed   PNA vac Low Risk Adult  Completed      Plan:   See you next year.  Continue to eat heart healthy diet (full of fruits, vegetables, whole grains, lean protein, water--limit salt, fat, and sugar intake) and increase physical activity as tolerated.  Bring a copy of your living will and/or healthcare power of attorney to your next office visit.  Continue doing brain stimulating activities (puzzles, reading, adult coloring books, staying active) to keep memory sharp.    I have personally reviewed and noted the following in the patients chart:    Medical and social history  Use of alcohol, tobacco or illicit drugs   Current medications and supplements  Functional ability and status  Nutritional status  Physical activity  Advanced directives  List of other physicians  Hospitalizations, surgeries, and ER visits in previous 12 months  Vitals  Screenings to include cognitive, depression, and falls  Referrals and appointments  In addition, I have reviewed and discussed with patient certain preventive protocols, quality metrics, and best practice recommendations. A written personalized care plan for preventive services as well as general preventive health recommendations were provided to patient.     Naaman Plummer [place], [place]  08/18/2018

## 2018-08-18 ENCOUNTER — Other Ambulatory Visit: Payer: Self-pay

## 2018-08-18 ENCOUNTER — Encounter: Payer: Self-pay | Admitting: *Deleted

## 2018-08-18 ENCOUNTER — Ambulatory Visit (INDEPENDENT_AMBULATORY_CARE_PROVIDER_SITE_OTHER): Payer: Medicare Other | Admitting: *Deleted

## 2018-08-18 DIAGNOSIS — J3081 Allergic rhinitis due to animal (cat) (dog) hair and dander: Secondary | ICD-10-CM | POA: Diagnosis not present

## 2018-08-18 DIAGNOSIS — Z0000 Encounter for general adult medical examination without abnormal findings: Secondary | ICD-10-CM

## 2018-08-18 DIAGNOSIS — J301 Allergic rhinitis due to pollen: Secondary | ICD-10-CM | POA: Diagnosis not present

## 2018-08-18 DIAGNOSIS — J3089 Other allergic rhinitis: Secondary | ICD-10-CM | POA: Diagnosis not present

## 2018-08-18 NOTE — Progress Notes (Signed)
Reviewed  Yvonne R Lowne Chase, DO  

## 2018-08-18 NOTE — Patient Instructions (Signed)
See you next year.  Continue to eat heart healthy diet (full of fruits, vegetables, whole grains, lean protein, water--limit salt, fat, and sugar intake) and increase physical activity as tolerated.  Bring a copy of your living will and/or healthcare power of attorney to your next office visit.  Continue doing brain stimulating activities (puzzles, reading, adult coloring books, staying active) to keep memory sharp.    Cheryl Cisneros , Thank you for taking time to come for your Medicare Wellness Visit. I appreciate your ongoing commitment to your health goals. Please review the following plan we discussed and let me know if I can assist you in the future.   These are the goals we discussed: Goals    . Maintain healthy lifestyle.       This is a list of the screening recommended for you and due dates:  Health Maintenance  Topic Date Due  . Hemoglobin A1C  10/27/2018  . Flu Shot  11/21/2018  . Eye exam for diabetics  04/02/2019  . DEXA scan (bone density measurement)  04/17/2019  . Mammogram  04/28/2019  . Complete foot exam   04/29/2019  . Colon Cancer Screening  08/08/2020  . Tetanus Vaccine  06/29/2023  .  Hepatitis C: One time screening is recommended by Center for Disease Control  (CDC) for  adults born from [birth year] through 1967.   Completed  . Pneumonia vaccines  Completed    Health Maintenance After Age 61 After age 66, you are at a higher risk for certain long-term diseases and infections as well as injuries from falls. Falls are a major cause of broken bones and head injuries in people who are older than age 7. Getting regular preventive care can help to keep you healthy and well. Preventive care includes getting regular testing and making lifestyle changes as recommended by your health care provider. Talk with your health care provider about:  Which screenings and tests you should have. A screening is a test that checks for a disease when you have no symptoms.  A diet and  exercise plan that is right for you. What should I know about screenings and tests to prevent falls? Screening and testing are the best ways to find a health problem early. Early diagnosis and treatment give you the best chance of managing medical conditions that are common after age 70. Certain conditions and lifestyle choices may make you more likely to have a fall. Your health care provider may recommend:  Regular vision checks. Poor vision and conditions such as cataracts can make you more likely to have a fall. If you wear glasses, make sure to get your prescription updated if your vision changes.  Medicine review. Work with your health care provider to regularly review all of the medicines you are taking, including over-the-counter medicines. Ask your health care provider about any side effects that may make you more likely to have a fall. Tell your health care provider if any medicines that you take make you feel dizzy or sleepy.  Osteoporosis screening. Osteoporosis is a condition that causes the bones to get weaker. This can make the bones weak and cause them to break more easily.  Blood pressure screening. Blood pressure changes and medicines to control blood pressure can make you feel dizzy.  Strength and balance checks. Your health care provider may recommend certain tests to check your strength and balance while standing, walking, or changing positions.  Foot health exam. Foot pain and numbness, as well as not  wearing proper footwear, can make you more likely to have a fall.  Depression screening. You may be more likely to have a fall if you have a fear of falling, feel emotionally low, or feel unable to do activities that you used to do.  Alcohol use screening. Using too much alcohol can affect your balance and may make you more likely to have a fall. What actions can I take to lower my risk of falls? General instructions  Talk with your health care provider about your risks for  falling. Tell your health care provider if: ? You fall. Be sure to tell your health care provider about all falls, even ones that seem minor. ? You feel dizzy, sleepy, or off-balance.  Take over-the-counter and prescription medicines only as told by your health care provider. These include any supplements.  Eat a healthy diet and maintain a healthy weight. A healthy diet includes low-fat dairy products, low-fat (lean) meats, and fiber from whole grains, beans, and lots of fruits and vegetables. Home safety  Remove any tripping hazards, such as rugs, cords, and clutter.  Install safety equipment such as grab bars in bathrooms and safety rails on stairs.  Keep rooms and walkways well-lit. Activity   Follow a regular exercise program to stay fit. This will help you maintain your balance. Ask your health care provider what types of exercise are appropriate for you.  If you need a cane or walker, use it as recommended by your health care provider.  Wear supportive shoes that have nonskid soles. Lifestyle  Do not drink alcohol if your health care provider tells you not to drink.  If you drink alcohol, limit how much you have: ? 0-1 drink a day for women. ? 0-2 drinks a day for men.  Be aware of how much alcohol is in your drink. In the [place] one drink equals one typical bottle of beer (12 oz), one-half glass of wine (5 oz), or one shot of hard liquor (1 oz).  Do not use any products that contain nicotine or tobacco, such as cigarettes and e-cigarettes. If you need help quitting, ask your health care provider. Summary  Having a healthy lifestyle and getting preventive care can help to protect your health and wellness after age 70.  Screening and testing are the best way to find a health problem early and help you avoid having a fall. Early diagnosis and treatment give you the best chance for managing medical conditions that are more common for people who are older than age 51.  Falls  are a major cause of broken bones and head injuries in people who are older than age 16. Take precautions to prevent a fall at home.  Work with your health care provider to learn what changes you can make to improve your health and wellness and to prevent falls. This information is not intended to replace advice given to you by your health care provider. Make sure you discuss any questions you have with your health care provider. Document Released: 02/19/2017 Document Revised: 02/19/2017 Document Reviewed: 02/19/2017 Elsevier Interactive Patient Education  2019 [employer].

## 2018-08-24 ENCOUNTER — Telehealth: Payer: Self-pay | Admitting: *Deleted

## 2018-08-24 NOTE — Telephone Encounter (Signed)
Received CBG results from patient via mail; forwarded to provider/SLS 05/04

## 2018-09-05 ENCOUNTER — Other Ambulatory Visit: Payer: Self-pay | Admitting: Family Medicine

## 2018-09-05 DIAGNOSIS — E1151 Type 2 diabetes mellitus with diabetic peripheral angiopathy without gangrene: Secondary | ICD-10-CM

## 2018-09-15 DIAGNOSIS — J3089 Other allergic rhinitis: Secondary | ICD-10-CM | POA: Diagnosis not present

## 2018-09-15 DIAGNOSIS — J301 Allergic rhinitis due to pollen: Secondary | ICD-10-CM | POA: Diagnosis not present

## 2018-09-15 DIAGNOSIS — J3081 Allergic rhinitis due to animal (cat) (dog) hair and dander: Secondary | ICD-10-CM | POA: Diagnosis not present

## 2018-10-23 ENCOUNTER — Other Ambulatory Visit: Payer: Self-pay | Admitting: Family Medicine

## 2018-10-26 DIAGNOSIS — J301 Allergic rhinitis due to pollen: Secondary | ICD-10-CM | POA: Diagnosis not present

## 2018-10-26 DIAGNOSIS — J3089 Other allergic rhinitis: Secondary | ICD-10-CM | POA: Diagnosis not present

## 2018-10-28 DIAGNOSIS — J3081 Allergic rhinitis due to animal (cat) (dog) hair and dander: Secondary | ICD-10-CM | POA: Diagnosis not present

## 2018-10-28 DIAGNOSIS — J3089 Other allergic rhinitis: Secondary | ICD-10-CM | POA: Diagnosis not present

## 2018-10-29 DIAGNOSIS — J3081 Allergic rhinitis due to animal (cat) (dog) hair and dander: Secondary | ICD-10-CM | POA: Diagnosis not present

## 2018-10-29 DIAGNOSIS — J3089 Other allergic rhinitis: Secondary | ICD-10-CM | POA: Diagnosis not present

## 2018-10-29 DIAGNOSIS — J301 Allergic rhinitis due to pollen: Secondary | ICD-10-CM | POA: Diagnosis not present

## 2018-11-02 DIAGNOSIS — J3081 Allergic rhinitis due to animal (cat) (dog) hair and dander: Secondary | ICD-10-CM | POA: Diagnosis not present

## 2018-11-02 DIAGNOSIS — J301 Allergic rhinitis due to pollen: Secondary | ICD-10-CM | POA: Diagnosis not present

## 2018-11-02 DIAGNOSIS — J3089 Other allergic rhinitis: Secondary | ICD-10-CM | POA: Diagnosis not present

## 2018-11-04 DIAGNOSIS — J3089 Other allergic rhinitis: Secondary | ICD-10-CM | POA: Diagnosis not present

## 2018-11-04 DIAGNOSIS — J3081 Allergic rhinitis due to animal (cat) (dog) hair and dander: Secondary | ICD-10-CM | POA: Diagnosis not present

## 2018-11-06 ENCOUNTER — Other Ambulatory Visit: Payer: Self-pay | Admitting: Family Medicine

## 2018-11-06 DIAGNOSIS — E039 Hypothyroidism, unspecified: Secondary | ICD-10-CM

## 2018-11-09 DIAGNOSIS — J3089 Other allergic rhinitis: Secondary | ICD-10-CM | POA: Diagnosis not present

## 2018-11-09 DIAGNOSIS — J301 Allergic rhinitis due to pollen: Secondary | ICD-10-CM | POA: Diagnosis not present

## 2018-11-09 DIAGNOSIS — J3081 Allergic rhinitis due to animal (cat) (dog) hair and dander: Secondary | ICD-10-CM | POA: Diagnosis not present

## 2018-11-11 DIAGNOSIS — J3089 Other allergic rhinitis: Secondary | ICD-10-CM | POA: Diagnosis not present

## 2018-11-11 DIAGNOSIS — J3081 Allergic rhinitis due to animal (cat) (dog) hair and dander: Secondary | ICD-10-CM | POA: Diagnosis not present

## 2018-11-11 DIAGNOSIS — J301 Allergic rhinitis due to pollen: Secondary | ICD-10-CM | POA: Diagnosis not present

## 2018-11-17 ENCOUNTER — Other Ambulatory Visit: Payer: Self-pay | Admitting: *Deleted

## 2018-11-17 MED ORDER — ONETOUCH DELICA LANCETS 33G MISC: 1 refills | Status: DC

## 2018-12-04 ENCOUNTER — Other Ambulatory Visit: Payer: Self-pay | Admitting: Family Medicine

## 2018-12-04 DIAGNOSIS — K219 Gastro-esophageal reflux disease without esophagitis: Secondary | ICD-10-CM

## 2018-12-09 DIAGNOSIS — Z23 Encounter for immunization: Secondary | ICD-10-CM | POA: Diagnosis not present

## 2018-12-15 DIAGNOSIS — J3089 Other allergic rhinitis: Secondary | ICD-10-CM | POA: Diagnosis not present

## 2018-12-15 DIAGNOSIS — J3081 Allergic rhinitis due to animal (cat) (dog) hair and dander: Secondary | ICD-10-CM | POA: Diagnosis not present

## 2018-12-15 DIAGNOSIS — J301 Allergic rhinitis due to pollen: Secondary | ICD-10-CM | POA: Diagnosis not present

## 2019-01-02 ENCOUNTER — Other Ambulatory Visit: Payer: Self-pay | Admitting: Family Medicine

## 2019-01-02 DIAGNOSIS — E785 Hyperlipidemia, unspecified: Secondary | ICD-10-CM

## 2019-01-02 DIAGNOSIS — IMO0002 Reserved for concepts with insufficient information to code with codable children: Secondary | ICD-10-CM

## 2019-01-02 DIAGNOSIS — E1151 Type 2 diabetes mellitus with diabetic peripheral angiopathy without gangrene: Secondary | ICD-10-CM

## 2019-01-02 DIAGNOSIS — I10 Essential (primary) hypertension: Secondary | ICD-10-CM

## 2019-01-14 DIAGNOSIS — J301 Allergic rhinitis due to pollen: Secondary | ICD-10-CM | POA: Diagnosis not present

## 2019-01-14 DIAGNOSIS — J3089 Other allergic rhinitis: Secondary | ICD-10-CM | POA: Diagnosis not present

## 2019-01-14 DIAGNOSIS — J3081 Allergic rhinitis due to animal (cat) (dog) hair and dander: Secondary | ICD-10-CM | POA: Diagnosis not present

## 2019-02-08 ENCOUNTER — Other Ambulatory Visit: Payer: Self-pay | Admitting: Family Medicine

## 2019-02-08 DIAGNOSIS — E039 Hypothyroidism, unspecified: Secondary | ICD-10-CM

## 2019-02-16 DIAGNOSIS — J301 Allergic rhinitis due to pollen: Secondary | ICD-10-CM | POA: Diagnosis not present

## 2019-02-16 DIAGNOSIS — J3089 Other allergic rhinitis: Secondary | ICD-10-CM | POA: Diagnosis not present

## 2019-02-16 DIAGNOSIS — J3081 Allergic rhinitis due to animal (cat) (dog) hair and dander: Secondary | ICD-10-CM | POA: Diagnosis not present

## 2019-02-19 DIAGNOSIS — J3089 Other allergic rhinitis: Secondary | ICD-10-CM | POA: Diagnosis not present

## 2019-02-19 DIAGNOSIS — J301 Allergic rhinitis due to pollen: Secondary | ICD-10-CM | POA: Diagnosis not present

## 2019-02-22 DIAGNOSIS — J3081 Allergic rhinitis due to animal (cat) (dog) hair and dander: Secondary | ICD-10-CM | POA: Diagnosis not present

## 2019-02-22 DIAGNOSIS — J301 Allergic rhinitis due to pollen: Secondary | ICD-10-CM | POA: Diagnosis not present

## 2019-02-22 DIAGNOSIS — J3089 Other allergic rhinitis: Secondary | ICD-10-CM | POA: Diagnosis not present

## 2019-02-26 DIAGNOSIS — J301 Allergic rhinitis due to pollen: Secondary | ICD-10-CM | POA: Diagnosis not present

## 2019-02-26 DIAGNOSIS — J3089 Other allergic rhinitis: Secondary | ICD-10-CM | POA: Diagnosis not present

## 2019-03-01 DIAGNOSIS — J3089 Other allergic rhinitis: Secondary | ICD-10-CM | POA: Diagnosis not present

## 2019-03-01 DIAGNOSIS — J3081 Allergic rhinitis due to animal (cat) (dog) hair and dander: Secondary | ICD-10-CM | POA: Diagnosis not present

## 2019-03-01 DIAGNOSIS — J301 Allergic rhinitis due to pollen: Secondary | ICD-10-CM | POA: Diagnosis not present

## 2019-03-06 ENCOUNTER — Other Ambulatory Visit: Payer: Self-pay | Admitting: Family Medicine

## 2019-03-06 DIAGNOSIS — K219 Gastro-esophageal reflux disease without esophagitis: Secondary | ICD-10-CM

## 2019-03-06 DIAGNOSIS — E1151 Type 2 diabetes mellitus with diabetic peripheral angiopathy without gangrene: Secondary | ICD-10-CM

## 2019-03-09 ENCOUNTER — Other Ambulatory Visit: Payer: Self-pay | Admitting: *Deleted

## 2019-03-09 DIAGNOSIS — E1139 Type 2 diabetes mellitus with other diabetic ophthalmic complication: Secondary | ICD-10-CM

## 2019-03-09 MED ORDER — ALENDRONATE SODIUM 70 MG PO TABS: ORAL_TABLET | ORAL | 0 refills | Status: DC

## 2019-03-09 MED ORDER — NATEGLINIDE 60 MG PO TABS: 60.0000 mg | ORAL_TABLET | Freq: Three times a day (TID) | ORAL | 0 refills | Status: DC

## 2019-03-15 ENCOUNTER — Ambulatory Visit (INDEPENDENT_AMBULATORY_CARE_PROVIDER_SITE_OTHER): Payer: Medicare Other | Admitting: Family Medicine

## 2019-03-15 ENCOUNTER — Encounter: Payer: Self-pay | Admitting: Family Medicine

## 2019-03-15 ENCOUNTER — Other Ambulatory Visit: Payer: Self-pay

## 2019-03-15 VITALS — BP 142/84 | HR 89 | Temp 97.8°F | Resp 18 | Ht 62.0 in | Wt 135.0 lb

## 2019-03-15 DIAGNOSIS — E1169 Type 2 diabetes mellitus with other specified complication: Secondary | ICD-10-CM

## 2019-03-15 DIAGNOSIS — I10 Essential (primary) hypertension: Secondary | ICD-10-CM | POA: Diagnosis not present

## 2019-03-15 DIAGNOSIS — E1165 Type 2 diabetes mellitus with hyperglycemia: Secondary | ICD-10-CM

## 2019-03-15 DIAGNOSIS — E1139 Type 2 diabetes mellitus with other diabetic ophthalmic complication: Secondary | ICD-10-CM

## 2019-03-15 DIAGNOSIS — M8589 Other specified disorders of bone density and structure, multiple sites: Secondary | ICD-10-CM

## 2019-03-15 DIAGNOSIS — E1151 Type 2 diabetes mellitus with diabetic peripheral angiopathy without gangrene: Secondary | ICD-10-CM

## 2019-03-15 DIAGNOSIS — E785 Hyperlipidemia, unspecified: Secondary | ICD-10-CM

## 2019-03-15 DIAGNOSIS — E039 Hypothyroidism, unspecified: Secondary | ICD-10-CM

## 2019-03-15 DIAGNOSIS — IMO0002 Reserved for concepts with insufficient information to code with codable children: Secondary | ICD-10-CM

## 2019-03-15 LAB — COMPREHENSIVE METABOLIC PANEL
ALT: 20 U/L (ref 0–35)
AST: 16 U/L (ref 0–37)
Albumin: 4.3 g/dL (ref 3.5–5.2)
Alkaline Phosphatase: 26 U/L — ABNORMAL LOW (ref 39–117)
BUN: 27 mg/dL — ABNORMAL HIGH (ref 6–23)
CO2: 25 mEq/L (ref 19–32)
Calcium: 9.6 mg/dL (ref 8.4–10.5)
Chloride: 100 mEq/L (ref 96–112)
Creatinine, Ser: 1.1 mg/dL (ref 0.40–1.20)
GFR: 48.36 mL/min — ABNORMAL LOW (ref >60.00)
Glucose, Bld: 142 mg/dL — ABNORMAL HIGH (ref 70–99)
Potassium: 4.7 mEq/L (ref 3.5–5.1)
Sodium: 137 mEq/L (ref 135–145)
Total Bilirubin: 0.5 mg/dL (ref 0.2–1.2)
Total Protein: 7.2 g/dL (ref 6.0–8.3)

## 2019-03-15 LAB — MICROALBUMIN / CREATININE URINE RATIO
Creatinine,U: 21.9 mg/dL
Microalb Creat Ratio: 9.5 mg/g (ref 0.0–30.0)
Microalb, Ur: 2.1 mg/dL — ABNORMAL HIGH (ref 0.0–1.9)

## 2019-03-15 LAB — LIPID PANEL
Cholesterol: 123 mg/dL (ref 0–200)
HDL: 43.8 mg/dL (ref >39.00)
LDL Cholesterol: 53 mg/dL (ref 0–99)
NonHDL: 78.88
Total CHOL/HDL Ratio: 3
Triglycerides: 130 mg/dL (ref 0.0–149.0)
VLDL: 26 mg/dL (ref 0.0–40.0)

## 2019-03-15 LAB — HEMOGLOBIN A1C: Hgb A1c MFr Bld: 6.4 % (ref 4.6–6.5)

## 2019-03-15 LAB — TSH: TSH: 0.63 u[IU]/mL (ref 0.35–4.50)

## 2019-03-15 MED ORDER — ALENDRONATE SODIUM 70 MG PO TABS: ORAL_TABLET | ORAL | 3 refills | Status: DC

## 2019-03-15 MED ORDER — NATEGLINIDE 60 MG PO TABS: 60.0000 mg | ORAL_TABLET | Freq: Three times a day (TID) | ORAL | 1 refills | Status: DC

## 2019-03-15 MED ORDER — LEVOTHYROXINE SODIUM 100 MCG PO TABS: 100.0000 ug | ORAL_TABLET | Freq: Every day | ORAL | 3 refills | Status: DC

## 2019-03-15 MED ORDER — OLMESARTAN MEDOXOMIL 40 MG PO TABS: 40.0000 mg | ORAL_TABLET | Freq: Every day | ORAL | 1 refills | Status: DC

## 2019-03-15 MED ORDER — METFORMIN HCL 1000 MG PO TABS: 1000.0000 mg | ORAL_TABLET | Freq: Two times a day (BID) | ORAL | 1 refills | Status: DC

## 2019-03-15 MED ORDER — PRAVASTATIN SODIUM 10 MG PO TABS: 10.0000 mg | ORAL_TABLET | Freq: Every day | ORAL | 1 refills | Status: DC

## 2019-03-15 NOTE — Assessment & Plan Note (Signed)
Check labs con't meds 

## 2019-03-15 NOTE — Patient Instructions (Signed)
Carbohydrate Counting for Diabetes Mellitus, Adult  Carbohydrate counting is a method of keeping track of how many carbohydrates you eat. Eating carbohydrates naturally increases the amount of sugar (glucose) in the blood. Counting how many carbohydrates you eat helps keep your blood glucose within normal limits, which helps you manage your diabetes (diabetes mellitus). It is important to know how many carbohydrates you can safely have in each meal. This is different for every person. A diet and nutrition specialist (registered dietitian) can help you make a meal plan and calculate how many carbohydrates you should have at each meal and snack. Carbohydrates are found in the following foods:  Grains, such as breads and cereals.  Dried beans and soy products.  Starchy vegetables, such as potatoes, peas, and corn.  Fruit and fruit juices.  Milk and yogurt.  Sweets and snack foods, such as cake, cookies, candy, chips, and soft drinks. How do I count carbohydrates? There are two ways to count carbohydrates in food. You can use either of the methods or a combination of both. Reading "Nutrition Facts" on packaged food The "Nutrition Facts" list is included on the labels of almost all packaged foods and beverages in the [place] It includes:  The serving size.  Information about nutrients in each serving, including the grams (g) of carbohydrate per serving. To use the "Nutrition Facts":  Decide how many servings you will have.  Multiply the number of servings by the number of carbohydrates per serving.  The resulting number is the total amount of carbohydrates that you will be having. Learning standard serving sizes of other foods When you eat carbohydrate foods that are not packaged or do not include "Nutrition Facts" on the label, you need to measure the servings in order to count the amount of carbohydrates:  Measure the foods that you will eat with a food scale or measuring cup, if needed.   Decide how many standard-size servings you will eat.  Multiply the number of servings by 15. Most carbohydrate-rich foods have about 15 g of carbohydrates per serving. ? For example, if you eat 8 oz (170 g) of strawberries, you will have eaten 2 servings and 30 g of carbohydrates (2 servings x 15 g = 30 g).  For foods that have more than one food mixed, such as soups and casseroles, you must count the carbohydrates in each food that is included. The following list contains standard serving sizes of common carbohydrate-rich foods. Each of these servings has about 15 g of carbohydrates:   hamburger bun or  English muffin.   oz (15 mL) syrup.   oz (14 g) jelly.  1 slice of bread.  1 six-inch tortilla.  3 oz (85 g) cooked rice or pasta.  4 oz (113 g) cooked dried beans.  4 oz (113 g) starchy vegetable, such as peas, corn, or potatoes.  4 oz (113 g) hot cereal.  4 oz (113 g) mashed potatoes or  of a large baked potato.  4 oz (113 g) canned or frozen fruit.  4 oz (120 mL) fruit juice.  4-6 crackers.  6 chicken nuggets.  6 oz (170 g) unsweetened dry cereal.  6 oz (170 g) plain fat-free yogurt or yogurt sweetened with artificial sweeteners.  8 oz (240 mL) milk.  8 oz (170 g) fresh fruit or one small piece of fruit.  24 oz (680 g) popped popcorn. Example of carbohydrate counting Sample meal  3 oz (85 g) chicken breast.  6 oz (170 g)   brown rice.  4 oz (113 g) corn.  8 oz (240 mL) milk.  8 oz (170 g) strawberries with sugar-free whipped topping. Carbohydrate calculation 1. Identify the foods that contain carbohydrates: ? Rice. ? Corn. ? Milk. ? Strawberries. 2. Calculate how many servings you have of each food: ? 2 servings rice. ? 1 serving corn. ? 1 serving milk. ? 1 serving strawberries. 3. Multiply each number of servings by 15 g: ? 2 servings rice x 15 g = 30 g. ? 1 serving corn x 15 g = 15 g. ? 1 serving milk x 15 g = 15 g. ? 1 serving  strawberries x 15 g = 15 g. 4. Add together all of the amounts to find the total grams of carbohydrates eaten: ? 30 g + 15 g + 15 g + 15 g = 75 g of carbohydrates total. Summary  Carbohydrate counting is a method of keeping track of how many carbohydrates you eat.  Eating carbohydrates naturally increases the amount of sugar (glucose) in the blood.  Counting how many carbohydrates you eat helps keep your blood glucose within normal limits, which helps you manage your diabetes.  A diet and nutrition specialist (registered dietitian) can help you make a meal plan and calculate how many carbohydrates you should have at each meal and snack. This information is not intended to replace advice given to you by your health care provider. Make sure you discuss any questions you have with your health care provider. Document Released: 04/08/2005 Document Revised: 10/31/2016 Document Reviewed: 09/20/2015 Elsevier Patient Education  2020 [employer]  

## 2019-03-15 NOTE — Progress Notes (Signed)
Patient ID: Cheryl Cisneros, female    DOB: November 11, 1945  Age: 73 y.o. MRN: [id number]    Subjective:  Subjective  HPI Cheryl Cisneros presents for f/u bp, chol and dm.     HYPERTENSION   Blood pressure range-readings at home are good  Chest pain- no      Dyspnea- no Lightheadedness- no   Edema- no  Other side effects - no   Medication compliance: good Low salt diet- yes     DIABETES    Blood Sugar ranges-139-190  Polyuria- no New Visual problems- no  Hypoglycemic symptoms- no  Other side effects-no Medication compliance - good Last eye exam- app next month Foot exam- today   HYPERLIPIDEMIA  Medication compliance- good RUQ pain- no  Muscle aches- no Other side effects-no      Review of Systems  Constitutional: Negative for appetite change, diaphoresis, fatigue and unexpected weight change.  Eyes: Negative for pain, redness and visual disturbance.  Respiratory: Negative for cough, chest tightness, shortness of breath and wheezing.   Cardiovascular: Negative for chest pain, palpitations and leg swelling.  Endocrine: Negative for cold intolerance, heat intolerance, polydipsia, polyphagia and polyuria.  Genitourinary: Negative for difficulty urinating, dysuria and frequency.  Neurological: Negative for dizziness, light-headedness, numbness and headaches.    History Past Medical History:  Diagnosis Date   Allergy    Arthritis    Asthma    Borderline glaucoma    Cataract    Diabetes ([place])    GERD (gastroesophageal reflux disease)    Hypertension    Macular degeneration 12/25/2011   Osteopenia    Thyroid disease    Hypothyroidism    Cheryl Cisneros has a past surgical history that includes Tonsillectomy (1963); Knee arthroscopy (Right, 2002); Lumbar laminectomy (1999); Rotator cuff repair (Right, 02/27/2005); Abdominal hysterectomy (1986); Back surgery (2016); and Eye surgery (Right, 07/28/2017).   Her family history includes Cancer in her father; Cancer (age of  onset: 59) in her daughter; Cervical cancer in her daughter; Coronary artery disease in an other family member; Dementia in her sister; Diabetes in her brother, father, mother, sister, and sister; Hyperlipidemia in her brother, sister, and sister; Hypertension in her brother, father, mother, sister, and sister; Kidney disease in her mother; Lung cancer in an other family member; Other in her sister.Cheryl Cisneros reports that Cheryl Cisneros has never smoked. Cheryl Cisneros has never used smokeless tobacco. Cheryl Cisneros reports that Cheryl Cisneros does not drink alcohol or use drugs.  Current Outpatient Medications on File Prior to Visit  Medication Sig Dispense Refill   aspirin EC 81 MG tablet Take 1 tablet (81 mg total) by mouth daily.     Biotin 1000 MCG tablet Take 5,000 mcg by mouth 2 (two) times daily.      Calcium-Vitamin D 600-200 MG-UNIT per tablet Take 1 tablet by mouth 3 (three) times daily with meals.       Cholecalciferol (VITAMIN D3) 1000 UNITS CAPS Take 1 capsule by mouth daily.       EPIPEN 2-PAK 0.3 MG/0.3ML DEVI Reported on 07/26/2015     EQ ALLERGY RELIEF, CETIRIZINE, 10 MG tablet Take 1 tablet by mouth once daily 90 tablet 0   Garlic Oil (SUPER GARLIC) 1779 MG CAPS Take 1 capsule by mouth daily.      glucose blood (ONETOUCH ULTRA) test strip Use strip to check glucose twice a day.  Dx code: E11.9.  Needs ov 200 each 0   L-Lysine 500 MG TABS Take 1 tablet by mouth daily.  Magnesium 250 MG TABS Take 1 tablet by mouth daily.      Omega-3 Fatty Acids (OMEGA 3 PO) Take 2 tablets by mouth daily.      omeprazole (PRILOSEC) 40 MG capsule Take 1 capsule by mouth once daily 30 capsule 0   OneTouch Delica Lancets 60Y MISC USE TO CHECK GLUCOSE TWICE DAILY.  Dx Code: E11.9 200 each 1   OVER THE COUNTER MEDICATION Maxi-vision 2 capsules per day     SIMPLY SALINE NA Place 1 spray into the nose daily.       Turmeric 500 MG CAPS Take 1 capsule by mouth 3 (three) times daily.      vitamin C (ASCORBIC ACID) 500 MG tablet Take  1,000 mg by mouth daily.     amLODipine (NORVASC) 2.5 MG tablet Take 1 tablet (2.5 mg total) by mouth daily. Needs ov (Patient not taking: Reported on 03/15/2019) 90 tablet 0   olmesartan (BENICAR) 20 MG tablet Take 1 tablet (20 mg total) by mouth daily. (Patient not taking: Reported on 03/15/2019) 90 tablet 3   No current facility-administered medications on file prior to visit.      Objective:  Objective  Physical Exam Vitals signs and nursing note reviewed.  Constitutional:      Appearance: Cheryl Cisneros is well-developed.  HENT:     Head: Normocephalic and atraumatic.  Eyes:     Conjunctiva/sclera: Conjunctivae normal.  Neck:     Musculoskeletal: Normal range of motion and neck supple.     Thyroid: No thyromegaly.     Vascular: No carotid bruit or JVD.  Cardiovascular:     Rate and Rhythm: Normal rate and regular rhythm.     Heart sounds: Normal heart sounds. No murmur.  Pulmonary:     Effort: Pulmonary effort is normal. No respiratory distress.     Breath sounds: Normal breath sounds. No wheezing or rales.  Chest:     Chest wall: No tenderness.  Neurological:     Mental Status: Cheryl Cisneros is alert and oriented to person, place, and time.    Diabetic Foot Exam - Simple   No data filed      BP (!) 142/84 (BP Location: Left Arm, Patient Position: Sitting, Cuff Size: Normal)    Pulse 89    Temp 97.8 F (36.6 C) (Temporal)    Resp 18    Ht 5\' 2"  (1.575 m)    Wt 135 lb (61.2 kg)    SpO2 98%    BMI 24.69 kg/m  Wt Readings from Last 3 Encounters:  03/15/19 135 lb (61.2 kg)  04/28/18 140 lb 12.8 oz (63.9 kg)  11/10/17 138 lb 12.8 oz (63 kg)     Lab Results  Component Value Date   WBC 6.0 05/28/2016   HGB 13.8 05/28/2016   HCT 40.4 05/28/2016   PLT 235.0 05/28/2016   GLUCOSE 140 (H) 04/28/2018   CHOL 127 04/28/2018   TRIG 121.0 04/28/2018   HDL 47.00 04/28/2018   LDLCALC 56 04/28/2018   ALT 25 04/28/2018   AST 20 04/28/2018   NA 137 04/28/2018   K 4.0 04/28/2018   CL 101  04/28/2018   CREATININE 1.10 04/28/2018   BUN 26 (H) 04/28/2018   CO2 27 04/28/2018   TSH 0.74 07/11/2017   HGBA1C 6.5 04/28/2018   MICROALBUR 1.2 04/28/2018    Mr Lumbar Spine Wo Contrast  Result Date: 12/02/2015 CLINICAL DATA:  Initial evaluation for two-month history of low back pain, radiating into left lower  extremity. Worse with sitting. History of prior surgery. EXAM: MRI LUMBAR SPINE WITHOUT CONTRAST TECHNIQUE: Multiplanar, multisequence MR imaging of the lumbar spine was performed. No intravenous contrast was administered. COMPARISON:  Prior MRI from 07/23/2014. FINDINGS: Segmentation: Normal segmentation. Lowest well-formed this is presumed to be the L5-S1 level. Alignment: Stable alignment with preservation of the normal lumbar lordosis. No listhesis or subluxation. Vertebrae: Vertebral body heights are maintained. No acute or chronic fracture. Signal intensity within the vertebral body bone marrow is normal. Benign hemangioma noted within the L4 vertebral body. No marrow edema. Conus medullaris: Extends to the L1 level and appears normal. Paraspinal and other soft tissues: Paraspinous soft tissues demonstrate no acute abnormality. Visualized visceral structures within normal limits. No retroperitoneal adenopathy. Intra-abdominal aorta of normal caliber. Disc levels: No significant degenerative changes are seen from the T11-12 through the L2-3 levels. L3-4: Diffuse disc bulge with disc desiccation. Superimposed shallow right foraminal disc protrusion abutting the exiting left L3 nerve root as it courses out of the left neural foramen (series 5, image 17). Overall, this is similar to previous study. Superimposed mild facet and ligamentum flavum hypertrophy. No significant canal or foraminal encroachment. L4-5: Interval performance of a decompressive left hemi laminectomy has been performed at this level since the previous exam. Persistent disc bulge with disc desiccation and intervertebral disc  space narrowing. There is a persistent left subarticular disc protrusion extending into the left lateral recess, potentially affecting the left L5 nerve root. This appears slightly more prominent as compared to prior study, and is suspicious for possible recurrent disc herniation. Disc bulge again extends into the left neural foramen without frank impingement of the left L4 nerve root. Superimposed facet arthrosis with mild ligamentum flavum hypertrophy. No significant canal stenosis. Minimal right foraminal encroachment related to disc bulge and facet disease. L5-S1: Remote left laminectomy defect again noted. Degenerative disc desiccation with intervertebral disc space narrowing and diffuse disc bulge. Superimposed osteophytic endplate spurring. Mild left-sided facet arthrosis. Previously noted fibrosis surrounding the S1 nerve root not well appreciated on this noncontrast examination, but is likely unchanged. No significant canal stenosis. Mild left foraminal stenosis, stable. IMPRESSION: 1. Left subarticular disc protrusion, slightly more prominent as compared to preoperative MRI from 2016, and suspicious for possible recurrent disc herniation. This potentially affects the transiting left L5 nerve root in the left lateral recess. Postoperative sequela from interval decompressive left hemi laminectomy at this level. 2. Stable small left extra foraminal disc protrusion, contacting the exiting left L3 nerve root. 3. Stable postsurgical changes on the left at L5-S1 without new or recurrent stenosis. Electronically Signed   By: Jeannine Boga M.D.   On: 12/02/2015 22:18     Assessment & Plan:  Plan  I have changed Deshea L. Gadomski's olmesartan, metFORMIN, and levothyroxine. I am also having her maintain her Calcium-Vitamin D, L-Lysine, Magnesium, Omega-3 Fatty Acids (OMEGA 3 PO), Garlic Oil, Vitamin D3, SIMPLY SALINE NA, EpiPen 2-Pak, Biotin, OVER THE COUNTER MEDICATION, Turmeric, vitamin C, aspirin EC,  olmesartan, OneTouch Delica Lancets 60F, OneTouch Ultra, amLODipine, EQ Allergy Relief (Cetirizine), omeprazole, pravastatin, nateglinide, and alendronate.  Meds ordered this encounter  Medications   pravastatin (PRAVACHOL) 10 MG tablet    Sig: Take 1 tablet (10 mg total) by mouth daily. Needs ov    Dispense:  90 tablet    Refill:  1   olmesartan (BENICAR) 40 MG tablet    Sig: Take 1 tablet (40 mg total) by mouth daily.    Dispense:  90 tablet  Refill:  1   nateglinide (STARLIX) 60 MG tablet    Sig: Take 1 tablet (60 mg total) by mouth 3 (three) times daily with meals.    Dispense:  270 tablet    Refill:  1   metFORMIN (GLUCOPHAGE) 1000 MG tablet    Sig: Take 1 tablet (1,000 mg total) by mouth 2 (two) times daily with a meal.    Dispense:  180 tablet    Refill:  1    Needs appt   levothyroxine (SYNTHROID) 100 MCG tablet    Sig: Take 1 tablet (100 mcg total) by mouth daily.    Dispense:  90 tablet    Refill:  3   alendronate (FOSAMAX) 70 MG tablet    Sig: TAKE ONE TABLET BY MOUTH ONCE A WEEK IN THE MORNING WITH A FULL GLASS OF WATER, 30 MINUTES BEFORE A MEAL OR BEVERAGE. REMAIN UPRIGHT    Dispense:  12 tablet    Refill:  3    Please consider 90 day supplies to promote better adherence    Problem List Items Addressed This Visit      Unprioritized   DM (diabetes mellitus) type II uncontrolled, periph vascular disorder ([place])    Check labs today con't mes hgba1c to be checked, minimize simple carbs. Increase exercise as tolerated. Continue current meds       Relevant Medications   pravastatin (PRAVACHOL) 10 MG tablet   olmesartan (BENICAR) 40 MG tablet   nateglinide (STARLIX) 60 MG tablet   metFORMIN (GLUCOPHAGE) 1000 MG tablet   Essential hypertension    Well controlled, no changes to meds. Encouraged heart healthy diet such as the DASH diet and exercise as tolerated.       Relevant Medications   pravastatin (PRAVACHOL) 10 MG tablet   olmesartan (BENICAR) 40  MG tablet   Hyperlipidemia associated with type 2 diabetes mellitus (H[facility])   Relevant Medications   pravastatin (PRAVACHOL) 10 MG tablet   olmesartan (BENICAR) 40 MG tablet   nateglinide (STARLIX) 60 MG tablet   metFORMIN (GLUCOPHAGE) 1000 MG tablet   Other Relevant Orders   Lipid panel   Comprehensive metabolic panel   Hypothyroidism - Primary    Check labs con't meds       Relevant Medications   levothyroxine (SYNTHROID) 100 MCG tablet   Other Relevant Orders   TSH    Other Visit Diagnoses    Type 2 diabetes mellitus with hyperglycemia, without long-term current use of insulin (H[facility])       Relevant Medications   pravastatin (PRAVACHOL) 10 MG tablet   olmesartan (BENICAR) 40 MG tablet   nateglinide (STARLIX) 60 MG tablet   metFORMIN (GLUCOPHAGE) 1000 MG tablet   Other Relevant Orders   Hemoglobin A1c   Microalbumin / creatinine urine ratio   Hyperlipidemia LDL goal <70       Relevant Medications   pravastatin (PRAVACHOL) 10 MG tablet   olmesartan (BENICAR) 40 MG tablet   Type 2 diabetes mellitus with other ophthalmic complication, without long-term current use of insulin (HCC)       Relevant Medications   pravastatin (PRAVACHOL) 10 MG tablet   olmesartan (BENICAR) 40 MG tablet   nateglinide (STARLIX) 60 MG tablet   metFORMIN (GLUCOPHAGE) 1000 MG tablet   DM (diabetes mellitus) type II, controlled, with peripheral vascular disorder (H[facility])       Relevant Medications   pravastatin (PRAVACHOL) 10 MG tablet   olmesartan (BENICAR) 40 MG tablet   nateglinide (STARLIX) 60  MG tablet   metFORMIN (GLUCOPHAGE) 1000 MG tablet   Osteopenia of multiple sites       Relevant Medications   alendronate (FOSAMAX) 70 MG tablet   Other Relevant Orders   DG Bone Density      Follow-up: Return in about 6 months (around 09/12/2019), or if symptoms worsen or fail to improve, for annual exam, fasting.  Ann Held, DO

## 2019-03-15 NOTE — Telephone Encounter (Signed)
Erroneous

## 2019-03-15 NOTE — Assessment & Plan Note (Signed)
Well controlled, no changes to meds. Encouraged heart healthy diet such as the DASH diet and exercise as tolerated.  °

## 2019-03-15 NOTE — Assessment & Plan Note (Signed)
Check labs today con't mes hgba1c to be checked, minimize simple carbs. Increase exercise as tolerated. Continue current meds

## 2019-03-22 DIAGNOSIS — J3081 Allergic rhinitis due to animal (cat) (dog) hair and dander: Secondary | ICD-10-CM | POA: Diagnosis not present

## 2019-03-22 DIAGNOSIS — J301 Allergic rhinitis due to pollen: Secondary | ICD-10-CM | POA: Diagnosis not present

## 2019-03-22 DIAGNOSIS — J3089 Other allergic rhinitis: Secondary | ICD-10-CM | POA: Diagnosis not present

## 2019-03-30 DIAGNOSIS — E119 Type 2 diabetes mellitus without complications: Secondary | ICD-10-CM | POA: Diagnosis not present

## 2019-03-30 DIAGNOSIS — H524 Presbyopia: Secondary | ICD-10-CM | POA: Diagnosis not present

## 2019-04-03 ENCOUNTER — Other Ambulatory Visit: Payer: Self-pay | Admitting: Family Medicine

## 2019-04-03 DIAGNOSIS — IMO0002 Reserved for concepts with insufficient information to code with codable children: Secondary | ICD-10-CM

## 2019-04-03 DIAGNOSIS — E1151 Type 2 diabetes mellitus with diabetic peripheral angiopathy without gangrene: Secondary | ICD-10-CM

## 2019-04-09 ENCOUNTER — Other Ambulatory Visit: Payer: Self-pay | Admitting: Family Medicine

## 2019-04-09 DIAGNOSIS — K219 Gastro-esophageal reflux disease without esophagitis: Secondary | ICD-10-CM

## 2019-04-09 NOTE — Telephone Encounter (Signed)
Last OV 03/15/19 Last refill 03/08/19 #30/0 Next OV 09/13/19

## 2019-04-09 NOTE — Telephone Encounter (Signed)
Pharmacy called to ask the doctor to give the code for the patient's prescription so that her insurance can pay for it.  Please advise and call patient when approved.  [phone]

## 2019-04-21 MED ORDER — ONETOUCH ULTRA VI STRP: ORAL_STRIP | 12 refills | Status: DC

## 2019-04-21 NOTE — Telephone Encounter (Signed)
New order sent with Dx code

## 2019-04-21 NOTE — Addendum Note (Signed)
Addended by: Sanda Linger on: 04/21/2019 04:30 PM   Modules accepted: Orders

## 2019-04-21 NOTE — Telephone Encounter (Signed)
glucose blood (ONETOUCH ULTRA) test strip  [facility], [place] Phone:  [phone]  Fax:  [phone]     Dr Carollee Herter has called these in but pt drugstore is wanting a diagnostic code in order for the insurance co to pay. Please FU with pharmacy as she is out of strips.

## 2019-04-26 ENCOUNTER — Telehealth: Payer: Self-pay | Admitting: Family Medicine

## 2019-04-26 DIAGNOSIS — IMO0002 Reserved for concepts with insufficient information to code with codable children: Secondary | ICD-10-CM

## 2019-04-26 DIAGNOSIS — E1151 Type 2 diabetes mellitus with diabetic peripheral angiopathy without gangrene: Secondary | ICD-10-CM

## 2019-04-26 NOTE — Telephone Encounter (Signed)
[place] calling from [employer] [employer] is calling to request a new script for glucose blood (ONETOUCH ULTRA) test strip [[id number]] due to the new medicare guidelines the patient is not insulin dependent. Needing new script for the one touch ultra one time a day.  [phone]

## 2019-04-27 MED ORDER — ONETOUCH ULTRA VI STRP: ORAL_STRIP | 12 refills | Status: DC

## 2019-04-27 NOTE — Telephone Encounter (Signed)
Refilled

## 2019-04-27 NOTE — Telephone Encounter (Signed)
New Rx sent with correct sig

## 2019-04-27 NOTE — Addendum Note (Signed)
Addended by: Sanda Linger on: 04/27/2019 01:52 PM   Modules accepted: Orders

## 2019-04-27 NOTE — Telephone Encounter (Signed)
Pharmacy calling back, states RX was sent incorrectly. It needs to state x1 a day, rather than x2 a day.

## 2019-04-29 ENCOUNTER — Other Ambulatory Visit: Payer: Self-pay | Admitting: Family Medicine

## 2019-05-03 ENCOUNTER — Encounter: Payer: Self-pay | Admitting: Family Medicine

## 2019-05-03 DIAGNOSIS — Z1231 Encounter for screening mammogram for malignant neoplasm of breast: Secondary | ICD-10-CM | POA: Diagnosis not present

## 2019-05-03 DIAGNOSIS — M8589 Other specified disorders of bone density and structure, multiple sites: Secondary | ICD-10-CM | POA: Diagnosis not present

## 2019-05-03 LAB — HM DEXA SCAN

## 2019-05-21 DIAGNOSIS — J3089 Other allergic rhinitis: Secondary | ICD-10-CM | POA: Diagnosis not present

## 2019-05-21 DIAGNOSIS — J301 Allergic rhinitis due to pollen: Secondary | ICD-10-CM | POA: Diagnosis not present

## 2019-05-21 DIAGNOSIS — J3081 Allergic rhinitis due to animal (cat) (dog) hair and dander: Secondary | ICD-10-CM | POA: Diagnosis not present

## 2019-06-21 DIAGNOSIS — J3081 Allergic rhinitis due to animal (cat) (dog) hair and dander: Secondary | ICD-10-CM | POA: Diagnosis not present

## 2019-06-21 DIAGNOSIS — J301 Allergic rhinitis due to pollen: Secondary | ICD-10-CM | POA: Diagnosis not present

## 2019-06-21 DIAGNOSIS — J3089 Other allergic rhinitis: Secondary | ICD-10-CM | POA: Diagnosis not present

## 2019-06-29 ENCOUNTER — Other Ambulatory Visit: Payer: Self-pay | Admitting: Family Medicine

## 2019-06-29 DIAGNOSIS — I10 Essential (primary) hypertension: Secondary | ICD-10-CM

## 2019-07-15 DIAGNOSIS — L814 Other melanin hyperpigmentation: Secondary | ICD-10-CM | POA: Diagnosis not present

## 2019-07-15 DIAGNOSIS — L570 Actinic keratosis: Secondary | ICD-10-CM | POA: Diagnosis not present

## 2019-07-15 DIAGNOSIS — D2372 Other benign neoplasm of skin of left lower limb, including hip: Secondary | ICD-10-CM | POA: Diagnosis not present

## 2019-07-15 DIAGNOSIS — L821 Other seborrheic keratosis: Secondary | ICD-10-CM | POA: Diagnosis not present

## 2019-07-15 DIAGNOSIS — D225 Melanocytic nevi of trunk: Secondary | ICD-10-CM | POA: Diagnosis not present

## 2019-07-19 DIAGNOSIS — J3089 Other allergic rhinitis: Secondary | ICD-10-CM | POA: Diagnosis not present

## 2019-07-19 DIAGNOSIS — J3081 Allergic rhinitis due to animal (cat) (dog) hair and dander: Secondary | ICD-10-CM | POA: Diagnosis not present

## 2019-07-19 DIAGNOSIS — J301 Allergic rhinitis due to pollen: Secondary | ICD-10-CM | POA: Diagnosis not present

## 2019-07-24 ENCOUNTER — Other Ambulatory Visit: Payer: Self-pay | Admitting: Family Medicine

## 2019-08-18 ENCOUNTER — Encounter: Payer: Self-pay | Admitting: Pharmacist

## 2019-08-18 NOTE — Progress Notes (Signed)
Subjective:   Cheryl Cisneros is a 74 y.o. female who presents for Medicare Annual (Subsequent) preventive examination.  Enjoys reading, bible studies, and gardening.  Review of Systems:  Home Safety/Smoke Alarms: Feels safe in home. Smoke alarms in place.  Lives w/ husband in 2 story home. Does well w/ stairs.   Female:       Mammo-  05/03/19   Dexa scan- 05/03/19       CCS- 08/09/15. Due 2022.    Objective:     Vitals: BP (!) 150/80 (BP Location: Left Arm, Patient Position: Sitting, Cuff Size: Normal)   Pulse 94   Temp (!) 97 F (36.1 C) (Temporal)   Ht 5\' 2"  (1.575 m)   Wt 130 lb 6.4 oz (59.1 kg)   SpO2 98%   BMI 23.85 kg/m   Body mass index is 23.85 kg/m.  Advanced Directives 08/19/2019 08/18/2018 08/15/2017 07/26/2015 06/05/2015  Does Patient Have a Medical Advance Directive? Yes Yes Yes Yes Yes  Type of Paramedic of [place];Living will [place];Living will [place];Living will Living will;Healthcare Power of [place];Living will  Does patient want to make changes to medical advance directive? No - Patient declined No - Patient declined No - Patient declined - No - Patient declined  Copy of [place] in Chart? No - copy requested No - copy requested No - copy requested - No - copy requested    Tobacco Social History   Tobacco Use  Smoking Status Never Smoker  Smokeless Tobacco Never Used     Counseling given: Not Answered   Clinical Intake:     Pain : No/denies pain                 Past Medical History:  Diagnosis Date  . Allergy   . Arthritis   . Asthma   . Borderline glaucoma   . Cataract   . Diabetes ([place])   . GERD (gastroesophageal reflux disease)   . Hypertension   . Macular degeneration 12/25/2011  . Osteopenia   . Thyroid disease    Hypothyroidism   Past Surgical History:  Procedure Laterality Date  . ABDOMINAL  HYSTERECTOMY  1986   TAH BSO  . BACK SURGERY  2016  . EYE SURGERY Right 07/28/2017   Dr.Beavis. Cataract removal  . KNEE ARTHROSCOPY Right 2002  . LUMBAR LAMINECTOMY  1999  . ROTATOR CUFF REPAIR Right 02/27/2005  . TONSILLECTOMY  1963   Family History  Problem Relation Age of Onset  . Diabetes Mother   . Hypertension Mother   . Kidney disease Mother        dialysis  . Diabetes Father   . Hypertension Father   . Cancer Father        lung  . Dementia Sister   . Diabetes Sister   . Hyperlipidemia Sister   . Hypertension Sister   . Diabetes Brother   . Hyperlipidemia Brother   . Hypertension Brother   . Diabetes Sister   . Hypertension Sister   . Hyperlipidemia Sister   . Other Sister        blood disorder  . Cervical cancer Daughter   . Cancer Daughter 73       cervical  . Coronary artery disease Other   . Lung cancer Other   . Colon cancer Neg Hx    Social History   Socioeconomic History  . Marital status:  Married    Spouse name: Not on file  . Number of children: Not on file  . Years of education: Not on file  . Highest education level: Not on file  Occupational History  . Occupation: housewife    Employer: UNEMPLOYED  Tobacco Use  . Smoking status: Never Smoker  . Smokeless tobacco: Never Used  Substance and Sexual Activity  . Alcohol use: No    Alcohol/week: 0.0 standard drinks  . Drug use: No  . Sexual activity: Not Currently    Partners: Male  Other Topics Concern  . Not on file  Social History Narrative   Exercise--  bootcamp 3 days a week and walking   Social Determinants of Health   Financial Resource Strain: Low Risk   . Difficulty of Paying Living Expenses: Not hard at all  Food Insecurity: No Food Insecurity  . Worried About Charity fundraiser in the Last Year: Never true  . Ran Out of Food in the Last Year: Never true  Transportation Needs: No Transportation Needs  . Lack of Transportation (Medical): No  . Lack of Transportation  (Non-Medical): No  Physical Activity:   . Days of Exercise per Week:   . Minutes of Exercise per Session:   Stress:   . Feeling of Stress :   Social Connections:   . Frequency of Communication with Friends and Family:   . Frequency of Social Gatherings with Friends and Family:   . Attends Religious Services:   . Active Member of Clubs or Organizations:   . Attends Archivist Meetings:   Marland Kitchen Marital Status:     Outpatient Encounter Medications as of 08/19/2019  Medication Sig  . alendronate (FOSAMAX) 70 MG tablet TAKE ONE TABLET BY MOUTH ONCE A WEEK IN THE MORNING WITH A FULL GLASS OF WATER, 30 MINUTES BEFORE A MEAL OR BEVERAGE. REMAIN UPRIGHT  . amLODipine (NORVASC) 2.5 MG tablet Take 1 tablet (2.5 mg total) by mouth daily. Needs ov  . aspirin EC 81 MG tablet Take 1 tablet (81 mg total) by mouth daily.  . Biotin 1000 MCG tablet Take 5,000 mcg by mouth 2 (two) times daily.   . Calcium-Vitamin D 600-200 MG-UNIT per tablet Take 1 tablet by mouth 3 (three) times daily with meals.    . Cholecalciferol (VITAMIN D3) 1000 UNITS CAPS Take 1 capsule by mouth daily.    Noelle Penner ALLERGY RELIEF, CETIRIZINE, 10 MG tablet Take 1 tablet by mouth once daily  . Garlic Oil (SUPER GARLIC) 3810 MG CAPS Take 1 capsule by mouth daily.   Marland Kitchen glucose blood (ONETOUCH ULTRA) test strip USE  STRIP TO CHECK GLUCOSE ONCE DAILY Dx code: E11.9  . L-Lysine 500 MG TABS Take 1 tablet by mouth daily.    . Lancets (ONETOUCH DELICA PLUS [id number]) MISC USE   TO CHECK GLUCOSE TWICE DAILY  . levothyroxine (SYNTHROID) 100 MCG tablet Take 1 tablet (100 mcg total) by mouth daily.  . Magnesium 250 MG TABS Take 1 tablet by mouth daily.   . metFORMIN (GLUCOPHAGE) 1000 MG tablet Take 1 tablet (1,000 mg total) by mouth 2 (two) times daily with a meal.  . nateglinide (STARLIX) 60 MG tablet Take 1 tablet (60 mg total) by mouth 3 (three) times daily with meals.  Marland Kitchen olmesartan (BENICAR) 20 MG tablet Take 1 tablet (20 mg total) by mouth  daily.  Marland Kitchen olmesartan (BENICAR) 40 MG tablet Take 1 tablet by mouth once daily  . Omega-3 Fatty Acids (OMEGA  3 PO) Take 2 tablets by mouth daily.   Marland Kitchen omeprazole (PRILOSEC) 40 MG capsule Take 1 capsule by mouth once daily  . OVER THE COUNTER MEDICATION Maxi-vision 2 capsules per day  . pravastatin (PRAVACHOL) 10 MG tablet Take 1 tablet (10 mg total) by mouth daily. Needs ov  . SIMPLY SALINE NA Place 1 spray into the nose daily.    . Turmeric 500 MG CAPS Take 1 capsule by mouth 3 (three) times daily.   . vitamin C (ASCORBIC ACID) 500 MG tablet Take 1,000 mg by mouth daily.  Marland Kitchen EPIPEN 2-PAK 0.3 MG/0.3ML DEVI Reported on 07/26/2015   No facility-administered encounter medications on file as of 08/19/2019.    Activities of Daily Living In your present state of health, do you have any difficulty performing the following activities: 08/19/2019  Hearing? N  Vision? N  Difficulty concentrating or making decisions? N  Walking or climbing stairs? N  Dressing or bathing? N  Doing errands, shopping? N  Preparing Food and eating ? N  Using the Toilet? N  In the past six months, have you accidently leaked urine? N  Do you have problems with loss of bowel control? N  Managing your Medications? N  Managing your Finances? N  Housekeeping or managing your Housekeeping? N  Some recent data might be hidden    Patient Care Team: Carollee Herter, Alferd Apa, DO as PCP - General Harold Hedge, Darrick Grinder, MD as Consulting Physician (Allergy and Immunology) Odette Fraction as Consulting Physician (Optometry)    Assessment:   This is a routine wellness examination for [place]. Physical assessment deferred to PCP.  Exercise Activities and Dietary recommendations Current Exercise Habits: Home exercise routine, Type of exercise: strength training/weights;stretching;walking, Time (Minutes): 20, Frequency (Times/Week): 7, Weekly Exercise (Minutes/Week): 140, Intensity: Mild, Exercise limited by: None identified Diet  (meal preparation, eat out, water intake, caffeinated beverages, dairy products, fruits and vegetables): in general, a "healthy" diet  , well balanced    Goals    . Maintain healthy lifestyle.       Fall Risk Fall Risk  08/19/2019 08/18/2018 08/15/2017 03/27/2017 11/28/2015  Falls in the past year? 0 0 No Yes No  Comment - - - Emmi Telephone Survey: data to providers prior to load -  Number falls in past yr: 0 - - 1 -  Comment - - - Emmi Telephone Survey Actual Response = 1 -  Injury with Fall? 0 - - Yes -  Follow up Education provided;Falls prevention discussed - - - -   Depression Screen PHQ 2/9 Scores 08/19/2019 08/18/2018 08/15/2017 12/03/2016  PHQ - 2 Score 0 0 0 0     Cognitive Function Ad8 score reviewed for issues:  Issues making decisions:no  Less interest in hobbies / activities:no  Repeats questions, stories (family complaining):no  Trouble using ordinary gadgets (microwave, computer, phone):no  Forgets the month or year: no  Mismanaging finances: no  Remembering appts:no  Daily problems with thinking and/or memory:no Ad8 score is=0         Immunization History  Administered Date(s) Administered  . Fluad Quad(high Dose 65+) 12/09/2018  . Hep A / Hep B 12/09/2018, 01/12/2019  . Hepatitis A 07/30/2019  . Hepatitis B 07/30/2019  . Influenza Split 02/12/2011  . Influenza Whole 02/01/2009  . Influenza, High Dose Seasonal PF 02/06/2015, 01/04/2016  . Influenza,inj,Quad PF,6+ Mos 12/28/2013  . Influenza-Unspecified 01/23/2018  . Pneumococcal Conjugate-13 06/24/2013, 12/09/2018  . Pneumococcal Polysaccharide-23 12/29/2007, 12/13/2008  . Td  09/16/2001  . Tdap 06/28/2013  . Zoster 11/12/2007  . Zoster Recombinat (Shingrix) 11/12/2017, 01/23/2018   Screening Tests Health Maintenance  Topic Date Due  . COVID-19 Vaccine (1) Never done  . OPHTHALMOLOGY EXAM  04/02/2019  . FOOT EXAM  04/29/2019  . HEMOGLOBIN A1C  09/12/2019  . INFLUENZA VACCINE  11/21/2019  .  MAMMOGRAM  05/02/2020  . COLONOSCOPY  08/08/2020  . DEXA SCAN  05/02/2021  . TETANUS/TDAP  06/29/2023  . Hepatitis C Screening  Completed  . PNA vac Low Risk Adult  Completed       Plan:    Please schedule your next medicare wellness visit with me in 1 yr.  Continue to eat heart healthy diet (full of fruits, vegetables, whole grains, lean protein, water--limit salt, fat, and sugar intake) and increase physical activity as tolerated.  Continue doing brain stimulating activities (puzzles, reading, adult coloring books, staying active) to keep memory sharp.   Bring a copy of your living will and/or healthcare power of attorney to your next office visit.    I have personally reviewed and noted the following in the patient's chart:   . Medical and social history . Use of alcohol, tobacco or illicit drugs  . Current medications and supplements . Functional ability and status . Nutritional status . Physical activity . Advanced directives . List of other physicians . Hospitalizations, surgeries, and ER visits in previous 12 months . Vitals . Screenings to include cognitive, depression, and falls . Referrals and appointments  In addition, I have reviewed and discussed with patient certain preventive protocols, quality metrics, and best practice recommendations. A written personalized care plan for preventive services as well as general preventive health recommendations were provided to patient.     Naaman Plummer [place], [place]  08/19/2019

## 2019-08-19 ENCOUNTER — Encounter: Payer: Self-pay | Admitting: *Deleted

## 2019-08-19 ENCOUNTER — Ambulatory Visit (INDEPENDENT_AMBULATORY_CARE_PROVIDER_SITE_OTHER): Payer: Medicare Other | Admitting: *Deleted

## 2019-08-19 ENCOUNTER — Other Ambulatory Visit: Payer: Self-pay

## 2019-08-19 VITALS — BP 150/80 | HR 94 | Temp 97.0°F | Ht 62.0 in | Wt 130.4 lb

## 2019-08-19 DIAGNOSIS — Z0000 Encounter for general adult medical examination without abnormal findings: Secondary | ICD-10-CM | POA: Diagnosis not present

## 2019-08-19 NOTE — Patient Instructions (Signed)
Please schedule your next medicare wellness visit with me in 1 yr.  Continue to eat heart healthy diet (full of fruits, vegetables, whole grains, lean protein, water--limit salt, fat, and sugar intake) and increase physical activity as tolerated.  Continue doing brain stimulating activities (puzzles, reading, adult coloring books, staying active) to keep memory sharp.   Bring a copy of your living will and/or healthcare power of attorney to your next office visit.   Cheryl Cisneros , Thank you for taking time to come for your Medicare Wellness Visit. I appreciate your ongoing commitment to your health goals. Please review the following plan we discussed and let me know if I can assist you in the future.   These are the goals we discussed: Goals    . Maintain healthy lifestyle.       This is a list of the screening recommended for you and due dates:  Health Maintenance  Topic Date Due  . COVID-19 Vaccine (1) Never done  . Eye exam for diabetics  04/02/2019  . Complete foot exam   04/29/2019  . Hemoglobin A1C  09/12/2019  . Flu Shot  11/21/2019  . Mammogram  05/02/2020  . Colon Cancer Screening  08/08/2020  . DEXA scan (bone density measurement)  05/02/2021  . Tetanus Vaccine  06/29/2023  .  Hepatitis C: One time screening is recommended by Center for Disease Control  (CDC) for  adults born from [birth year] through 1967.   Completed  . Pneumonia vaccines  Completed    Preventive Care 76 Years and Older, Female Preventive care refers to lifestyle choices and visits with your health care provider that can promote health and wellness. This includes:  A yearly physical exam. This is also called an annual well check.  Regular dental and eye exams.  Immunizations.  Screening for certain conditions.  Healthy lifestyle choices, such as diet and exercise. What can I expect for my preventive care visit? Physical exam Your health care provider will check:  Height and weight. These may be  used to calculate body mass index (BMI), which is a measurement that tells if you are at a healthy weight.  Heart rate and blood pressure.  Your skin for abnormal spots. Counseling Your health care provider may ask you questions about:  Alcohol, tobacco, and drug use.  Emotional well-being.  Home and relationship well-being.  Sexual activity.  Eating habits.  History of falls.  Memory and ability to understand (cognition).  Work and work Statistician.  Pregnancy and menstrual history. What immunizations do I need?  Influenza (flu) vaccine  This is recommended every year. Tetanus, diphtheria, and pertussis (Tdap) vaccine  You may need a Td booster every 10 years. Varicella (chickenpox) vaccine  You may need this vaccine if you have not already been vaccinated. Zoster (shingles) vaccine  You may need this after age 45. Pneumococcal conjugate (PCV13) vaccine  One dose is recommended after age 81. Pneumococcal polysaccharide (PPSV23) vaccine  One dose is recommended after age 73. Measles, mumps, and rubella (MMR) vaccine  You may need at least one dose of MMR if you were born in 1957 or later. You may also need a second dose. Meningococcal conjugate (MenACWY) vaccine  You may need this if you have certain conditions. Hepatitis A vaccine  You may need this if you have certain conditions or if you travel or work in places where you may be exposed to hepatitis A. Hepatitis B vaccine  You may need this if you have certain  conditions or if you travel or work in places where you may be exposed to hepatitis B. Haemophilus influenzae type b (Hib) vaccine  You may need this if you have certain conditions. You may receive vaccines as individual doses or as more than one vaccine together in one shot (combination vaccines). Talk with your health care provider about the risks and benefits of combination vaccines. What tests do I need? Blood tests  Lipid and cholesterol  levels. These may be checked every 5 years, or more frequently depending on your overall health.  Hepatitis C test.  Hepatitis B test. Screening  Lung cancer screening. You may have this screening every year starting at age 49 if you have a 30-pack-year history of smoking and currently smoke or have quit within the past 15 years.  Colorectal cancer screening. All adults should have this screening starting at age 32 and continuing until age 57. Your health care provider may recommend screening at age 41 if you are at increased risk. You will have tests every 1-10 years, depending on your results and the type of screening test.  Diabetes screening. This is done by checking your blood sugar (glucose) after you have not eaten for a while (fasting). You may have this done every 1-3 years.  Mammogram. This may be done every 1-2 years. Talk with your health care provider about how often you should have regular mammograms.  BRCA-related cancer screening. This may be done if you have a family history of breast, ovarian, tubal, or peritoneal cancers. Other tests  Sexually transmitted disease (STD) testing.  Bone density scan. This is done to screen for osteoporosis. You may have this done starting at age 28. Follow these instructions at home: Eating and drinking  Eat a diet that includes fresh fruits and vegetables, whole grains, lean protein, and low-fat dairy products. Limit your intake of foods with high amounts of sugar, saturated fats, and salt.  Take vitamin and mineral supplements as recommended by your health care provider.  Do not drink alcohol if your health care provider tells you not to drink.  If you drink alcohol: ? Limit how much you have to 0-1 drink a day. ? Be aware of how much alcohol is in your drink. In the [place] one drink equals one 12 oz bottle of beer (355 mL), one 5 oz glass of wine (148 mL), or one 1 oz glass of hard liquor (44 mL). Lifestyle  Take daily care of  your teeth and gums.  Stay active. Exercise for at least 30 minutes on 5 or more days each week.  Do not use any products that contain nicotine or tobacco, such as cigarettes, e-cigarettes, and chewing tobacco. If you need help quitting, ask your health care provider.  If you are sexually active, practice safe sex. Use a condom or other form of protection in order to prevent STIs (sexually transmitted infections).  Talk with your health care provider about taking a low-dose aspirin or statin. What's next?  Go to your health care provider once a year for a well check visit.  Ask your health care provider how often you should have your eyes and teeth checked.  Stay up to date on all vaccines. This information is not intended to replace advice given to you by your health care provider. Make sure you discuss any questions you have with your health care provider. Document Revised: 04/02/2018 Document Reviewed: 04/02/2018 Elsevier Patient Education  2020 [employer].

## 2019-08-20 DIAGNOSIS — J3081 Allergic rhinitis due to animal (cat) (dog) hair and dander: Secondary | ICD-10-CM | POA: Diagnosis not present

## 2019-08-20 DIAGNOSIS — J3089 Other allergic rhinitis: Secondary | ICD-10-CM | POA: Diagnosis not present

## 2019-08-20 DIAGNOSIS — J301 Allergic rhinitis due to pollen: Secondary | ICD-10-CM | POA: Diagnosis not present

## 2019-09-13 ENCOUNTER — Encounter: Payer: Self-pay | Admitting: Family Medicine

## 2019-09-13 ENCOUNTER — Other Ambulatory Visit: Payer: Self-pay

## 2019-09-13 ENCOUNTER — Ambulatory Visit (INDEPENDENT_AMBULATORY_CARE_PROVIDER_SITE_OTHER): Payer: Medicare Other | Admitting: Family Medicine

## 2019-09-13 VITALS — BP 150/80 | HR 91 | Temp 97.9°F | Resp 18 | Ht 62.0 in | Wt 132.6 lb

## 2019-09-13 DIAGNOSIS — J301 Allergic rhinitis due to pollen: Secondary | ICD-10-CM | POA: Diagnosis not present

## 2019-09-13 DIAGNOSIS — E785 Hyperlipidemia, unspecified: Secondary | ICD-10-CM

## 2019-09-13 DIAGNOSIS — E1169 Type 2 diabetes mellitus with other specified complication: Secondary | ICD-10-CM | POA: Diagnosis not present

## 2019-09-13 DIAGNOSIS — Z23 Encounter for immunization: Secondary | ICD-10-CM | POA: Diagnosis not present

## 2019-09-13 DIAGNOSIS — E1165 Type 2 diabetes mellitus with hyperglycemia: Secondary | ICD-10-CM | POA: Diagnosis not present

## 2019-09-13 DIAGNOSIS — I10 Essential (primary) hypertension: Secondary | ICD-10-CM

## 2019-09-13 DIAGNOSIS — Z0000 Encounter for general adult medical examination without abnormal findings: Secondary | ICD-10-CM | POA: Insufficient documentation

## 2019-09-13 DIAGNOSIS — J3089 Other allergic rhinitis: Secondary | ICD-10-CM | POA: Diagnosis not present

## 2019-09-13 DIAGNOSIS — J3081 Allergic rhinitis due to animal (cat) (dog) hair and dander: Secondary | ICD-10-CM | POA: Diagnosis not present

## 2019-09-13 LAB — COMPREHENSIVE METABOLIC PANEL
ALT: 27 U/L (ref 0–35)
AST: 19 U/L (ref 0–37)
Albumin: 4.9 g/dL (ref 3.5–5.2)
Alkaline Phosphatase: 27 U/L — ABNORMAL LOW (ref 39–117)
BUN: 26 mg/dL — ABNORMAL HIGH (ref 6–23)
CO2: 29 mEq/L (ref 19–32)
Calcium: 10.2 mg/dL (ref 8.4–10.5)
Chloride: 99 mEq/L (ref 96–112)
Creatinine, Ser: 1.13 mg/dL (ref 0.40–1.20)
GFR: 46.82 mL/min — ABNORMAL LOW (ref >60.00)
Glucose, Bld: 152 mg/dL — ABNORMAL HIGH (ref 70–99)
Potassium: 4.5 mEq/L (ref 3.5–5.1)
Sodium: 140 mEq/L (ref 135–145)
Total Bilirubin: 0.4 mg/dL (ref 0.2–1.2)
Total Protein: 7.5 g/dL (ref 6.0–8.3)

## 2019-09-13 LAB — LIPID PANEL
Cholesterol: 131 mg/dL (ref 0–200)
HDL: 48.3 mg/dL (ref >39.00)
LDL Cholesterol: 52 mg/dL (ref 0–99)
NonHDL: 82.92
Total CHOL/HDL Ratio: 3
Triglycerides: 155 mg/dL — ABNORMAL HIGH (ref 0.0–149.0)
VLDL: 31 mg/dL (ref 0.0–40.0)

## 2019-09-13 LAB — MICROALBUMIN / CREATININE URINE RATIO
Creatinine,U: 18.4 mg/dL
Microalb Creat Ratio: 4.5 mg/g (ref 0.0–30.0)
Microalb, Ur: 0.8 mg/dL (ref 0.0–1.9)

## 2019-09-13 LAB — HEMOGLOBIN A1C: Hgb A1c MFr Bld: 6.6 % — ABNORMAL HIGH (ref 4.6–6.5)

## 2019-09-13 LAB — TSH: TSH: 1 u[IU]/mL (ref 0.35–4.50)

## 2019-09-13 MED ORDER — OLMESARTAN MEDOXOMIL-HCTZ 20-12.5 MG PO TABS: 1.0000 | ORAL_TABLET | Freq: Every day | ORAL | 1 refills | Status: DC

## 2019-09-13 NOTE — Progress Notes (Signed)
Subjective:     Cheryl Cisneros is a 74 y.o. female and is here for a comprehensive physical exam. The patient reports problems - hair falling out with inc dose benicar.  HYPERTENSION   Blood pressure range-borderline   Chest pain- no      Dyspnea- no Lightheadedness- no   Edema- no  Other side effects - no   Medication compliance: good Low salt diet- yes     DIABETES    Blood Sugar ranges-under 200 see home readings   Polyuria- no New Visual problems- no  Hypoglycemic symptoms- no  Other side effects-no Medication compliance - good Last eye exam- Jan 2021 Foot exam- todsay    HYPERLIPIDEMIA  Medication compliance- no RUQ pain- no  Muscle aches- no Other side effects-no      Social History   Socioeconomic History  . Marital status: Married    Spouse name: Not on file  . Number of children: Not on file  . Years of education: Not on file  . Highest education level: Not on file  Occupational History  . Occupation: housewife    Employer: UNEMPLOYED  Tobacco Use  . Smoking status: Never Smoker  . Smokeless tobacco: Never Used  Substance and Sexual Activity  . Alcohol use: No    Alcohol/week: 0.0 standard drinks  . Drug use: No  . Sexual activity: Not Currently    Partners: Male  Other Topics Concern  . Not on file  Social History Narrative   Exercise--  bootcamp 3 days a week and walking   Social Determinants of Health   Financial Resource Strain: Low Risk   . Difficulty of Paying Living Expenses: Not hard at all  Food Insecurity: No Food Insecurity  . Worried About Charity fundraiser in the Last Year: Never true  . Ran Out of Food in the Last Year: Never true  Transportation Needs: No Transportation Needs  . Lack of Transportation (Medical): No  . Lack of Transportation (Non-Medical): No  Physical Activity:   . Days of Exercise per Week:   . Minutes of Exercise per Session:   Stress:   . Feeling of Stress :   Social Connections:   . Frequency of  Communication with Friends and Family:   . Frequency of Social Gatherings with Friends and Family:   . Attends Religious Services:   . Active Member of Clubs or Organizations:   . Attends Archivist Meetings:   Marland Kitchen Marital Status:   Intimate Partner Violence:   . Fear of Current or Ex-Partner:   . Emotionally Abused:   Marland Kitchen Physically Abused:   . Sexually Abused:    Health Maintenance  Topic Date Due  . HEMOGLOBIN A1C  09/12/2019  . INFLUENZA VACCINE  11/21/2019  . OPHTHALMOLOGY EXAM  04/22/2020  . MAMMOGRAM  05/02/2020  . COLONOSCOPY  08/08/2020  . FOOT EXAM  09/12/2020  . DEXA SCAN  05/02/2021  . TETANUS/TDAP  06/29/2023  . COVID-19 Vaccine  Completed  . Hepatitis C Screening  Completed  . PNA vac Low Risk Adult  Completed    The following portions of the patient's history were reviewed and updated as appropriate: She  has a past medical history of Allergy, Arthritis, Asthma, Borderline glaucoma, Cataract, Diabetes ([place]), GERD (gastroesophageal reflux disease), Hypertension, Macular degeneration (12/25/2011), Osteopenia, and Thyroid disease. She does not have any pertinent problems on file. She  has a past surgical history that includes Tonsillectomy (1963); Knee arthroscopy (Right, 2002); Lumbar laminectomy (  1999); Rotator cuff repair (Right, 02/27/2005); Abdominal hysterectomy (1986); Back surgery (2016); and Eye surgery (Right, 07/28/2017). Her family history includes Cancer in her father; Cancer (age of onset: 60) in her daughter; Cervical cancer in her daughter; Coronary artery disease in an other family member; Dementia in her sister; Diabetes in her brother, father, mother, sister, and sister; Hyperlipidemia in her brother, sister, and sister; Hypertension in her brother, father, mother, sister, and sister; Kidney disease in her mother; Lung cancer in an other family member; Other in her sister. She  reports that she has never smoked. She has never used smokeless tobacco.  She reports that she does not drink alcohol or use drugs. She has a current medication list which includes the following prescription(s): alendronate, aspirin ec, biotin, calcium-vitamin d, vitamin d3, epipen 2-pak, eq allergy relief (cetirizine), garlic oil, onetouch ultra, l-lysine, onetouch delica plus [id number], levothyroxine, magnesium, metformin, nateglinide, olmesartan, omega-3 fatty acids, omeprazole, OVER THE COUNTER MEDICATION, pravastatin, saline, turmeric, vitamin c, olmesartan, and olmesartan-hydrochlorothiazide. Current Outpatient Medications on File Prior to Visit  Medication Sig Dispense Refill  . alendronate (FOSAMAX) 70 MG tablet TAKE ONE TABLET BY MOUTH ONCE A WEEK IN THE MORNING WITH A FULL GLASS OF WATER, 30 MINUTES BEFORE A MEAL OR BEVERAGE. REMAIN UPRIGHT 12 tablet 3  . aspirin EC 81 MG tablet Take 1 tablet (81 mg total) by mouth daily.    . Biotin 1000 MCG tablet Take 5,000 mcg by mouth 2 (two) times daily.     . Calcium-Vitamin D 600-200 MG-UNIT per tablet Take 1 tablet by mouth 3 (three) times daily with meals.      . Cholecalciferol (VITAMIN D3) 1000 UNITS CAPS Take 1 capsule by mouth daily.      Marland Kitchen EPIPEN 2-PAK 0.3 MG/0.3ML DEVI Reported on 07/26/2015    . EQ ALLERGY RELIEF, CETIRIZINE, 10 MG tablet Take 1 tablet by mouth once daily 90 tablet 0  . Garlic Oil (SUPER GARLIC) 1696 MG CAPS Take 1 capsule by mouth daily.     Marland Kitchen glucose blood (ONETOUCH ULTRA) test strip USE  STRIP TO CHECK GLUCOSE ONCE DAILY Dx code: E11.9 100 each 12  . L-Lysine 500 MG TABS Take 1 tablet by mouth daily.      . Lancets (ONETOUCH DELICA PLUS [id number]) MISC USE   TO CHECK GLUCOSE TWICE DAILY 200 each 0  . levothyroxine (SYNTHROID) 100 MCG tablet Take 1 tablet (100 mcg total) by mouth daily. 90 tablet 3  . Magnesium 250 MG TABS Take 1 tablet by mouth daily.     . metFORMIN (GLUCOPHAGE) 1000 MG tablet Take 1 tablet (1,000 mg total) by mouth 2 (two) times daily with a meal. 180 tablet 1  . nateglinide  (STARLIX) 60 MG tablet Take 1 tablet (60 mg total) by mouth 3 (three) times daily with meals. 270 tablet 1  . olmesartan (BENICAR) 20 MG tablet Take 1 tablet (20 mg total) by mouth daily. 90 tablet 3  . Omega-3 Fatty Acids (OMEGA 3 PO) Take 2 tablets by mouth daily.     Marland Kitchen omeprazole (PRILOSEC) 40 MG capsule Take 1 capsule by mouth once daily 90 capsule 1  . OVER THE COUNTER MEDICATION Maxi-vision 2 capsules per day    . pravastatin (PRAVACHOL) 10 MG tablet Take 1 tablet (10 mg total) by mouth daily. Needs ov 90 tablet 1  . SIMPLY SALINE NA Place 1 spray into the nose daily.      . Turmeric 500 MG CAPS Take 1 capsule  by mouth 3 (three) times daily.     . vitamin C (ASCORBIC ACID) 500 MG tablet Take 1,000 mg by mouth daily.    Marland Kitchen olmesartan (BENICAR) 40 MG tablet Take 1 tablet by mouth once daily (Patient not taking: Reported on 09/13/2019) 90 tablet 0   No current facility-administered medications on file prior to visit.   She is allergic to losartan potassium; metoprolol; and norvasc [amlodipine besylate]..  Review of Systems Review of Systems  Constitutional: Negative for activity change, appetite change and fatigue.  HENT: Negative for hearing loss, congestion, tinnitus and ear discharge.  dentist q61m Eyes: Negative for visual disturbance (see optho q1y -- vision corrected to 20/20 with glasses).  Respiratory: Negative for cough, chest tightness and shortness of breath.   Cardiovascular: Negative for chest pain, palpitations and leg swelling.  Gastrointestinal: Negative for abdominal pain, diarrhea, constipation and abdominal distention.  Genitourinary: Negative for urgency, frequency, decreased urine volume and difficulty urinating.  Musculoskeletal: Negative for back pain, arthralgias and gait problem.  Skin: Negative for color change, pallor and rash.  Neurological: Negative for dizziness, light-headedness, numbness and headaches.  Hematological: Negative for adenopathy. Does not  bruise/bleed easily.  Psychiatric/Behavioral: Negative for suicidal ideas, confusion, sleep disturbance, self-injury, dysphoric mood, decreased concentration and agitation.       Objective:    BP (!) 150/80 (BP Location: Left Arm, Patient Position: Sitting, Cuff Size: Normal)   Pulse 91   Temp 97.9 F (36.6 C) (Temporal)   Resp 18   Ht 5\' 2"  (1.575 m)   Wt 132 lb 9.6 oz (60.1 kg)   SpO2 99%   BMI 24.25 kg/m  General appearance: alert, cooperative, appears stated age and no distress Head: Normocephalic, without obvious abnormality, atraumatic Eyes: conjunctivae/corneas clear. PERRL, EOM's intact. Fundi benign. Ears: normal TM's and external ear canals both ears Nose: Nares normal. Septum midline. Mucosa normal. No drainage or sinus tenderness. Throat: lips, mucosa, and tongue normal; teeth and gums normal Neck: no adenopathy, no carotid bruit, no JVD, supple, symmetrical, trachea midline and thyroid not enlarged, symmetric, no tenderness/mass/nodules Back: symmetric, no curvature. ROM normal. No CVA tenderness. Lungs: clear to auscultation bilaterally Breasts: normal appearance, no masses or tenderness Heart: regular rate and rhythm, S1, S2 normal, no murmur, click, rub or gallop Abdomen: soft, non-tender; bowel sounds normal; no masses,  no organomegaly Pelvic: not indicated; status post hysterectomy, negative ROS Extremities: extremities normal, atraumatic, no cyanosis or edema Pulses: 2+ and symmetric Skin: Skin color, texture, turgor normal. No rashes or lesions Lymph nodes: Cervical, supraclavicular, and axillary nodes normal. Neurologic: Alert and oriented X 3, normal strength and tone. Normal symmetric reflexes. Normal coordination and gait    Diabetic Foot Exam - Simple   Simple Foot Form Diabetic Foot exam was performed with the following findings: Yes 09/13/2019  9:38 AM  Visual Inspection No deformities, no ulcerations, no other skin breakdown bilaterally:  Yes Sensation Testing Intact to touch and monofilament testing bilaterally: Yes Pulse Check Posterior Tibialis and Dorsalis pulse intact bilaterally: Yes Comments     Assessment:    Healthy female exam.      Plan:    ghm utd Check labs  See After Visit Summary for Counseling Recommendations    .1. Type 2 diabetes mellitus with hyperglycemia, without long-term current use of insulin (HCC) hgba1c to be checked , minimize simple carbs. Increase exercise as tolerated. Continue current meds - Pneumococcal polysaccharide vaccine 23-valent greater than or equal to 2yo subcutaneous/IM - Hemoglobin A1c -  Comprehensive metabolic panel - Microalbumin / creatinine urine ratio  2. Essential hypertension Poorly controlled will alter medications, encouraged DASH diet, minimize caffeine and obtain adequate sleep. Report concerning symptoms and follow up as directed and as needed - Lipid panel - Comprehensive metabolic panel - Microalbumin / creatinine urine ratio - olmesartan-hydrochlorothiazide (BENICAR HCT) 20-12.5 MG tablet; Take 1 tablet by mouth daily.  Dispense: 90 tablet; Refill: 1  3. Hyperlipidemia associated with type 2 diabetes mellitus ([place]) Tolerating statin, encouraged heart healthy diet, avoid trans fats, minimize simple carbs and saturated fats. Increase exercise as tolerated - TSH - Lipid panel - Hemoglobin A1c - Comprehensive metabolic panel  4. Preventative health care See above

## 2019-09-13 NOTE — Patient Instructions (Signed)
Preventive Care 76 Years and Older, Female Preventive care refers to lifestyle choices and visits with your health care provider that can promote health and wellness. This includes:  A yearly physical exam. This is also called an annual well check.  Regular dental and eye exams.  Immunizations.  Screening for certain conditions.  Healthy lifestyle choices, such as diet and exercise. What can I expect for my preventive care visit? Physical exam Your health care provider will check:  Height and weight. These may be used to calculate body mass index (BMI), which is a measurement that tells if you are at a healthy weight.  Heart rate and blood pressure.  Your skin for abnormal spots. Counseling Your health care provider may ask you questions about:  Alcohol, tobacco, and drug use.  Emotional well-being.  Home and relationship well-being.  Sexual activity.  Eating habits.  History of falls.  Memory and ability to understand (cognition).  Work and work Statistician.  Pregnancy and menstrual history. What immunizations do I need?  Influenza (flu) vaccine  This is recommended every year. Tetanus, diphtheria, and pertussis (Tdap) vaccine  You may need a Td booster every 10 years. Varicella (chickenpox) vaccine  You may need this vaccine if you have not already been vaccinated. Zoster (shingles) vaccine  You may need this after age 33. Pneumococcal conjugate (PCV13) vaccine  One dose is recommended after age 33. Pneumococcal polysaccharide (PPSV23) vaccine  One dose is recommended after age 72. Measles, mumps, and rubella (MMR) vaccine  You may need at least one dose of MMR if you were born in 1957 or later. You may also need a second dose. Meningococcal conjugate (MenACWY) vaccine  You may need this if you have certain conditions. Hepatitis A vaccine  You may need this if you have certain conditions or if you travel or work in places where you may be exposed  to hepatitis A. Hepatitis B vaccine  You may need this if you have certain conditions or if you travel or work in places where you may be exposed to hepatitis B. Haemophilus influenzae type b (Hib) vaccine  You may need this if you have certain conditions. You may receive vaccines as individual doses or as more than one vaccine together in one shot (combination vaccines). Talk with your health care provider about the risks and benefits of combination vaccines. What tests do I need? Blood tests  Lipid and cholesterol levels. These may be checked every 5 years, or more frequently depending on your overall health.  Hepatitis C test.  Hepatitis B test. Screening  Lung cancer screening. You may have this screening every year starting at age 39 if you have a 30-pack-year history of smoking and currently smoke or have quit within the past 15 years.  Colorectal cancer screening. All adults should have this screening starting at age 36 and continuing until age 15. Your health care provider may recommend screening at age 23 if you are at increased risk. You will have tests every 1-10 years, depending on your results and the type of screening test.  Diabetes screening. This is done by checking your blood sugar (glucose) after you have not eaten for a while (fasting). You may have this done every 1-3 years.  Mammogram. This may be done every 1-2 years. Talk with your health care provider about how often you should have regular mammograms.  BRCA-related cancer screening. This may be done if you have a family history of breast, ovarian, tubal, or peritoneal cancers.  Other tests  Sexually transmitted disease (STD) testing.  Bone density scan. This is done to screen for osteoporosis. You may have this done starting at age 44. Follow these instructions at home: Eating and drinking  Eat a diet that includes fresh fruits and vegetables, whole grains, lean protein, and low-fat dairy products. Limit  your intake of foods with high amounts of sugar, saturated fats, and salt.  Take vitamin and mineral supplements as recommended by your health care provider.  Do not drink alcohol if your health care provider tells you not to drink.  If you drink alcohol: ? Limit how much you have to 0-1 drink a day. ? Be aware of how much alcohol is in your drink. In the [place] one drink equals one 12 oz bottle of beer (355 mL), one 5 oz glass of wine (148 mL), or one 1 oz glass of hard liquor (44 mL). Lifestyle  Take daily care of your teeth and gums.  Stay active. Exercise for at least 30 minutes on 5 or more days each week.  Do not use any products that contain nicotine or tobacco, such as cigarettes, e-cigarettes, and chewing tobacco. If you need help quitting, ask your health care provider.  If you are sexually active, practice safe sex. Use a condom or other form of protection in order to prevent STIs (sexually transmitted infections).  Talk with your health care provider about taking a low-dose aspirin or statin. What's next?  Go to your health care provider once a year for a well check visit.  Ask your health care provider how often you should have your eyes and teeth checked.  Stay up to date on all vaccines. This information is not intended to replace advice given to you by your health care provider. Make sure you discuss any questions you have with your health care provider. Document Revised: 04/02/2018 Document Reviewed: 04/02/2018 Elsevier Patient Education  2020 [employer].

## 2019-09-27 DIAGNOSIS — J3089 Other allergic rhinitis: Secondary | ICD-10-CM | POA: Diagnosis not present

## 2019-09-27 DIAGNOSIS — J301 Allergic rhinitis due to pollen: Secondary | ICD-10-CM | POA: Diagnosis not present

## 2019-10-02 ENCOUNTER — Other Ambulatory Visit: Payer: Self-pay | Admitting: Family Medicine

## 2019-10-02 DIAGNOSIS — E785 Hyperlipidemia, unspecified: Secondary | ICD-10-CM

## 2019-10-02 DIAGNOSIS — K219 Gastro-esophageal reflux disease without esophagitis: Secondary | ICD-10-CM

## 2019-10-02 DIAGNOSIS — E1151 Type 2 diabetes mellitus with diabetic peripheral angiopathy without gangrene: Secondary | ICD-10-CM

## 2019-10-20 DIAGNOSIS — J301 Allergic rhinitis due to pollen: Secondary | ICD-10-CM | POA: Diagnosis not present

## 2019-10-20 DIAGNOSIS — J3081 Allergic rhinitis due to animal (cat) (dog) hair and dander: Secondary | ICD-10-CM | POA: Diagnosis not present

## 2019-10-20 DIAGNOSIS — J3089 Other allergic rhinitis: Secondary | ICD-10-CM | POA: Diagnosis not present

## 2019-10-21 DIAGNOSIS — J3089 Other allergic rhinitis: Secondary | ICD-10-CM | POA: Diagnosis not present

## 2019-10-21 DIAGNOSIS — J3081 Allergic rhinitis due to animal (cat) (dog) hair and dander: Secondary | ICD-10-CM | POA: Diagnosis not present

## 2019-10-22 ENCOUNTER — Other Ambulatory Visit: Payer: Self-pay | Admitting: Family Medicine

## 2019-10-22 DIAGNOSIS — E1139 Type 2 diabetes mellitus with other diabetic ophthalmic complication: Secondary | ICD-10-CM

## 2019-10-26 ENCOUNTER — Other Ambulatory Visit: Payer: Self-pay | Admitting: Family Medicine

## 2019-10-26 DIAGNOSIS — J3081 Allergic rhinitis due to animal (cat) (dog) hair and dander: Secondary | ICD-10-CM | POA: Diagnosis not present

## 2019-10-26 DIAGNOSIS — J301 Allergic rhinitis due to pollen: Secondary | ICD-10-CM | POA: Diagnosis not present

## 2019-10-26 DIAGNOSIS — J3089 Other allergic rhinitis: Secondary | ICD-10-CM | POA: Diagnosis not present

## 2019-10-29 DIAGNOSIS — J301 Allergic rhinitis due to pollen: Secondary | ICD-10-CM | POA: Diagnosis not present

## 2019-10-29 DIAGNOSIS — J3081 Allergic rhinitis due to animal (cat) (dog) hair and dander: Secondary | ICD-10-CM | POA: Diagnosis not present

## 2019-10-29 DIAGNOSIS — J3089 Other allergic rhinitis: Secondary | ICD-10-CM | POA: Diagnosis not present

## 2019-11-01 DIAGNOSIS — J3089 Other allergic rhinitis: Secondary | ICD-10-CM | POA: Diagnosis not present

## 2019-11-01 DIAGNOSIS — J301 Allergic rhinitis due to pollen: Secondary | ICD-10-CM | POA: Diagnosis not present

## 2019-11-01 DIAGNOSIS — J3081 Allergic rhinitis due to animal (cat) (dog) hair and dander: Secondary | ICD-10-CM | POA: Diagnosis not present

## 2019-11-05 DIAGNOSIS — J301 Allergic rhinitis due to pollen: Secondary | ICD-10-CM | POA: Diagnosis not present

## 2019-11-05 DIAGNOSIS — J3089 Other allergic rhinitis: Secondary | ICD-10-CM | POA: Diagnosis not present

## 2019-11-05 DIAGNOSIS — J3081 Allergic rhinitis due to animal (cat) (dog) hair and dander: Secondary | ICD-10-CM | POA: Diagnosis not present

## 2019-11-08 DIAGNOSIS — J3081 Allergic rhinitis due to animal (cat) (dog) hair and dander: Secondary | ICD-10-CM | POA: Diagnosis not present

## 2019-11-08 DIAGNOSIS — J301 Allergic rhinitis due to pollen: Secondary | ICD-10-CM | POA: Diagnosis not present

## 2019-11-08 DIAGNOSIS — J3089 Other allergic rhinitis: Secondary | ICD-10-CM | POA: Diagnosis not present

## 2019-11-11 DIAGNOSIS — J301 Allergic rhinitis due to pollen: Secondary | ICD-10-CM | POA: Diagnosis not present

## 2019-11-11 DIAGNOSIS — J3081 Allergic rhinitis due to animal (cat) (dog) hair and dander: Secondary | ICD-10-CM | POA: Diagnosis not present

## 2019-11-11 DIAGNOSIS — J3089 Other allergic rhinitis: Secondary | ICD-10-CM | POA: Diagnosis not present

## 2019-12-14 DIAGNOSIS — J301 Allergic rhinitis due to pollen: Secondary | ICD-10-CM | POA: Diagnosis not present

## 2019-12-14 DIAGNOSIS — J3081 Allergic rhinitis due to animal (cat) (dog) hair and dander: Secondary | ICD-10-CM | POA: Diagnosis not present

## 2019-12-14 DIAGNOSIS — J3089 Other allergic rhinitis: Secondary | ICD-10-CM | POA: Diagnosis not present

## 2020-01-13 DIAGNOSIS — J3081 Allergic rhinitis due to animal (cat) (dog) hair and dander: Secondary | ICD-10-CM | POA: Diagnosis not present

## 2020-01-13 DIAGNOSIS — J301 Allergic rhinitis due to pollen: Secondary | ICD-10-CM | POA: Diagnosis not present

## 2020-01-13 DIAGNOSIS — J3089 Other allergic rhinitis: Secondary | ICD-10-CM | POA: Diagnosis not present

## 2020-01-18 ENCOUNTER — Other Ambulatory Visit: Payer: Self-pay | Admitting: Family Medicine

## 2020-01-18 DIAGNOSIS — E1139 Type 2 diabetes mellitus with other diabetic ophthalmic complication: Secondary | ICD-10-CM

## 2020-02-04 ENCOUNTER — Other Ambulatory Visit: Payer: Self-pay | Admitting: Family Medicine

## 2020-02-04 DIAGNOSIS — E1139 Type 2 diabetes mellitus with other diabetic ophthalmic complication: Secondary | ICD-10-CM

## 2020-02-14 DIAGNOSIS — J3089 Other allergic rhinitis: Secondary | ICD-10-CM | POA: Diagnosis not present

## 2020-02-14 DIAGNOSIS — J3081 Allergic rhinitis due to animal (cat) (dog) hair and dander: Secondary | ICD-10-CM | POA: Diagnosis not present

## 2020-02-14 DIAGNOSIS — J301 Allergic rhinitis due to pollen: Secondary | ICD-10-CM | POA: Diagnosis not present

## 2020-02-23 DIAGNOSIS — J301 Allergic rhinitis due to pollen: Secondary | ICD-10-CM | POA: Diagnosis not present

## 2020-02-23 DIAGNOSIS — J3089 Other allergic rhinitis: Secondary | ICD-10-CM | POA: Diagnosis not present

## 2020-02-23 DIAGNOSIS — J4520 Mild intermittent asthma, uncomplicated: Secondary | ICD-10-CM | POA: Diagnosis not present

## 2020-02-23 DIAGNOSIS — J3081 Allergic rhinitis due to animal (cat) (dog) hair and dander: Secondary | ICD-10-CM | POA: Diagnosis not present

## 2020-03-07 ENCOUNTER — Other Ambulatory Visit: Payer: Self-pay | Admitting: Family Medicine

## 2020-03-07 DIAGNOSIS — I10 Essential (primary) hypertension: Secondary | ICD-10-CM

## 2020-03-20 ENCOUNTER — Other Ambulatory Visit: Payer: Self-pay

## 2020-03-20 ENCOUNTER — Ambulatory Visit (INDEPENDENT_AMBULATORY_CARE_PROVIDER_SITE_OTHER): Payer: Medicare Other | Admitting: Family Medicine

## 2020-03-20 ENCOUNTER — Encounter: Payer: Self-pay | Admitting: Family Medicine

## 2020-03-20 VITALS — BP 138/86 | HR 88 | Temp 98.3°F | Resp 18 | Ht 62.0 in | Wt 132.8 lb

## 2020-03-20 DIAGNOSIS — E1165 Type 2 diabetes mellitus with hyperglycemia: Secondary | ICD-10-CM

## 2020-03-20 DIAGNOSIS — E785 Hyperlipidemia, unspecified: Secondary | ICD-10-CM

## 2020-03-20 DIAGNOSIS — IMO0002 Reserved for concepts with insufficient information to code with codable children: Secondary | ICD-10-CM

## 2020-03-20 DIAGNOSIS — J3089 Other allergic rhinitis: Secondary | ICD-10-CM | POA: Diagnosis not present

## 2020-03-20 DIAGNOSIS — E1169 Type 2 diabetes mellitus with other specified complication: Secondary | ICD-10-CM

## 2020-03-20 DIAGNOSIS — J3081 Allergic rhinitis due to animal (cat) (dog) hair and dander: Secondary | ICD-10-CM | POA: Diagnosis not present

## 2020-03-20 DIAGNOSIS — M8589 Other specified disorders of bone density and structure, multiple sites: Secondary | ICD-10-CM

## 2020-03-20 DIAGNOSIS — E1151 Type 2 diabetes mellitus with diabetic peripheral angiopathy without gangrene: Secondary | ICD-10-CM

## 2020-03-20 DIAGNOSIS — I10 Essential (primary) hypertension: Secondary | ICD-10-CM

## 2020-03-20 DIAGNOSIS — J301 Allergic rhinitis due to pollen: Secondary | ICD-10-CM | POA: Diagnosis not present

## 2020-03-20 DIAGNOSIS — L659 Nonscarring hair loss, unspecified: Secondary | ICD-10-CM | POA: Diagnosis not present

## 2020-03-20 DIAGNOSIS — K219 Gastro-esophageal reflux disease without esophagitis: Secondary | ICD-10-CM | POA: Diagnosis not present

## 2020-03-20 LAB — CBC WITH DIFFERENTIAL/PLATELET
Basophils Absolute: 0.1 10*3/uL (ref 0.0–0.1)
Basophils Relative: 1.1 % (ref 0.0–3.0)
Eosinophils Absolute: 0.1 10*3/uL (ref 0.0–0.7)
Eosinophils Relative: 1.8 % (ref 0.0–5.0)
HCT: 34.9 % — ABNORMAL LOW (ref 36.0–46.0)
Hemoglobin: 12 g/dL (ref 12.0–15.0)
Lymphocytes Relative: 44.7 % (ref 12.0–46.0)
Lymphs Abs: 2.7 10*3/uL (ref 0.7–4.0)
MCHC: 34.5 g/dL (ref 30.0–36.0)
MCV: 96 fl (ref 78.0–100.0)
Monocytes Absolute: 0.3 10*3/uL (ref 0.1–1.0)
Monocytes Relative: 5.2 % (ref 3.0–12.0)
Neutro Abs: 2.8 10*3/uL (ref 1.4–7.7)
Neutrophils Relative %: 47.2 % (ref 43.0–77.0)
Platelets: 194 10*3/uL (ref 150.0–400.0)
RBC: 3.64 Mil/uL — ABNORMAL LOW (ref 3.87–5.11)
RDW: 13.1 % (ref 11.5–15.5)
WBC: 6 10*3/uL (ref 4.0–10.5)

## 2020-03-20 LAB — COMPREHENSIVE METABOLIC PANEL
ALT: 23 U/L (ref 0–35)
AST: 18 U/L (ref 0–37)
Albumin: 4.7 g/dL (ref 3.5–5.2)
Alkaline Phosphatase: 23 U/L — ABNORMAL LOW (ref 39–117)
BUN: 30 mg/dL — ABNORMAL HIGH (ref 6–23)
CO2: 27 mEq/L (ref 19–32)
Calcium: 9.5 mg/dL (ref 8.4–10.5)
Chloride: 100 mEq/L (ref 96–112)
Creatinine, Ser: 1.3 mg/dL — ABNORMAL HIGH (ref 0.40–1.20)
GFR: 39.96 mL/min — ABNORMAL LOW (ref >60.00)
Glucose, Bld: 137 mg/dL — ABNORMAL HIGH (ref 70–99)
Potassium: 4 mEq/L (ref 3.5–5.1)
Sodium: 139 mEq/L (ref 135–145)
Total Bilirubin: 0.5 mg/dL (ref 0.2–1.2)
Total Protein: 7.4 g/dL (ref 6.0–8.3)

## 2020-03-20 LAB — VITAMIN B12: Vitamin B-12: >1526 pg/mL — ABNORMAL HIGH (ref 211–911)

## 2020-03-20 LAB — LIPID PANEL
Cholesterol: 121 mg/dL (ref 0–200)
HDL: 44.7 mg/dL (ref >39.00)
LDL Cholesterol: 51 mg/dL (ref 0–99)
NonHDL: 76.14
Total CHOL/HDL Ratio: 3
Triglycerides: 128 mg/dL (ref 0.0–149.0)
VLDL: 25.6 mg/dL (ref 0.0–40.0)

## 2020-03-20 LAB — HEMOGLOBIN A1C: Hgb A1c MFr Bld: 6.5 % (ref 4.6–6.5)

## 2020-03-20 MED ORDER — METFORMIN HCL 1000 MG PO TABS: ORAL_TABLET | ORAL | 1 refills | Status: DC

## 2020-03-20 MED ORDER — NONFORMULARY OR COMPOUNDED ITEM: 5 refills | Status: AC

## 2020-03-20 MED ORDER — ALENDRONATE SODIUM 70 MG PO TABS: ORAL_TABLET | ORAL | 3 refills | Status: DC

## 2020-03-20 MED ORDER — PRAVASTATIN SODIUM 10 MG PO TABS: 10.0000 mg | ORAL_TABLET | Freq: Every day | ORAL | 1 refills | Status: DC

## 2020-03-20 MED ORDER — OMEPRAZOLE 40 MG PO CPDR: 40.0000 mg | DELAYED_RELEASE_CAPSULE | Freq: Every day | ORAL | 3 refills | Status: DC

## 2020-03-20 NOTE — Assessment & Plan Note (Signed)
hgba1c to be checked, minimize simple carbs. Increase exercise as tolerated. Continue current meds  

## 2020-03-20 NOTE — Assessment & Plan Note (Signed)
Tolerating statin, encouraged heart healthy diet, avoid trans fats, minimize simple carbs and saturated fats. Increase exercise as tolerated 

## 2020-03-20 NOTE — Assessment & Plan Note (Signed)
Well controlled, no changes to meds. Encouraged heart healthy diet such as the DASH diet and exercise as tolerated.  °

## 2020-03-20 NOTE — Patient Instructions (Signed)
Carbohydrate Counting for Diabetes Mellitus, Adult  Carbohydrate counting is a method of keeping track of how many carbohydrates you eat. Eating carbohydrates naturally increases the amount of sugar (glucose) in the blood. Counting how many carbohydrates you eat helps keep your blood glucose within normal limits, which helps you manage your diabetes (diabetes mellitus). It is important to know how many carbohydrates you can safely have in each meal. This is different for every person. A diet and nutrition specialist (registered dietitian) can help you make a meal plan and calculate how many carbohydrates you should have at each meal and snack. Carbohydrates are found in the following foods:  Grains, such as breads and cereals.  Dried beans and soy products.  Starchy vegetables, such as potatoes, peas, and corn.  Fruit and fruit juices.  Milk and yogurt.  Sweets and snack foods, such as cake, cookies, candy, chips, and soft drinks. How do I count carbohydrates? There are two ways to count carbohydrates in food. You can use either of the methods or a combination of both. Reading "Nutrition Facts" on packaged food The "Nutrition Facts" list is included on the labels of almost all packaged foods and beverages in the [place] It includes:  The serving size.  Information about nutrients in each serving, including the grams (g) of carbohydrate per serving. To use the "Nutrition Facts":  Decide how many servings you will have.  Multiply the number of servings by the number of carbohydrates per serving.  The resulting number is the total amount of carbohydrates that you will be having. Learning standard serving sizes of other foods When you eat carbohydrate foods that are not packaged or do not include "Nutrition Facts" on the label, you need to measure the servings in order to count the amount of carbohydrates:  Measure the foods that you will eat with a food scale or measuring cup, if  needed.  Decide how many standard-size servings you will eat.  Multiply the number of servings by 15. Most carbohydrate-rich foods have about 15 g of carbohydrates per serving. ? For example, if you eat 8 oz (170 g) of strawberries, you will have eaten 2 servings and 30 g of carbohydrates (2 servings x 15 g = 30 g).  For foods that have more than one food mixed, such as soups and casseroles, you must count the carbohydrates in each food that is included. The following list contains standard serving sizes of common carbohydrate-rich foods. Each of these servings has about 15 g of carbohydrates:   hamburger bun or  English muffin.   oz (15 mL) syrup.   oz (14 g) jelly.  1 slice of bread.  1 six-inch tortilla.  3 oz (85 g) cooked rice or pasta.  4 oz (113 g) cooked dried beans.  4 oz (113 g) starchy vegetable, such as peas, corn, or potatoes.  4 oz (113 g) hot cereal.  4 oz (113 g) mashed potatoes or  of a large baked potato.  4 oz (113 g) canned or frozen fruit.  4 oz (120 mL) fruit juice.  4-6 crackers.  6 chicken nuggets.  6 oz (170 g) unsweetened dry cereal.  6 oz (170 g) plain fat-free yogurt or yogurt sweetened with artificial sweeteners.  8 oz (240 mL) milk.  8 oz (170 g) fresh fruit or one small piece of fruit.  24 oz (680 g) popped popcorn. Example of carbohydrate counting Sample meal  3 oz (85 g) chicken breast.  6 oz (170 g)   brown rice.  4 oz (113 g) corn.  8 oz (240 mL) milk.  8 oz (170 g) strawberries with sugar-free whipped topping. Carbohydrate calculation 1. Identify the foods that contain carbohydrates: ? Rice. ? Corn. ? Milk. ? Strawberries. 2. Calculate how many servings you have of each food: ? 2 servings rice. ? 1 serving corn. ? 1 serving milk. ? 1 serving strawberries. 3. Multiply each number of servings by 15 g: ? 2 servings rice x 15 g = 30 g. ? 1 serving corn x 15 g = 15 g. ? 1 serving milk x 15 g = 15 g. ? 1  serving strawberries x 15 g = 15 g. 4. Add together all of the amounts to find the total grams of carbohydrates eaten: ? 30 g + 15 g + 15 g + 15 g = 75 g of carbohydrates total. Summary  Carbohydrate counting is a method of keeping track of how many carbohydrates you eat.  Eating carbohydrates naturally increases the amount of sugar (glucose) in the blood.  Counting how many carbohydrates you eat helps keep your blood glucose within normal limits, which helps you manage your diabetes.  A diet and nutrition specialist (registered dietitian) can help you make a meal plan and calculate how many carbohydrates you should have at each meal and snack. This information is not intended to replace advice given to you by your health care provider. Make sure you discuss any questions you have with your health care provider. Document Revised: 10/31/2016 Document Reviewed: 09/20/2015 Elsevier Patient Education  2020 [employer]  

## 2020-03-20 NOTE — Progress Notes (Signed)
Patient ID: Cheryl Cisneros, female    DOB: [birth year]  Age: 74 y.o. MRN: [id number]    Subjective:  Subjective  HPI Cheryl Cisneros presents for f/u dm, bp and chol  HYPERTENSION   Blood pressure range-good-- see home readings   Chest pain- no      Dyspnea- no Lightheadedness- no   Edema- no  Other side effects - no   Medication compliance: good Low salt diet- yes    DIABETES    Blood Sugar ranges-140-190s  Polyuria- no New Visual problems- no  Hypoglycemic symptoms- no  Other side effects-no Medication compliance - goo Last eye exam- in 2 weeks  Foot exam- today   HYPERLIPIDEMIA  Medication compliance- good RUQ pain- no  Muscle aches- no Other side effects-no      Review of Systems  Constitutional: Negative for appetite change, diaphoresis, fatigue and unexpected weight change.  Eyes: Negative for pain, redness and visual disturbance.  Respiratory: Negative for cough, chest tightness, shortness of breath and wheezing.   Cardiovascular: Negative for chest pain, palpitations and leg swelling.  Endocrine: Negative for cold intolerance, heat intolerance, polydipsia, polyphagia and polyuria.  Genitourinary: Negative for difficulty urinating, dysuria and frequency.  Neurological: Negative for dizziness, light-headedness, numbness and headaches.   Diabetic Foot Exam - Simple   Simple Foot Form Diabetic Foot exam was performed with the following findings: Yes 03/20/2020 10:03 AM  Visual Inspection No deformities, no ulcerations, no other skin breakdown bilaterally: Yes Sensation Testing Intact to touch and monofilament testing bilaterally: Yes Pulse Check Posterior Tibialis and Dorsalis pulse intact bilaterally: Yes Comments      History Past Medical History:  Diagnosis Date  . Allergy   . Arthritis   . Asthma   . Borderline glaucoma   . Cataract   . Diabetes ([place])   . GERD (gastroesophageal reflux disease)   . Hypertension   . Macular degeneration  12/25/2011  . Osteopenia   . Thyroid disease    Hypothyroidism    She has a past surgical history that includes Tonsillectomy (1963); Knee arthroscopy (Right, 2002); Lumbar laminectomy (1999); Rotator cuff repair (Right, 02/27/2005); Abdominal hysterectomy (1986); Back surgery (2016); and Eye surgery (Right, 07/28/2017).   Her family history includes Cancer in her father; Cancer (age of onset: 1) in her daughter; Cervical cancer in her daughter; Coronary artery disease in an other family member; Dementia in her sister; Diabetes in her brother, father, mother, sister, and sister; Hyperlipidemia in her brother, sister, and sister; Hypertension in her brother, father, mother, sister, and sister; Kidney disease in her mother; Lung cancer in an other family member; Other in her sister.She reports that she has never smoked. She has never used smokeless tobacco. She reports that she does not drink alcohol and does not use drugs.  Current Outpatient Medications on File Prior to Visit  Medication Sig Dispense Refill  . aspirin EC 81 MG tablet Take 1 tablet (81 mg total) by mouth daily.    . Biotin 1000 MCG tablet Take 5,000 mcg by mouth 2 (two) times daily.     . Calcium-Vitamin D 600-200 MG-UNIT per tablet Take 1 tablet by mouth 3 (three) times daily with meals.      . Cholecalciferol (VITAMIN D3) 1000 UNITS CAPS Take 1 capsule by mouth daily.      Marland Kitchen EPIPEN 2-PAK 0.3 MG/0.3ML DEVI Reported on 07/26/2015    . EQ ALLERGY RELIEF, CETIRIZINE, 10 MG tablet Take 1 tablet by mouth once daily 90 tablet  1  . Garlic Oil (SUPER GARLIC) 2725 MG CAPS Take 1 capsule by mouth daily.     Marland Kitchen glucose blood (ONETOUCH ULTRA) test strip USE  STRIP TO CHECK GLUCOSE ONCE DAILY Dx code: E11.9 100 each 12  . L-Lysine 500 MG TABS Take 1 tablet by mouth daily.      . Lancets (ONETOUCH DELICA PLUS [id number]) MISC USE   TO CHECK GLUCOSE TWICE DAILY 200 each 0  . levothyroxine (SYNTHROID) 100 MCG tablet Take 1 tablet (100 mcg total) by  mouth daily. 90 tablet 3  . Magnesium 250 MG TABS Take 1 tablet by mouth daily.     . nateglinide (STARLIX) 60 MG tablet TAKE 1 TABLET BY MOUTH THREE TIMES DAILY WITH MEALS 270 tablet 0  . olmesartan-hydrochlorothiazide (BENICAR HCT) 20-12.5 MG tablet Take 1 tablet by mouth daily. 90 tablet 0  . Omega-3 Fatty Acids (OMEGA 3 PO) Take 2 tablets by mouth daily.     Marland Kitchen OVER THE COUNTER MEDICATION Maxi-vision 2 capsules per day    . SIMPLY SALINE NA Place 1 spray into the nose daily.      Marland Kitchen Specialty Vitamins Products (HAIR NOURISHING SUPPLEMENT PO) Take by mouth. Per patient twice a day after meals.    . Turmeric 500 MG CAPS Take 1 capsule by mouth 3 (three) times daily.     . vitamin C (ASCORBIC ACID) 500 MG tablet Take 1,000 mg by mouth daily.     No current facility-administered medications on file prior to visit.     Objective:  Objective  Physical Exam Constitutional:      Appearance: She is well-developed.  HENT:     Head: Normocephalic and atraumatic.     Right Ear: External ear normal.     Left Ear: External ear normal.  Eyes:     General:        Right eye: No discharge.        Left eye: No discharge.     Conjunctiva/sclera: Conjunctivae normal.  Neck:     Thyroid: No thyromegaly.     Vascular: No carotid bruit or JVD.  Cardiovascular:     Rate and Rhythm: Normal rate and regular rhythm.     Heart sounds: Normal heart sounds. No murmur heard.   Pulmonary:     Effort: Pulmonary effort is normal. No respiratory distress.     Breath sounds: Normal breath sounds. No wheezing or rales.  Chest:     Chest wall: No tenderness.  Musculoskeletal:     Cervical back: Normal range of motion and neck supple.  Lymphadenopathy:     Cervical: No cervical adenopathy.  Neurological:     Mental Status: She is alert and oriented to person, place, and time.    Diabetic Foot Exam - Simple   Simple Foot Form Diabetic Foot exam was performed with the following findings: Yes 03/20/2020  10:03 AM  Visual Inspection No deformities, no ulcerations, no other skin breakdown bilaterally: Yes Sensation Testing Intact to touch and monofilament testing bilaterally: Yes Pulse Check Posterior Tibialis and Dorsalis pulse intact bilaterally: Yes Comments     BP 138/86 (BP Location: Left Arm, Patient Position: Sitting, Cuff Size: Normal)   Pulse 88   Temp 98.3 F (36.8 C) (Oral)   Resp 18   Ht 5\' 2"  (1.575 m)   Wt 132 lb 12.8 oz (60.2 kg)   SpO2 98%   BMI 24.29 kg/m  Wt Readings from Last 3 Encounters:  03/20/20 132 lb  12.8 oz (60.2 kg)  09/13/19 132 lb 9.6 oz (60.1 kg)  08/19/19 130 lb 6.4 oz (59.1 kg)     Lab Results  Component Value Date   WBC 6.0 03/20/2020   HGB 12.0 03/20/2020   HCT 34.9 (L) 03/20/2020   PLT 194.0 03/20/2020   GLUCOSE 137 (H) 03/20/2020   CHOL 121 03/20/2020   TRIG 128.0 03/20/2020   HDL 44.70 03/20/2020   LDLCALC 51 03/20/2020   ALT 23 03/20/2020   AST 18 03/20/2020   NA 139 03/20/2020   K 4.0 03/20/2020   CL 100 03/20/2020   CREATININE 1.30 (H) 03/20/2020   BUN 30 (H) 03/20/2020   CO2 27 03/20/2020   TSH 1.00 09/13/2019   HGBA1C 6.5 03/20/2020   MICROALBUR 0.8 09/13/2019    MR Lumbar Spine Wo Contrast  Result Date: 12/02/2015 CLINICAL DATA:  Initial evaluation for two-month history of low back pain, radiating into left lower extremity. Worse with sitting. History of prior surgery. EXAM: MRI LUMBAR SPINE WITHOUT CONTRAST TECHNIQUE: Multiplanar, multisequence MR imaging of the lumbar spine was performed. No intravenous contrast was administered. COMPARISON:  Prior MRI from 07/23/2014. FINDINGS: Segmentation: Normal segmentation. Lowest well-formed this is presumed to be the L5-S1 level. Alignment: Stable alignment with preservation of the normal lumbar lordosis. No listhesis or subluxation. Vertebrae: Vertebral body heights are maintained. No acute or chronic fracture. Signal intensity within the vertebral body bone marrow is normal.  Benign hemangioma noted within the L4 vertebral body. No marrow edema. Conus medullaris: Extends to the L1 level and appears normal. Paraspinal and other soft tissues: Paraspinous soft tissues demonstrate no acute abnormality. Visualized visceral structures within normal limits. No retroperitoneal adenopathy. Intra-abdominal aorta of normal caliber. Disc levels: No significant degenerative changes are seen from the T11-12 through the L2-3 levels. L3-4: Diffuse disc bulge with disc desiccation. Superimposed shallow right foraminal disc protrusion abutting the exiting left L3 nerve root as it courses out of the left neural foramen (series 5, image 17). Overall, this is similar to previous study. Superimposed mild facet and ligamentum flavum hypertrophy. No significant canal or foraminal encroachment. L4-5: Interval performance of a decompressive left hemi laminectomy has been performed at this level since the previous exam. Persistent disc bulge with disc desiccation and intervertebral disc space narrowing. There is a persistent left subarticular disc protrusion extending into the left lateral recess, potentially affecting the left L5 nerve root. This appears slightly more prominent as compared to prior study, and is suspicious for possible recurrent disc herniation. Disc bulge again extends into the left neural foramen without frank impingement of the left L4 nerve root. Superimposed facet arthrosis with mild ligamentum flavum hypertrophy. No significant canal stenosis. Minimal right foraminal encroachment related to disc bulge and facet disease. L5-S1: Remote left laminectomy defect again noted. Degenerative disc desiccation with intervertebral disc space narrowing and diffuse disc bulge. Superimposed osteophytic endplate spurring. Mild left-sided facet arthrosis. Previously noted fibrosis surrounding the S1 nerve root not well appreciated on this noncontrast examination, but is likely unchanged. No significant  canal stenosis. Mild left foraminal stenosis, stable. IMPRESSION: 1. Left subarticular disc protrusion, slightly more prominent as compared to preoperative MRI from 2016, and suspicious for possible recurrent disc herniation. This potentially affects the transiting left L5 nerve root in the left lateral recess. Postoperative sequela from interval decompressive left hemi laminectomy at this level. 2. Stable small left extra foraminal disc protrusion, contacting the exiting left L3 nerve root. 3. Stable postsurgical changes on the left at  L5-S1 without new or recurrent stenosis. Electronically Signed   By: Jeannine Boga M.D.   On: 12/02/2015 22:18     Assessment & Plan:  Plan  I have changed Lam L. Litzinger's omeprazole. I am also having her start on NONFORMULARY OR COMPOUNDED ITEM. Additionally, I am having her maintain her Calcium-Vitamin D, L-Lysine, Magnesium, Omega-3 Fatty Acids (OMEGA 3 PO), Garlic Oil, Vitamin D3, SIMPLY SALINE NA, EpiPen 2-Pak, Biotin, OVER THE COUNTER MEDICATION, Turmeric, vitamin C, aspirin EC, levothyroxine, OneTouch Ultra, EQ Allergy Relief (Cetirizine), nateglinide, OneTouch Delica Plus [id number], olmesartan-hydrochlorothiazide, Specialty Vitamins Products (HAIR NOURISHING SUPPLEMENT PO), pravastatin, metFORMIN, and alendronate.  Meds ordered this encounter  Medications  . pravastatin (PRAVACHOL) 10 MG tablet    Sig: Take 1 tablet (10 mg total) by mouth daily.    Dispense:  90 tablet    Refill:  1  . omeprazole (PRILOSEC) 40 MG capsule    Sig: Take 1 capsule (40 mg total) by mouth daily.    Dispense:  90 capsule    Refill:  3  . metFORMIN (GLUCOPHAGE) 1000 MG tablet    Sig: Take 1 tablet by mouth twice daily with meals    Dispense:  180 tablet    Refill:  1  . alendronate (FOSAMAX) 70 MG tablet    Sig: TAKE ONE TABLET BY MOUTH ONCE A WEEK IN THE MORNING WITH A FULL GLASS OF WATER, 30 MINUTES BEFORE A MEAL OR BEVERAGE. REMAIN UPRIGHT    Dispense:  12 tablet     Refill:  3    Please consider 90 day supplies to promote better adherence  . NONFORMULARY OR COMPOUNDED ITEM    Sig: [facility] advanced hair [facility]  1 po bid    Dispense:  30 each    Refill:  5    Problem List Items Addressed This Visit      Unprioritized   DM (diabetes mellitus) type II uncontrolled, periph vascular disorder ([place])    hgba1c to be checked , minimize simple carbs. Increase exercise as tolerated. Continue current meds       Relevant Medications   pravastatin (PRAVACHOL) 10 MG tablet   metFORMIN (GLUCOPHAGE) 1000 MG tablet   Essential hypertension    Well controlled, no changes to meds. Encouraged heart healthy diet such as the DASH diet and exercise as tolerated.       Relevant Medications   pravastatin (PRAVACHOL) 10 MG tablet   Hair loss - Primary   Relevant Medications   NONFORMULARY OR COMPOUNDED ITEM   Other Relevant Orders   Comprehensive metabolic panel (Completed)   CBC with Differential/Platelet (Completed)   Vitamin B12 (Completed)   Hyperlipidemia associated with type 2 diabetes mellitus ([place])    Tolerating statin, encouraged heart healthy diet, avoid trans fats, minimize simple carbs and saturated fats. Increase exercise as tolerated      Relevant Medications   pravastatin (PRAVACHOL) 10 MG tablet   metFORMIN (GLUCOPHAGE) 1000 MG tablet    Other Visit Diagnoses    Hyperlipidemia LDL goal <70       Relevant Medications   pravastatin (PRAVACHOL) 10 MG tablet   Other Relevant Orders   Lipid panel (Completed)   Comprehensive metabolic panel (Completed)   Gastroesophageal reflux disease       Relevant Medications   omeprazole (PRILOSEC) 40 MG capsule   DM (diabetes mellitus) type II, controlled, with peripheral vascular disorder ([facility])       Relevant Medications   pravastatin (PRAVACHOL) 10 MG tablet  metFORMIN (GLUCOPHAGE) 1000 MG tablet   Other Relevant Orders   Hemoglobin A1c (Completed)   Comprehensive metabolic panel (Completed)    Osteopenia of multiple sites       Relevant Medications   alendronate (FOSAMAX) 70 MG tablet      Follow-up: Return in about 6 months (around 09/17/2020), or if symptoms worsen or fail to improve, for annual exam, fasting.  Ann Held, DO

## 2020-03-28 ENCOUNTER — Other Ambulatory Visit: Payer: Self-pay

## 2020-03-28 DIAGNOSIS — R799 Abnormal finding of blood chemistry, unspecified: Secondary | ICD-10-CM

## 2020-04-12 ENCOUNTER — Other Ambulatory Visit: Payer: Self-pay | Admitting: Family Medicine

## 2020-04-12 DIAGNOSIS — D649 Anemia, unspecified: Secondary | ICD-10-CM | POA: Diagnosis not present

## 2020-04-12 DIAGNOSIS — E039 Hypothyroidism, unspecified: Secondary | ICD-10-CM

## 2020-04-12 DIAGNOSIS — N183 Chronic kidney disease, stage 3 unspecified: Secondary | ICD-10-CM | POA: Diagnosis not present

## 2020-04-12 DIAGNOSIS — E1122 Type 2 diabetes mellitus with diabetic chronic kidney disease: Secondary | ICD-10-CM | POA: Diagnosis not present

## 2020-04-12 DIAGNOSIS — E785 Hyperlipidemia, unspecified: Secondary | ICD-10-CM | POA: Diagnosis not present

## 2020-04-12 DIAGNOSIS — I129 Hypertensive chronic kidney disease with stage 1 through stage 4 chronic kidney disease, or unspecified chronic kidney disease: Secondary | ICD-10-CM | POA: Diagnosis not present

## 2020-04-13 ENCOUNTER — Other Ambulatory Visit: Payer: Self-pay | Admitting: Nephrology

## 2020-04-13 DIAGNOSIS — I129 Hypertensive chronic kidney disease with stage 1 through stage 4 chronic kidney disease, or unspecified chronic kidney disease: Secondary | ICD-10-CM

## 2020-04-13 DIAGNOSIS — E785 Hyperlipidemia, unspecified: Secondary | ICD-10-CM

## 2020-04-13 DIAGNOSIS — N183 Chronic kidney disease, stage 3 unspecified: Secondary | ICD-10-CM

## 2020-04-17 DIAGNOSIS — J301 Allergic rhinitis due to pollen: Secondary | ICD-10-CM | POA: Diagnosis not present

## 2020-04-17 DIAGNOSIS — J3081 Allergic rhinitis due to animal (cat) (dog) hair and dander: Secondary | ICD-10-CM | POA: Diagnosis not present

## 2020-04-17 DIAGNOSIS — J3089 Other allergic rhinitis: Secondary | ICD-10-CM | POA: Diagnosis not present

## 2020-04-19 DIAGNOSIS — J301 Allergic rhinitis due to pollen: Secondary | ICD-10-CM | POA: Diagnosis not present

## 2020-04-19 DIAGNOSIS — J3089 Other allergic rhinitis: Secondary | ICD-10-CM | POA: Diagnosis not present

## 2020-05-10 ENCOUNTER — Other Ambulatory Visit: Payer: Self-pay | Admitting: Family Medicine

## 2020-05-10 DIAGNOSIS — E1139 Type 2 diabetes mellitus with other diabetic ophthalmic complication: Secondary | ICD-10-CM

## 2020-05-11 DIAGNOSIS — Z1231 Encounter for screening mammogram for malignant neoplasm of breast: Secondary | ICD-10-CM | POA: Diagnosis not present

## 2020-05-11 LAB — HM MAMMOGRAPHY

## 2020-05-19 DIAGNOSIS — J3081 Allergic rhinitis due to animal (cat) (dog) hair and dander: Secondary | ICD-10-CM | POA: Diagnosis not present

## 2020-05-19 DIAGNOSIS — J3089 Other allergic rhinitis: Secondary | ICD-10-CM | POA: Diagnosis not present

## 2020-05-19 DIAGNOSIS — J301 Allergic rhinitis due to pollen: Secondary | ICD-10-CM | POA: Diagnosis not present

## 2020-05-23 DIAGNOSIS — J301 Allergic rhinitis due to pollen: Secondary | ICD-10-CM | POA: Diagnosis not present

## 2020-05-23 DIAGNOSIS — J3089 Other allergic rhinitis: Secondary | ICD-10-CM | POA: Diagnosis not present

## 2020-05-25 DIAGNOSIS — H353130 Nonexudative age-related macular degeneration, bilateral, stage unspecified: Secondary | ICD-10-CM | POA: Diagnosis not present

## 2020-05-25 DIAGNOSIS — H35373 Puckering of macula, bilateral: Secondary | ICD-10-CM | POA: Diagnosis not present

## 2020-05-26 DIAGNOSIS — J3089 Other allergic rhinitis: Secondary | ICD-10-CM | POA: Diagnosis not present

## 2020-05-26 DIAGNOSIS — J301 Allergic rhinitis due to pollen: Secondary | ICD-10-CM | POA: Diagnosis not present

## 2020-05-30 DIAGNOSIS — J3089 Other allergic rhinitis: Secondary | ICD-10-CM | POA: Diagnosis not present

## 2020-05-30 DIAGNOSIS — J301 Allergic rhinitis due to pollen: Secondary | ICD-10-CM | POA: Diagnosis not present

## 2020-06-02 DIAGNOSIS — J3089 Other allergic rhinitis: Secondary | ICD-10-CM | POA: Diagnosis not present

## 2020-06-02 DIAGNOSIS — J301 Allergic rhinitis due to pollen: Secondary | ICD-10-CM | POA: Diagnosis not present

## 2020-06-05 ENCOUNTER — Ambulatory Visit
Admission: RE | Admit: 2020-06-05 | Discharge: 2020-06-05 | Disposition: A | Discharge status: Home / Self Care | Payer: Medicare Other | Source: Ambulatory Visit | Attending: Nephrology | Admitting: Nephrology

## 2020-06-05 DIAGNOSIS — E785 Hyperlipidemia, unspecified: Secondary | ICD-10-CM

## 2020-06-05 DIAGNOSIS — I129 Hypertensive chronic kidney disease with stage 1 through stage 4 chronic kidney disease, or unspecified chronic kidney disease: Secondary | ICD-10-CM

## 2020-06-05 DIAGNOSIS — N183 Chronic kidney disease, stage 3 unspecified: Secondary | ICD-10-CM

## 2020-06-05 DIAGNOSIS — N281 Cyst of kidney, acquired: Secondary | ICD-10-CM | POA: Diagnosis not present

## 2020-06-05 DIAGNOSIS — I10 Essential (primary) hypertension: Secondary | ICD-10-CM | POA: Diagnosis not present

## 2020-06-09 ENCOUNTER — Other Ambulatory Visit: Payer: Self-pay | Admitting: Family Medicine

## 2020-06-09 DIAGNOSIS — I10 Essential (primary) hypertension: Secondary | ICD-10-CM

## 2020-06-30 DIAGNOSIS — J301 Allergic rhinitis due to pollen: Secondary | ICD-10-CM | POA: Diagnosis not present

## 2020-06-30 DIAGNOSIS — J3081 Allergic rhinitis due to animal (cat) (dog) hair and dander: Secondary | ICD-10-CM | POA: Diagnosis not present

## 2020-06-30 DIAGNOSIS — J3089 Other allergic rhinitis: Secondary | ICD-10-CM | POA: Diagnosis not present

## 2020-07-08 ENCOUNTER — Other Ambulatory Visit: Payer: Self-pay | Admitting: Family Medicine

## 2020-07-08 DIAGNOSIS — IMO0002 Reserved for concepts with insufficient information to code with codable children: Secondary | ICD-10-CM

## 2020-07-08 DIAGNOSIS — E1151 Type 2 diabetes mellitus with diabetic peripheral angiopathy without gangrene: Secondary | ICD-10-CM

## 2020-07-17 DIAGNOSIS — D692 Other nonthrombocytopenic purpura: Secondary | ICD-10-CM | POA: Diagnosis not present

## 2020-07-17 DIAGNOSIS — L438 Other lichen planus: Secondary | ICD-10-CM | POA: Diagnosis not present

## 2020-07-17 DIAGNOSIS — L570 Actinic keratosis: Secondary | ICD-10-CM | POA: Diagnosis not present

## 2020-07-17 DIAGNOSIS — D225 Melanocytic nevi of trunk: Secondary | ICD-10-CM | POA: Diagnosis not present

## 2020-08-08 DIAGNOSIS — J3081 Allergic rhinitis due to animal (cat) (dog) hair and dander: Secondary | ICD-10-CM | POA: Diagnosis not present

## 2020-08-08 DIAGNOSIS — J3089 Other allergic rhinitis: Secondary | ICD-10-CM | POA: Diagnosis not present

## 2020-08-08 DIAGNOSIS — J301 Allergic rhinitis due to pollen: Secondary | ICD-10-CM | POA: Diagnosis not present

## 2020-08-21 ENCOUNTER — Ambulatory Visit: Payer: Medicare Other | Admitting: *Deleted

## 2020-09-05 ENCOUNTER — Other Ambulatory Visit: Payer: Self-pay | Admitting: Family Medicine

## 2020-09-05 DIAGNOSIS — E785 Hyperlipidemia, unspecified: Secondary | ICD-10-CM

## 2020-09-05 DIAGNOSIS — I10 Essential (primary) hypertension: Secondary | ICD-10-CM

## 2020-09-05 DIAGNOSIS — E1151 Type 2 diabetes mellitus with diabetic peripheral angiopathy without gangrene: Secondary | ICD-10-CM

## 2020-09-19 ENCOUNTER — Encounter: Payer: Self-pay | Admitting: Family Medicine

## 2020-09-19 ENCOUNTER — Other Ambulatory Visit: Payer: Self-pay

## 2020-09-19 ENCOUNTER — Ambulatory Visit (INDEPENDENT_AMBULATORY_CARE_PROVIDER_SITE_OTHER): Payer: Medicare Other | Admitting: Family Medicine

## 2020-09-19 VITALS — BP 140/70 | HR 77 | Temp 98.0°F | Resp 18 | Ht 62.0 in | Wt 129.2 lb

## 2020-09-19 DIAGNOSIS — Z86010 Personal history of colonic polyps: Secondary | ICD-10-CM

## 2020-09-19 DIAGNOSIS — J4520 Mild intermittent asthma, uncomplicated: Secondary | ICD-10-CM | POA: Diagnosis not present

## 2020-09-19 DIAGNOSIS — I10 Essential (primary) hypertension: Secondary | ICD-10-CM | POA: Diagnosis not present

## 2020-09-19 DIAGNOSIS — IMO0002 Reserved for concepts with insufficient information to code with codable children: Secondary | ICD-10-CM

## 2020-09-19 DIAGNOSIS — J3089 Other allergic rhinitis: Secondary | ICD-10-CM | POA: Diagnosis not present

## 2020-09-19 DIAGNOSIS — E785 Hyperlipidemia, unspecified: Secondary | ICD-10-CM | POA: Diagnosis not present

## 2020-09-19 DIAGNOSIS — Z0000 Encounter for general adult medical examination without abnormal findings: Secondary | ICD-10-CM | POA: Diagnosis not present

## 2020-09-19 DIAGNOSIS — E039 Hypothyroidism, unspecified: Secondary | ICD-10-CM

## 2020-09-19 DIAGNOSIS — J301 Allergic rhinitis due to pollen: Secondary | ICD-10-CM | POA: Diagnosis not present

## 2020-09-19 DIAGNOSIS — E1169 Type 2 diabetes mellitus with other specified complication: Secondary | ICD-10-CM

## 2020-09-19 DIAGNOSIS — E1151 Type 2 diabetes mellitus with diabetic peripheral angiopathy without gangrene: Secondary | ICD-10-CM

## 2020-09-19 DIAGNOSIS — E1165 Type 2 diabetes mellitus with hyperglycemia: Secondary | ICD-10-CM | POA: Diagnosis not present

## 2020-09-19 DIAGNOSIS — J3081 Allergic rhinitis due to animal (cat) (dog) hair and dander: Secondary | ICD-10-CM | POA: Diagnosis not present

## 2020-09-19 LAB — CBC WITH DIFFERENTIAL/PLATELET
Basophils Absolute: 0 10*3/uL (ref 0.0–0.1)
Basophils Relative: 0.7 % (ref 0.0–3.0)
Eosinophils Absolute: 0.2 10*3/uL (ref 0.0–0.7)
Eosinophils Relative: 3.2 % (ref 0.0–5.0)
HCT: 35.5 % — ABNORMAL LOW (ref 36.0–46.0)
Hemoglobin: 12.2 g/dL (ref 12.0–15.0)
Lymphocytes Relative: 43.5 % (ref 12.0–46.0)
Lymphs Abs: 2.8 10*3/uL (ref 0.7–4.0)
MCHC: 34.2 g/dL (ref 30.0–36.0)
MCV: 96.7 fl (ref 78.0–100.0)
Monocytes Absolute: 0.3 10*3/uL (ref 0.1–1.0)
Monocytes Relative: 4.5 % (ref 3.0–12.0)
Neutro Abs: 3.1 10*3/uL (ref 1.4–7.7)
Neutrophils Relative %: 48.1 % (ref 43.0–77.0)
Platelets: 199 10*3/uL (ref 150.0–400.0)
RBC: 3.67 Mil/uL — ABNORMAL LOW (ref 3.87–5.11)
RDW: 12.6 % (ref 11.5–15.5)
WBC: 6.4 10*3/uL (ref 4.0–10.5)

## 2020-09-19 LAB — LIPID PANEL
Cholesterol: 139 mg/dL (ref 0–200)
HDL: 44.6 mg/dL (ref >39.00)
LDL Cholesterol: 69 mg/dL (ref 0–99)
NonHDL: 94.69
Total CHOL/HDL Ratio: 3
Triglycerides: 126 mg/dL (ref 0.0–149.0)
VLDL: 25.2 mg/dL (ref 0.0–40.0)

## 2020-09-19 LAB — COMPREHENSIVE METABOLIC PANEL
ALT: 15 U/L (ref 0–35)
AST: 14 U/L (ref 0–37)
Albumin: 4.7 g/dL (ref 3.5–5.2)
Alkaline Phosphatase: 26 U/L — ABNORMAL LOW (ref 39–117)
BUN: 29 mg/dL — ABNORMAL HIGH (ref 6–23)
CO2: 26 mEq/L (ref 19–32)
Calcium: 9.6 mg/dL (ref 8.4–10.5)
Chloride: 102 mEq/L (ref 96–112)
Creatinine, Ser: 1.41 mg/dL — ABNORMAL HIGH (ref 0.40–1.20)
GFR: 36.12 mL/min — ABNORMAL LOW (ref >60.00)
Glucose, Bld: 147 mg/dL — ABNORMAL HIGH (ref 70–99)
Potassium: 4 mEq/L (ref 3.5–5.1)
Sodium: 140 mEq/L (ref 135–145)
Total Bilirubin: 0.5 mg/dL (ref 0.2–1.2)
Total Protein: 7.3 g/dL (ref 6.0–8.3)

## 2020-09-19 LAB — MICROALBUMIN / CREATININE URINE RATIO
Creatinine,U: 92.8 mg/dL
Microalb Creat Ratio: 3.5 mg/g (ref 0.0–30.0)
Microalb, Ur: 3.3 mg/dL — ABNORMAL HIGH (ref 0.0–1.9)

## 2020-09-19 LAB — HEMOGLOBIN A1C: Hgb A1c MFr Bld: 7.1 % — ABNORMAL HIGH (ref 4.6–6.5)

## 2020-09-19 LAB — TSH: TSH: 1.05 u[IU]/mL (ref 0.35–4.50)

## 2020-09-19 MED ORDER — NATEGLINIDE 120 MG PO TABS: 120.0000 mg | ORAL_TABLET | Freq: Three times a day (TID) | ORAL | 1 refills | Status: DC

## 2020-09-19 MED ORDER — OLMESARTAN MEDOXOMIL-HCTZ 40-12.5 MG PO TABS: 1.0000 | ORAL_TABLET | Freq: Every day | ORAL | 1 refills | Status: DC

## 2020-09-19 NOTE — Progress Notes (Signed)
Subjective:   By signing my name below, I, [name], attest that this documentation has been prepared under the direction and in the presence of Dr. Roma Schanz, DO. 09/19/2020    Patient ID: Cheryl Cisneros, female    DOB: 07-04-1945, 75 y.o.   MRN: [id number]  Chief Complaint  Patient presents with  . Annual Exam    Pt states fasting     HPI Patient is in today for a comprehensive physical exam.  Her blood pressure is elevated today. She brought her blood pressure log for the past 2 months. Her systolic BP ranges from 409-811 and her diastolic BP ranges from 91-478 for the month of May, 2022. For the month of April, 2956 her systolic BP ranges from 213-086 and her diastolic BP ranges from 57-84. Her blood sugar ranges ranges from 139-182 for the month of May, 2022.  She reports her nephrologist recently diagnosed her with 2 kidney stones. She has a history of kidney stones in her left kidney. She takes calcium supplements at this time. She denies having any headaches at this time. She has taken 3 pfizer Covid-19 vaccinations at this time. She is not willing to receive the 4th vaccine due to experiencing a negative side effect from the 3 vaccine.   Health Maintenance  Topic Date Due  . COLONOSCOPY (Pts 45-57yrs Insurance coverage will need to be confirmed)  08/08/2020  . HEMOGLOBIN A1C  09/17/2020  . INFLUENZA VACCINE  11/20/2020  . OPHTHALMOLOGY EXAM  02/08/2021  . FOOT EXAM  03/20/2021  . DEXA SCAN  05/02/2021  . MAMMOGRAM  05/11/2021  . TETANUS/TDAP  06/29/2023  . COVID-19 Vaccine  Completed  . Hepatitis C Screening  Completed  . PNA vac Low Risk Adult  Completed  . Zoster Vaccines- Shingrix  Completed  . HPV VACCINES  Aged Out   Past Medical History:  Diagnosis Date  . Allergy   . Arthritis   . Asthma   . Borderline glaucoma   . Cataract   . Diabetes ([place])   . GERD (gastroesophageal reflux disease)   . Hypertension   . Macular degeneration 12/25/2011   . Osteopenia   . Thyroid disease    Hypothyroidism    Past Surgical History:  Procedure Laterality Date  . ABDOMINAL HYSTERECTOMY  1986   TAH BSO  . BACK SURGERY  2016  . EYE SURGERY Right 07/28/2017   Dr.Beavis. Cataract removal  . KNEE ARTHROSCOPY Right 2002  . LUMBAR LAMINECTOMY  1999  . ROTATOR CUFF REPAIR Right 02/27/2005  . TONSILLECTOMY  1963    Family History  Problem Relation Age of Onset  . Diabetes Mother   . Hypertension Mother   . Kidney disease Mother        dialysis  . Diabetes Father   . Hypertension Father   . Cancer Father        lung  . Dementia Sister   . Diabetes Sister   . Hyperlipidemia Sister   . Hypertension Sister   . Diabetes Brother   . Hyperlipidemia Brother   . Hypertension Brother   . Diabetes Sister   . Hypertension Sister   . Hyperlipidemia Sister   . Other Sister        blood disorder  . Cervical cancer Daughter   . Cancer Daughter 67       cervical  . Coronary artery disease Other   . Lung cancer Other   . Colon cancer Neg Hx  Social History   Socioeconomic History  . Marital status: Married    Spouse name: Not on file  . Number of children: Not on file  . Years of education: Not on file  . Highest education level: Not on file  Occupational History  . Occupation: housewife    Employer: UNEMPLOYED  Tobacco Use  . Smoking status: Never Smoker  . Smokeless tobacco: Never Used  Substance and Sexual Activity  . Alcohol use: No    Alcohol/week: 0.0 standard drinks  . Drug use: No  . Sexual activity: Not Currently    Partners: Male  Other Topics Concern  . Not on file  Social History Narrative   Exercise--  [employer] 3 days a week and walking   Social Determinants of Health   Financial Resource Strain: Not on file  Food Insecurity: Not on file  Transportation Needs: Not on file  Physical Activity: Not on file  Stress: Not on file  Social Connections: Not on file  Intimate Partner Violence: Not on file     Outpatient Medications Prior to Visit  Medication Sig Dispense Refill  . alendronate (FOSAMAX) 70 MG tablet TAKE ONE TABLET BY MOUTH ONCE A WEEK IN THE MORNING WITH A FULL GLASS OF WATER, 30 MINUTES BEFORE A MEAL OR BEVERAGE. REMAIN UPRIGHT 12 tablet 3  . aspirin EC 81 MG tablet Take 1 tablet (81 mg total) by mouth daily.    . Biotin 1000 MCG tablet Take 5,000 mcg by mouth 2 (two) times daily.     . Calcium-Vitamin D 600-200 MG-UNIT per tablet Take 1 tablet by mouth 3 (three) times daily with meals.    . Cholecalciferol (VITAMIN D3) 1000 UNITS CAPS Take 1 capsule by mouth daily.    Marland Kitchen EPIPEN 2-PAK 0.3 MG/0.3ML DEVI Reported on 07/26/2015    . EQ ALLERGY RELIEF, CETIRIZINE, 10 MG tablet Take 1 tablet by mouth once daily 90 tablet 1  . Garlic Oil 6759 MG CAPS Take 1 capsule by mouth daily.    Marland Kitchen glucose blood (ONETOUCH ULTRA) test strip USE  STRIP TO CHECK GLUCOSE TWICE DAILY 200 each 12  . L-Lysine 500 MG TABS Take 1 tablet by mouth daily.    . Lancets (ONETOUCH DELICA PLUS [id number]) MISC Check blood sugar twice daily 200 each 12  . levothyroxine (SYNTHROID) 100 MCG tablet Take 1 tablet by mouth once daily 90 tablet 1  . Magnesium 250 MG TABS Take 1 tablet by mouth daily.    . metFORMIN (GLUCOPHAGE) 1000 MG tablet TAKE 1 TABLET BY MOUTH TWICE DAILY WITH MEALS 180 tablet 0  . NONFORMULARY OR COMPOUNDED ITEM viviscal advanced hair health  1 po bid 30 each 5  . Omega-3 Fatty Acids (OMEGA 3 PO) Take 2 tablets by mouth daily.    Marland Kitchen omeprazole (PRILOSEC) 40 MG capsule Take 1 capsule (40 mg total) by mouth daily. 90 capsule 3  . OVER THE COUNTER MEDICATION Maxi-vision 2 capsules per day    . pravastatin (PRAVACHOL) 10 MG tablet Take 1 tablet by mouth once daily 90 tablet 0  . SIMPLY SALINE NA Place 1 spray into the nose daily.    Marland Kitchen Specialty Vitamins Products (HAIR NOURISHING SUPPLEMENT PO) Take by mouth. Per patient twice a day after meals.    . Turmeric 500 MG CAPS Take 1 capsule by mouth 3  (three) times daily.     . vitamin C (ASCORBIC ACID) 500 MG tablet Take 1,000 mg by mouth daily.    Marland Kitchen  nateglinide (STARLIX) 60 MG tablet Take 1 tablet (60 mg total) by mouth 3 (three) times daily with meals. 270 tablet 1  . olmesartan-hydrochlorothiazide (BENICAR HCT) 20-12.5 MG tablet Take 1 tablet by mouth once daily 90 tablet 0   No facility-administered medications prior to visit.    Allergies  Allergen Reactions  . Losartan Potassium Other (See Comments)    Hair loss  . Metoprolol   . Norvasc [Amlodipine Besylate] Other (See Comments)    HAIR LOSS    Review of Systems  Constitutional: Negative for chills, fever and malaise/fatigue.  HENT: Negative for congestion and hearing loss.   Eyes: Negative for blurred vision and discharge.  Respiratory: Negative for cough, sputum production and shortness of breath.   Cardiovascular: Negative for chest pain, palpitations and leg swelling.  Gastrointestinal: Negative for abdominal pain, blood in stool, constipation, diarrhea, heartburn, nausea and vomiting.  Genitourinary: Negative for dysuria, frequency, hematuria and urgency.  Musculoskeletal: Negative for back pain, falls and myalgias.  Skin: Negative for rash.  Neurological: Negative for dizziness, sensory change, loss of consciousness, weakness and headaches.  Endo/Heme/Allergies: Negative for environmental allergies. Does not bruise/bleed easily.  Psychiatric/Behavioral: Negative for depression and suicidal ideas. The patient is not nervous/anxious and does not have insomnia.        Objective:    Physical Exam Constitutional:      General: She is not in acute distress.    Appearance: Normal appearance. She is not ill-appearing.  HENT:     Head: Normocephalic and atraumatic.     Right Ear: Tympanic membrane and external ear normal.     Left Ear: Tympanic membrane and external ear normal.  Eyes:     Extraocular Movements: Extraocular movements intact.     Pupils: Pupils are  equal, round, and reactive to light.  Cardiovascular:     Rate and Rhythm: Normal rate and regular rhythm.     Pulses: Normal pulses.     Heart sounds: Normal heart sounds. No murmur heard. No gallop.   Pulmonary:     Effort: Pulmonary effort is normal. No respiratory distress.     Breath sounds: Normal breath sounds. No wheezing, rhonchi or rales.  Abdominal:     General: Bowel sounds are normal. There is no distension.     Palpations: Abdomen is soft. There is no mass.     Tenderness: There is no abdominal tenderness. There is no guarding or rebound.     Hernia: No hernia is present.  Skin:    General: Skin is warm and dry.  Neurological:     Mental Status: She is alert and oriented to person, place, and time.  Psychiatric:        Behavior: Behavior normal.     BP 140/70 (BP Location: Right Arm, Patient Position: Sitting, Cuff Size: Normal)   Pulse 77   Temp 98 F (36.7 C) (Oral)   Resp 18   Ht 5\' 2"  (1.575 m)   Wt 129 lb 3.2 oz (58.6 kg)   SpO2 98%   BMI 23.63 kg/m  Wt Readings from Last 3 Encounters:  09/19/20 129 lb 3.2 oz (58.6 kg)  03/20/20 132 lb 12.8 oz (60.2 kg)  09/13/19 132 lb 9.6 oz (60.1 kg)    Diabetic Foot Exam - Simple   No data filed    Lab Results  Component Value Date   WBC 6.0 03/20/2020   HGB 12.0 03/20/2020   HCT 34.9 (L) 03/20/2020   PLT 194.0 03/20/2020  GLUCOSE 137 (H) 03/20/2020   CHOL 121 03/20/2020   TRIG 128.0 03/20/2020   HDL 44.70 03/20/2020   LDLCALC 51 03/20/2020   ALT 23 03/20/2020   AST 18 03/20/2020   NA 139 03/20/2020   K 4.0 03/20/2020   CL 100 03/20/2020   CREATININE 1.30 (H) 03/20/2020   BUN 30 (H) 03/20/2020   CO2 27 03/20/2020   TSH 1.00 09/13/2019   HGBA1C 6.5 03/20/2020   MICROALBUR 0.8 09/13/2019    Lab Results  Component Value Date   TSH 1.00 09/13/2019   Lab Results  Component Value Date   WBC 6.0 03/20/2020   HGB 12.0 03/20/2020   HCT 34.9 (L) 03/20/2020   MCV 96.0 03/20/2020   PLT 194.0  03/20/2020   Lab Results  Component Value Date   NA 139 03/20/2020   K 4.0 03/20/2020   CO2 27 03/20/2020   GLUCOSE 137 (H) 03/20/2020   BUN 30 (H) 03/20/2020   CREATININE 1.30 (H) 03/20/2020   BILITOT 0.5 03/20/2020   ALKPHOS 23 (L) 03/20/2020   AST 18 03/20/2020   ALT 23 03/20/2020   PROT 7.4 03/20/2020   ALBUMIN 4.7 03/20/2020   CALCIUM 9.5 03/20/2020   GFR 39.96 (L) 03/20/2020   Lab Results  Component Value Date   CHOL 121 03/20/2020   Lab Results  Component Value Date   HDL 44.70 03/20/2020   Lab Results  Component Value Date   LDLCALC 51 03/20/2020   Lab Results  Component Value Date   TRIG 128.0 03/20/2020   Lab Results  Component Value Date   CHOLHDL 3 03/20/2020   Lab Results  Component Value Date   HGBA1C 6.5 03/20/2020   Mammogram- Last completed 05/11/2020. Results normal. Repeat in 1 year.  Dexa- Last completed 05/03/19.  Pap Smear- Last completed 12/24/2020. Results normal. Colonoscopy- Last completed 08/09/2015. Results showed 7 mm polyp in sigmoid colon, internal hemmorhoids, multiple small-mouthed diverticula in sigmoid and descending colon, otherwise the results were normal. Repeat in 5 years.    Assessment & Plan:   Problem List Items Addressed This Visit      Unprioritized   Asthma    Stable con't meds       DM (diabetes mellitus) type II uncontrolled, periph vascular disorder ([place])    Inc starlix to 120 mg  Check labs       Relevant Medications   nateglinide (STARLIX) 120 MG tablet   olmesartan-hydrochlorothiazide (BENICAR HCT) 40-12.5 MG tablet   Essential hypertension    Poorly controlled will alter medications, encouraged DASH diet, minimize caffeine and obtain adequate sleep. Report concerning symptoms and follow up as directed and as needed      Relevant Medications   olmesartan-hydrochlorothiazide (BENICAR HCT) 40-12.5 MG tablet   Hyperlipidemia associated with type 2 diabetes mellitus ([place])    Tolerating statin,  encouraged heart healthy diet, avoid trans fats, minimize simple carbs and saturated fats. Increase exercise as tolerated      Relevant Medications   nateglinide (STARLIX) 120 MG tablet   olmesartan-hydrochlorothiazide (BENICAR HCT) 40-12.5 MG tablet   Other Relevant Orders   Lipid panel   Comprehensive metabolic panel   Hypothyroidism    Check labs  con't synthroid      Relevant Orders   TSH   Preventative health care - Primary    ghm utd Check labs         Other Visit Diagnoses    Type 2 diabetes mellitus with hyperglycemia, without long-term current  use of insulin ([facility])       Relevant Medications   nateglinide (STARLIX) 120 MG tablet   olmesartan-hydrochlorothiazide (BENICAR HCT) 40-12.5 MG tablet   Other Relevant Orders   Hemoglobin A1c   Comprehensive metabolic panel   Microalbumin / creatinine urine ratio   Primary hypertension       Relevant Medications   olmesartan-hydrochlorothiazide (BENICAR HCT) 40-12.5 MG tablet   Other Relevant Orders   Lipid panel   CBC with Differential/Platelet   Comprehensive metabolic panel   Microalbumin / creatinine urine ratio   TSH   History of colon polyps       Relevant Orders   Ambulatory referral to Gastroenterology       Meds ordered this encounter  Medications  . nateglinide (STARLIX) 120 MG tablet    Sig: Take 1 tablet (120 mg total) by mouth 3 (three) times daily with meals.    Dispense:  270 tablet    Refill:  1  . olmesartan-hydrochlorothiazide (BENICAR HCT) 40-12.5 MG tablet    Sig: Take 1 tablet by mouth daily.    Dispense:  90 tablet    Refill:  1    I, Dr. Roma Schanz, DO, personally preformed the services described in this documentation.  All medical record entries made by the scribe were at my direction and in my presence.  I have reviewed the chart and discharge instructions (if applicable) and agree that the record reflects my personal performance and is accurate and complete.  09/19/2020   I,[name],acting as a scribe for Ann Held, DO.,have documented all relevant documentation on the behalf of Ann Held, DO,as directed by  Ann Held, DO while in the presence of Ann Held, DO.    Ann Held, DO

## 2020-09-19 NOTE — Patient Instructions (Signed)
Preventive Care 61 Years and Older, Female Preventive care refers to lifestyle choices and visits with your health care provider that can promote health and wellness. This includes:  A yearly physical exam. This is also called an annual wellness visit.  Regular dental and eye exams.  Immunizations.  Screening for certain conditions.  Healthy lifestyle choices, such as: ? Eating a healthy diet. ? Getting regular exercise. ? Not using drugs or products that contain nicotine and tobacco. ? Limiting alcohol use. What can I expect for my preventive care visit? Physical exam Your health care provider will check your:  Height and weight. These may be used to calculate your BMI (body mass index). BMI is a measurement that tells if you are at a healthy weight.  Heart rate and blood pressure.  Body temperature.  Skin for abnormal spots. Counseling Your health care provider may ask you questions about your:  Past medical problems.  Family's medical history.  Alcohol, tobacco, and drug use.  Emotional well-being.  Home life and relationship well-being.  Sexual activity.  Diet, exercise, and sleep habits.  History of falls.  Memory and ability to understand (cognition).  Work and work Statistician.  Pregnancy and menstrual history.  Access to firearms. What immunizations do I need? Vaccines are usually given at various ages, according to a schedule. Your health care provider will recommend vaccines for you based on your age, medical history, and lifestyle or other factors, such as travel or where you work.   What tests do I need? Blood tests  Lipid and cholesterol levels. These may be checked every 5 years, or more often depending on your overall health.  Hepatitis C test.  Hepatitis B test. Screening  Lung cancer screening. You may have this screening every year starting at age 74 if you have a 30-pack-year history of smoking and currently smoke or have quit within  the past 15 years.  Colorectal cancer screening. ? All adults should have this screening starting at age 44 and continuing until age 58. ? Your health care provider may recommend screening at age 2 if you are at increased risk. ? You will have tests every 1-10 years, depending on your results and the type of screening test.  Diabetes screening. ? This is done by checking your blood sugar (glucose) after you have not eaten for a while (fasting). ? You may have this done every 1-3 years.  Mammogram. ? This may be done every 1-2 years. ? Talk with your health care provider about how often you should have regular mammograms.  Abdominal aortic aneurysm (AAA) screening. You may need this if you are a current or former smoker.  BRCA-related cancer screening. This may be done if you have a family history of breast, ovarian, tubal, or peritoneal cancers. Other tests  STD (sexually transmitted disease) testing, if you are at risk.  Bone density scan. This is done to screen for osteoporosis. You may have this done starting at age 104. Talk with your health care provider about your test results, treatment options, and if necessary, the need for more tests. Follow these instructions at home: Eating and drinking  Eat a diet that includes fresh fruits and vegetables, whole grains, lean protein, and low-fat dairy products. Limit your intake of foods with high amounts of sugar, saturated fats, and salt.  Take vitamin and mineral supplements as recommended by your health care provider.  Do not drink alcohol if your health care provider tells you not to drink.  If you drink alcohol: ? Limit how much you have to 0-1 drink a day. ? Be aware of how much alcohol is in your drink. In the [place] one drink equals one 12 oz bottle of beer (355 mL), one 5 oz glass of wine (148 mL), or one 1 oz glass of hard liquor (44 mL).   Lifestyle  Take daily care of your teeth and gums. Brush your teeth every morning  and night with fluoride toothpaste. Floss one time each day.  Stay active. Exercise for at least 30 minutes 5 or more days each week.  Do not use any products that contain nicotine or tobacco, such as cigarettes, e-cigarettes, and chewing tobacco. If you need help quitting, ask your health care provider.  Do not use drugs.  If you are sexually active, practice safe sex. Use a condom or other form of protection in order to prevent STIs (sexually transmitted infections).  Talk with your health care provider about taking a low-dose aspirin or statin.  Find healthy ways to cope with stress, such as: ? Meditation, yoga, or listening to music. ? Journaling. ? Talking to a trusted person. ? Spending time with friends and family. Safety  Always wear your seat belt while driving or riding in a vehicle.  Do not drive: ? If you have been drinking alcohol. Do not ride with someone who has been drinking. ? When you are tired or distracted. ? While texting.  Wear a helmet and other protective equipment during sports activities.  If you have firearms in your house, make sure you follow all gun safety procedures. What's next?  Visit your health care provider once a year for an annual wellness visit.  Ask your health care provider how often you should have your eyes and teeth checked.  Stay up to date on all vaccines. This information is not intended to replace advice given to you by your health care provider. Make sure you discuss any questions you have with your health care provider. Document Revised: 03/29/2020 Document Reviewed: 04/02/2018 Elsevier Patient Education  2021 [employer] Inc.  

## 2020-09-19 NOTE — Assessment & Plan Note (Signed)
Check labs con't synthroid 

## 2020-09-19 NOTE — Assessment & Plan Note (Signed)
Stable con't meds 

## 2020-09-19 NOTE — Assessment & Plan Note (Signed)
ghm utd Check labs  

## 2020-09-19 NOTE — Assessment & Plan Note (Signed)
Inc starlix to 120 mg  Check labs

## 2020-09-19 NOTE — Assessment & Plan Note (Signed)
Tolerating statin, encouraged heart healthy diet, avoid trans fats, minimize simple carbs and saturated fats. Increase exercise as tolerated 

## 2020-09-19 NOTE — Assessment & Plan Note (Signed)
Poorly controlled will alter medications, encouraged DASH diet, minimize caffeine and obtain adequate sleep. Report concerning symptoms and follow up as directed and as needed 

## 2020-09-21 ENCOUNTER — Ambulatory Visit: Payer: Medicare Other

## 2020-09-22 ENCOUNTER — Other Ambulatory Visit: Payer: Self-pay | Admitting: Family Medicine

## 2020-09-22 DIAGNOSIS — E1165 Type 2 diabetes mellitus with hyperglycemia: Secondary | ICD-10-CM

## 2020-09-25 ENCOUNTER — Telehealth: Payer: Self-pay

## 2020-09-25 NOTE — Telephone Encounter (Signed)
While I was on the pone with the pt giving her-her lab results she mentioned that her wellness visit had been cancelled and needed to be rescheduled.

## 2020-10-03 NOTE — Telephone Encounter (Signed)
Called patient & scheduled an appt for Medicare Wellness Exam.

## 2020-10-05 ENCOUNTER — Encounter: Payer: Self-pay | Admitting: Gastroenterology

## 2020-10-05 ENCOUNTER — Ambulatory Visit: Payer: Medicare Other

## 2020-10-07 ENCOUNTER — Other Ambulatory Visit: Payer: Self-pay | Admitting: Family Medicine

## 2020-10-07 DIAGNOSIS — E039 Hypothyroidism, unspecified: Secondary | ICD-10-CM

## 2020-10-09 ENCOUNTER — Other Ambulatory Visit: Payer: Self-pay | Admitting: Family Medicine

## 2020-10-09 DIAGNOSIS — N183 Chronic kidney disease, stage 3 unspecified: Secondary | ICD-10-CM | POA: Diagnosis not present

## 2020-10-12 ENCOUNTER — Ambulatory Visit: Payer: Medicare Other

## 2020-10-13 NOTE — Progress Notes (Signed)
Subjective:   NAILEA WHITEHORN is a 75 y.o. female who presents for Medicare Annual (Subsequent) preventive examination.  Review of Systems     Cardiac Risk Factors include: advanced age (>23men, >32 women);diabetes mellitus;hypertension;dyslipidemia     Objective:    Today's Vitals   10/16/20 0852  BP: (!) 158/80  Pulse: 99  Resp: 16  Temp: (!) 97.4 F (36.3 C)  TempSrc: Temporal  SpO2: 99%  Weight: 132 lb 6.4 oz (60.1 kg)  Height: 5\' 2"  (1.575 m)   Body mass index is 24.22 kg/m.  Advanced Directives 10/16/2020 08/19/2019 08/18/2018 08/15/2017 07/26/2015 06/05/2015  Does Patient Have a Medical Advance Directive? Yes Yes Yes Yes Yes Yes  Type of Paramedic of [place];Living will [place];Living will [place];Living will [place];Living will Living will;Healthcare Power of [place];Living will  Does patient want to make changes to medical advance directive? - No - Patient declined No - Patient declined No - Patient declined - No - Patient declined  Copy of [place] in Chart? No - copy requested No - copy requested No - copy requested No - copy requested - No - copy requested    Current Medications (verified) Outpatient Encounter Medications as of 10/16/2020  Medication Sig   alendronate (FOSAMAX) 70 MG tablet TAKE ONE TABLET BY MOUTH ONCE A WEEK IN THE MORNING WITH A FULL GLASS OF WATER, 30 MINUTES BEFORE A MEAL OR BEVERAGE. REMAIN UPRIGHT   aspirin EC 81 MG tablet Take 1 tablet (81 mg total) by mouth daily.   Biotin 1000 MCG tablet Take 5,000 mcg by mouth 2 (two) times daily.    Calcium-Vitamin D 600-200 MG-UNIT per tablet Take 1 tablet by mouth 3 (three) times daily with meals.   cetirizine (EQ ALLERGY RELIEF, CETIRIZINE,) 10 MG tablet Take 1 tablet by mouth once daily   Cholecalciferol (VITAMIN D3) 1000 UNITS CAPS Take 1 capsule by mouth daily.    EPIPEN 2-PAK 0.3 MG/0.3ML DEVI Reported on 07/28/2705   Garlic Oil 8675 MG CAPS Take 1 capsule by mouth daily.   glucose blood (ONETOUCH ULTRA) test strip USE  STRIP TO CHECK GLUCOSE TWICE DAILY   L-Lysine 500 MG TABS Take 1 tablet by mouth daily.   Lancets (ONETOUCH DELICA PLUS [id number]) MISC Check blood sugar twice daily   levothyroxine (SYNTHROID) 100 MCG tablet Take 1 tablet by mouth once daily   Magnesium 250 MG TABS Take 1 tablet by mouth daily.   metFORMIN (GLUCOPHAGE) 1000 MG tablet TAKE 1 TABLET BY MOUTH TWICE DAILY WITH MEALS   nateglinide (STARLIX) 120 MG tablet Take 1 tablet (120 mg total) by mouth 3 (three) times daily with meals.   NONFORMULARY OR COMPOUNDED ITEM viviscal advanced hair health  1 po bid   olmesartan-hydrochlorothiazide (BENICAR HCT) 40-12.5 MG tablet Take 1 tablet by mouth daily.   Omega-3 Fatty Acids (OMEGA 3 PO) Take 2 tablets by mouth daily.   omeprazole (PRILOSEC) 40 MG capsule Take 1 capsule (40 mg total) by mouth daily.   OVER THE COUNTER MEDICATION Maxi-vision 2 capsules per day   pravastatin (PRAVACHOL) 10 MG tablet Take 1 tablet by mouth once daily   SIMPLY SALINE NA Place 1 spray into the nose daily.   Specialty Vitamins Products (HAIR NOURISHING SUPPLEMENT PO) Take by mouth. Per patient twice a day after meals.   Turmeric 500 MG CAPS Take 1 capsule by mouth 3 (three) times  daily.    vitamin C (ASCORBIC ACID) 500 MG tablet Take 1,000 mg by mouth daily.   No facility-administered encounter medications on file as of 10/16/2020.    Allergies (verified) Losartan potassium, Metoprolol, and Norvasc [amlodipine besylate]   History: Past Medical History:  Diagnosis Date   Allergy    Arthritis    Asthma    Borderline glaucoma    Cataract    Diabetes ([place])    GERD (gastroesophageal reflux disease)    Hypertension    Macular degeneration 12/25/2011   Osteopenia    Thyroid disease    Hypothyroidism   Past Surgical History:  Procedure Laterality  Date   ABDOMINAL HYSTERECTOMY  1986   TAH BSO   BACK SURGERY  2016   EYE SURGERY Right 07/28/2017   Dr.Beavis. Cataract removal   KNEE ARTHROSCOPY Right 2002   LUMBAR LAMINECTOMY  1999   ROTATOR CUFF REPAIR Right 02/27/2005   TONSILLECTOMY  1963   Family History  Problem Relation Age of Onset   Diabetes Mother    Hypertension Mother    Kidney disease Mother        dialysis   Diabetes Father    Hypertension Father    Cancer Father        lung   Dementia Sister    Diabetes Sister    Hyperlipidemia Sister    Hypertension Sister    Diabetes Brother    Hyperlipidemia Brother    Hypertension Brother    Diabetes Sister    Hypertension Sister    Hyperlipidemia Sister    Other Sister        blood disorder   Cervical cancer Daughter    Cancer Daughter 36       cervical   Coronary artery disease Other    Lung cancer Other    Colon cancer Neg Hx    Social History   Socioeconomic History   Marital status: Married    Spouse name: Not on file   Number of children: Not on file   Years of education: Not on file   Highest education level: Not on file  Occupational History   Occupation: housewife    Employer: UNEMPLOYED  Tobacco Use   Smoking status: Never   Smokeless tobacco: Never  Substance and Sexual Activity   Alcohol use: No    Alcohol/week: 0.0 standard drinks   Drug use: No   Sexual activity: Not Currently    Partners: Male  Other Topics Concern   Not on file  Social History Narrative   Exercise--  bootcamp 3 days a week and walking   Social Determinants of Health   Financial Resource Strain: Low Risk    Difficulty of Paying Living Expenses: Not hard at all  Food Insecurity: No Food Insecurity   Worried About Charity fundraiser in the Last Year: Never true   Arboriculturist in the Last Year: Never true  Transportation Needs: No Transportation Needs   Lack of Transportation (Medical): No   Lack of Transportation (Non-Medical): No  Physical Activity:  Sufficiently Active   Days of Exercise per Week: 7 days   Minutes of Exercise per Session: 40 min  Stress: No Stress Concern Present   Feeling of Stress : Not at all  Social Connections: Socially Integrated   Frequency of Communication with Friends and Family: More than three times a week   Frequency of Social Gatherings with Friends and Family: More than three times a week  Attends Religious Services: More than 4 times per year   Active Member of Clubs or Organizations: Yes   Attends Archivist Meetings: More than 4 times per year   Marital Status: Married    Tobacco Counseling Counseling given: Not Answered   Clinical Intake:  Pre-visit preparation completed: Yes  Pain : No/denies pain     Nutritional Status: BMI of 19-24  Normal Nutritional Risks: None Diabetes: Yes CBG done?: No Did pt. bring in CBG monitor from home?: No  How often do you need to have someone help you when you read instructions, pamphlets, or other written materials from your doctor or pharmacy?: 1 - Never  Diabetes:  Is the patient diabetic?  Yes  If diabetic, was a CBG obtained today?  No  Did the patient bring in their glucometer from home?  No  How often do you monitor your CBG's? daily.   Financial Strains and Diabetes Management:  Are you having any financial strains with the device, your supplies or your medication? No .  Does the patient want to be seen by Chronic Care Management for management of their diabetes?  No  Would the patient like to be referred to a Nutritionist or for Diabetic Management?  No   Diabetic Exams:  Diabetic Eye Exam: Completed 02/09/2020.  Diabetic Foot Exam: Completed 03/20/2020.    Interpreter Needed?: No  Information entered by :: Caroleen Hamman LPN   Activities of Daily Living In your present state of health, do you have any difficulty performing the following activities: 10/16/2020 09/19/2020  Hearing? N N  Vision? N N  Difficulty  concentrating or making decisions? N N  Walking or climbing stairs? N N  Dressing or bathing? N N  Doing errands, shopping? N N  Preparing Food and eating ? N -  Using the Toilet? N -  In the past six months, have you accidently leaked urine? N -  Do you have problems with loss of bowel control? N -  Managing your Medications? N -  Managing your Finances? N -  Housekeeping or managing your Housekeeping? N -  Some recent data might be hidden    Patient Care Team: Carollee Herter, Alferd Apa, DO as PCP - General Harold Hedge, Darrick Grinder, MD as Consulting Physician (Allergy and Immunology) Odette Fraction as Consulting Physician (Optometry)  Indicate any recent Medical Services you may have received from other than [facility] providers in the past year (date may be approximate).     Assessment:   This is a routine wellness examination for [place].  Hearing/Vision screen Hearing Screening - Comments:: No issues Vision Screening - Comments:: Wears glasses Last eye exam-01/2020-Dr. Joya San  Dietary issues and exercise activities discussed: Current Exercise Habits: Home exercise routine, Type of exercise: strength training/weights;walking, Time (Minutes): 45, Frequency (Times/Week): 7, Weekly Exercise (Minutes/Week): 315, Intensity: Mild, Exercise limited by: None identified   Goals Addressed             This Visit's Progress    Maintain healthy lifestyle.   On track      Depression Screen PHQ 2/9 Scores 10/16/2020 09/19/2020 08/19/2019 08/18/2018 08/15/2017 12/03/2016 11/28/2015  PHQ - 2 Score 0 0 0 0 0 0 0    Fall Risk Fall Risk  10/16/2020 09/19/2020 08/19/2019 08/18/2018 08/15/2017  Falls in the past year? 0 0 0 0 No  Comment - - - - -  Number falls in past yr: 0 0 0 - -  Comment - - - - -  Injury with Fall? 0 0 0 - -  Follow up Falls prevention discussed Falls evaluation completed Education provided;Falls prevention discussed - -    FALL RISK PREVENTION PERTAINING TO THE HOME:  Any  stairs in or around the home? Yes  If so, are there any without handrails? No  Home free of loose throw rugs in walkways, pet beds, electrical cords, etc? Yes  Adequate lighting in your home to reduce risk of falls? Yes   ASSISTIVE DEVICES UTILIZED TO PREVENT FALLS:  Life alert? No  Use of a cane, walker or w/c? No  Grab bars in the bathroom? Yes  Shower chair or bench in shower? No  Elevated toilet seat or a handicapped toilet? No   TIMED UP AND GO:  Was the test performed? Yes .  Length of time to ambulate 10 feet: 10 sec.   Gait steady and fast without use of assistive device  Cognitive Function:Normal cognitive status assessed by direct observation by this Nurse Health Advisor. No abnormalities found.          Immunizations Immunization History  Administered Date(s) Administered   Fluad Quad(high Dose 65+) 12/09/2018   Hep A / Hep B 12/09/2018, 01/12/2019   Hepatitis A 07/30/2019   Hepatitis B 07/30/2019   Influenza Split 02/12/2011   Influenza Whole 02/01/2009   Influenza, High Dose Seasonal PF 02/06/2015, 01/04/2016   Influenza,inj,Quad PF,6+ Mos 12/28/2013   Influenza-Unspecified 01/23/2018, 12/30/2019   PFIZER(Purple Top)SARS-COV-2 Vaccination 06/14/2019, 07/05/2019, 01/27/2020   PNEUMOCOCCAL CONJUGATE-20 06/29/2020   Pneumococcal Conjugate-13 06/24/2013, 12/09/2018   Pneumococcal Polysaccharide-23 12/29/2007, 12/13/2008, 09/13/2019   Td 09/16/2001   Tdap 06/28/2013   Zoster Recombinat (Shingrix) 11/12/2017, 01/23/2018   Zoster, Live 11/12/2007    TDAP status: Up to date  Flu Vaccine status: Up to date  Pneumococcal vaccine status: Up to date  Covid-19 vaccine status: Completed vaccines  Qualifies for Shingles Vaccine? No   Zostavax completed Yes   Shingrix Completed?: Yes  Screening Tests Health Maintenance  Topic Date Due   COLONOSCOPY (Pts 45-63yrs Insurance coverage will need to be confirmed)  08/08/2020   INFLUENZA VACCINE  11/20/2020    OPHTHALMOLOGY EXAM  02/08/2021   FOOT EXAM  03/20/2021   HEMOGLOBIN A1C  03/21/2021   DEXA SCAN  05/02/2021   MAMMOGRAM  05/11/2021   TETANUS/TDAP  06/29/2023   COVID-19 Vaccine  Completed   Hepatitis C Screening  Completed   PNA vac Low Risk Adult  Completed   Zoster Vaccines- Shingrix  Completed   HPV VACCINES  Aged Out    Health Maintenance  Health Maintenance Due  Topic Date Due   COLONOSCOPY (Pts 45-37yrs Insurance coverage will need to be confirmed)  08/08/2020    Colorectal cancer screening: No longer required.   Mammogram status: Completed Bilateral 05/11/2020. Repeat every year  Bone Density status: Completed 05/03/2019. Results reflect: Bone density results: OSTEOPENIA. Repeat every 2 years.  Lung Cancer Screening: (Low Dose CT Chest recommended if Age 44-80 years, 30 pack-year currently smoking OR have quit w/in 15years.) does not qualify.    Additional Screening:  Hepatitis C Screening:  Completed 03/02/2015  Vision Screening: Recommended annual ophthalmology exams for early detection of glaucoma and other disorders of the eye. Is the patient up to date with their annual eye exam?  Yes  Who is the provider or what is the name of the office in which the patient attends annual eye exams? Dr. Joya San   Dental Screening: Recommended annual dental exams for proper  oral hygiene  Community Resource Referral / Chronic Care Management: CRR required this visit?  No   CCM required this visit?  No      Plan:     I have personally reviewed and noted the following in the patient's chart:   Medical and social history Use of alcohol, tobacco or illicit drugs  Current medications and supplements including opioid prescriptions.  Functional ability and status Nutritional status Physical activity Advanced directives List of other physicians Hospitalizations, surgeries, and ER visits in previous 12 months Vitals Screenings to include cognitive, depression, and  falls Referrals and appointments  In addition, I have reviewed and discussed with patient certain preventive protocols, quality metrics, and best practice recommendations. A written personalized care plan for preventive services as well as general preventive health recommendations were provided to patient.     Marta Antu, LPN   0/07/[id number]  Nurse Health Advisor  Nurse Notes: None

## 2020-10-16 ENCOUNTER — Other Ambulatory Visit: Payer: Self-pay

## 2020-10-16 ENCOUNTER — Ambulatory Visit (INDEPENDENT_AMBULATORY_CARE_PROVIDER_SITE_OTHER): Payer: Medicare Other

## 2020-10-16 VITALS — BP 158/80 | HR 99 | Temp 97.4°F | Resp 16 | Ht 62.0 in | Wt 132.4 lb

## 2020-10-16 DIAGNOSIS — Z0000 Encounter for general adult medical examination without abnormal findings: Secondary | ICD-10-CM

## 2020-10-16 DIAGNOSIS — J301 Allergic rhinitis due to pollen: Secondary | ICD-10-CM | POA: Diagnosis not present

## 2020-10-16 DIAGNOSIS — J3081 Allergic rhinitis due to animal (cat) (dog) hair and dander: Secondary | ICD-10-CM | POA: Diagnosis not present

## 2020-10-16 DIAGNOSIS — J3089 Other allergic rhinitis: Secondary | ICD-10-CM | POA: Diagnosis not present

## 2020-10-16 NOTE — Patient Instructions (Signed)
Cheryl Cisneros , Thank you for taking time to come for your Medicare Wellness Visit. I appreciate your ongoing commitment to your health goals. Please review the following plan we discussed and let me know if I can assist you in the future.   Screening recommendations/referrals: Colonoscopy:  No longer required Mammogram: Completed 05/11/2020-Due 05/11/2021 Bone Density: Completed 05/03/2019-Due 05/02/2021 Recommended yearly ophthalmology/optometry visit for glaucoma screening and checkup Recommended yearly dental visit for hygiene and checkup  Vaccinations: Influenza vaccine: Up to date Pneumococcal vaccine: Up to date Tdap vaccine: Up to date-Due 06/2023 Shingles vaccine: Completed vaccines   Covid-19:Up to date  Advanced directives: Please bring a copy for your chart  Conditions/risks identified: See problem list  Next appointment: Follow up in one year for your annual wellness visit 10/22/2021 @ 9:00   Preventive Care 74 Years and Older, Female Preventive care refers to lifestyle choices and visits with your health care provider that can promote health and wellness. What does preventive care include? A yearly physical exam. This is also called an annual well check. Dental exams once or twice a year. Routine eye exams. Ask your health care provider how often you should have your eyes checked. Personal lifestyle choices, including: Daily care of your teeth and gums. Regular physical activity. Eating a healthy diet. Avoiding tobacco and drug use. Limiting alcohol use. Practicing safe sex. Taking low-dose aspirin every day. Taking vitamin and mineral supplements as recommended by your health care provider. What happens during an annual well check? The services and screenings done by your health care provider during your annual well check will depend on your age, overall health, lifestyle risk factors, and family history of disease. Counseling  Your health care provider may ask you  questions about your: Alcohol use. Tobacco use. Drug use. Emotional well-being. Home and relationship well-being. Sexual activity. Eating habits. History of falls. Memory and ability to understand (cognition). Work and work Statistician. Reproductive health. Screening  You may have the following tests or measurements: Height, weight, and BMI. Blood pressure. Lipid and cholesterol levels. These may be checked every 5 years, or more frequently if you are over 38 years old. Skin check. Lung cancer screening. You may have this screening every year starting at age 73 if you have a 30-pack-year history of smoking and currently smoke or have quit within the past 15 years. Fecal occult blood test (FOBT) of the stool. You may have this test every year starting at age 70. Flexible sigmoidoscopy or colonoscopy. You may have a sigmoidoscopy every 5 years or a colonoscopy every 10 years starting at age 80. Hepatitis C blood test. Hepatitis B blood test. Sexually transmitted disease (STD) testing. Diabetes screening. This is done by checking your blood sugar (glucose) after you have not eaten for a while (fasting). You may have this done every 1-3 years. Bone density scan. This is done to screen for osteoporosis. You may have this done starting at age 57. Mammogram. This may be done every 1-2 years. Talk to your health care provider about how often you should have regular mammograms. Talk with your health care provider about your test results, treatment options, and if necessary, the need for more tests. Vaccines  Your health care provider may recommend certain vaccines, such as: Influenza vaccine. This is recommended every year. Tetanus, diphtheria, and acellular pertussis (Tdap, Td) vaccine. You may need a Td booster every 10 years. Zoster vaccine. You may need this after age 78. Pneumococcal 13-valent conjugate (PCV13) vaccine. One dose is  recommended after age 27. Pneumococcal polysaccharide  (PPSV23) vaccine. One dose is recommended after age 24. Talk to your health care provider about which screenings and vaccines you need and how often you need them. This information is not intended to replace advice given to you by your health care provider. Make sure you discuss any questions you have with your health care provider. Document Released: 05/05/2015 Document Revised: 12/27/2015 Document Reviewed: 02/07/2015 Elsevier Interactive Patient Education  2017 [employer] Prevention in the Home Falls can cause injuries. They can happen to people of all ages. There are many things you can do to make your home safe and to help prevent falls. What can I do on the outside of my home? Regularly fix the edges of walkways and driveways and fix any cracks. Remove anything that might make you trip as you walk through a door, such as a raised step or threshold. Trim any bushes or trees on the path to your home. Use bright outdoor lighting. Clear any walking paths of anything that might make someone trip, such as rocks or tools. Regularly check to see if handrails are loose or broken. Make sure that both sides of any steps have handrails. Any raised decks and porches should have guardrails on the edges. Have any leaves, snow, or ice cleared regularly. Use sand or salt on walking paths during winter. Clean up any spills in your garage right away. This includes oil or grease spills. What can I do in the bathroom? Use night lights. Install grab bars by the toilet and in the tub and shower. Do not use towel bars as grab bars. Use non-skid mats or decals in the tub or shower. If you need to sit down in the shower, use a plastic, non-slip stool. Keep the floor dry. Clean up any water that spills on the floor as soon as it happens. Remove soap buildup in the tub or shower regularly. Attach bath mats securely with double-sided non-slip rug tape. Do not have throw rugs and other things on the  floor that can make you trip. What can I do in the bedroom? Use night lights. Make sure that you have a light by your bed that is easy to reach. Do not use any sheets or blankets that are too big for your bed. They should not hang down onto the floor. Have a firm chair that has side arms. You can use this for support while you get dressed. Do not have throw rugs and other things on the floor that can make you trip. What can I do in the kitchen? Clean up any spills right away. Avoid walking on wet floors. Keep items that you use a lot in easy-to-reach places. If you need to reach something above you, use a strong step stool that has a grab bar. Keep electrical cords out of the way. Do not use floor polish or wax that makes floors slippery. If you must use wax, use non-skid floor wax. Do not have throw rugs and other things on the floor that can make you trip. What can I do with my stairs? Do not leave any items on the stairs. Make sure that there are handrails on both sides of the stairs and use them. Fix handrails that are broken or loose. Make sure that handrails are as long as the stairways. Check any carpeting to make sure that it is firmly attached to the stairs. Fix any carpet that is loose or worn. Avoid having  throw rugs at the top or bottom of the stairs. If you do have throw rugs, attach them to the floor with carpet tape. Make sure that you have a light switch at the top of the stairs and the bottom of the stairs. If you do not have them, ask someone to add them for you. What else can I do to help prevent falls? Wear shoes that: Do not have high heels. Have rubber bottoms. Are comfortable and fit you well. Are closed at the toe. Do not wear sandals. If you use a stepladder: Make sure that it is fully opened. Do not climb a closed stepladder. Make sure that both sides of the stepladder are locked into place. Ask someone to hold it for you, if possible. Clearly mark and make  sure that you can see: Any grab bars or handrails. First and last steps. Where the edge of each step is. Use tools that help you move around (mobility aids) if they are needed. These include: Canes. Walkers. Scooters. Crutches. Turn on the lights when you go into a dark area. Replace any light bulbs as soon as they burn out. Set up your furniture so you have a clear path. Avoid moving your furniture around. If any of your floors are uneven, fix them. If there are any pets around you, be aware of where they are. Review your medicines with your doctor. Some medicines can make you feel dizzy. This can increase your chance of falling. Ask your doctor what other things that you can do to help prevent falls. This information is not intended to replace advice given to you by your health care provider. Make sure you discuss any questions you have with your health care provider. Document Released: 02/02/2009 Document Revised: 09/14/2015 Document Reviewed: 05/13/2014 Elsevier Interactive Patient Education  2017 [employer].

## 2020-10-18 DIAGNOSIS — N183 Chronic kidney disease, stage 3 unspecified: Secondary | ICD-10-CM | POA: Diagnosis not present

## 2020-10-18 DIAGNOSIS — E1122 Type 2 diabetes mellitus with diabetic chronic kidney disease: Secondary | ICD-10-CM | POA: Diagnosis not present

## 2020-10-18 DIAGNOSIS — I129 Hypertensive chronic kidney disease with stage 1 through stage 4 chronic kidney disease, or unspecified chronic kidney disease: Secondary | ICD-10-CM | POA: Diagnosis not present

## 2020-10-18 DIAGNOSIS — D649 Anemia, unspecified: Secondary | ICD-10-CM | POA: Diagnosis not present

## 2020-11-13 DIAGNOSIS — J3089 Other allergic rhinitis: Secondary | ICD-10-CM | POA: Diagnosis not present

## 2020-11-13 DIAGNOSIS — N183 Chronic kidney disease, stage 3 unspecified: Secondary | ICD-10-CM | POA: Diagnosis not present

## 2020-11-13 DIAGNOSIS — J3081 Allergic rhinitis due to animal (cat) (dog) hair and dander: Secondary | ICD-10-CM | POA: Diagnosis not present

## 2020-11-13 DIAGNOSIS — J301 Allergic rhinitis due to pollen: Secondary | ICD-10-CM | POA: Diagnosis not present

## 2020-12-02 ENCOUNTER — Other Ambulatory Visit: Payer: Self-pay | Admitting: Family Medicine

## 2020-12-02 DIAGNOSIS — E1151 Type 2 diabetes mellitus with diabetic peripheral angiopathy without gangrene: Secondary | ICD-10-CM

## 2020-12-02 DIAGNOSIS — I10 Essential (primary) hypertension: Secondary | ICD-10-CM

## 2020-12-02 DIAGNOSIS — E785 Hyperlipidemia, unspecified: Secondary | ICD-10-CM

## 2020-12-13 ENCOUNTER — Ambulatory Visit (AMBULATORY_SURGERY_CENTER): Payer: Self-pay | Admitting: *Deleted

## 2020-12-13 ENCOUNTER — Other Ambulatory Visit: Payer: Self-pay

## 2020-12-13 ENCOUNTER — Telehealth: Payer: Self-pay | Admitting: *Deleted

## 2020-12-13 VITALS — Ht 62.0 in | Wt 129.0 lb

## 2020-12-13 DIAGNOSIS — Z86010 Personal history of colonic polyps: Secondary | ICD-10-CM

## 2020-12-13 NOTE — Telephone Encounter (Signed)
LMOM to call office back.

## 2020-12-13 NOTE — Telephone Encounter (Signed)
LMOM for pt to call back regarding Dr. Lynne Leader recommendations

## 2020-12-13 NOTE — Telephone Encounter (Signed)
Spoke with pt and gave her Dr. Lynne Leader recommendations.  She is having no GI issues now and will call in the future if she develops any.  Colonoscopy for 12-27-20 cancelled

## 2020-12-13 NOTE — Telephone Encounter (Signed)
Her colonoscopy in 2017 had one polyp which was not precancerous so we did not recommend another colonoscopy for screening, surveillance. If she has signs, symptoms that might need GI evaluation she should schedule an office appt with me or an APP.

## 2020-12-13 NOTE — Progress Notes (Signed)
  Pt does has PONV, denies being told they were difficult to intubate, or hx/fam hx of malignant hyperthermia per pt   No egg or soy allergy  No home oxygen use   No medications for weight loss taken   Pt denies constipation issues  Plenvu sample given

## 2020-12-13 NOTE — Telephone Encounter (Signed)
Dr. Fuller Plan,  This pt is scheduled for a recall colonoscopy on 12-27-20.  I did not see a recall assessment sheet in her chart and she hasn't had an office visit within the past year. She had a colonoscopy in 2017.  She is 75 years old.  Is she ok to proceed as scheduled?  Thanks.  Cyril Mourning

## 2020-12-18 DIAGNOSIS — J3089 Other allergic rhinitis: Secondary | ICD-10-CM | POA: Diagnosis not present

## 2020-12-18 DIAGNOSIS — J3081 Allergic rhinitis due to animal (cat) (dog) hair and dander: Secondary | ICD-10-CM | POA: Diagnosis not present

## 2020-12-18 DIAGNOSIS — J301 Allergic rhinitis due to pollen: Secondary | ICD-10-CM | POA: Diagnosis not present

## 2020-12-21 DIAGNOSIS — N183 Chronic kidney disease, stage 3 unspecified: Secondary | ICD-10-CM | POA: Diagnosis not present

## 2020-12-26 ENCOUNTER — Other Ambulatory Visit (INDEPENDENT_AMBULATORY_CARE_PROVIDER_SITE_OTHER): Payer: Medicare Other

## 2020-12-26 ENCOUNTER — Other Ambulatory Visit: Payer: Self-pay

## 2020-12-26 DIAGNOSIS — E1165 Type 2 diabetes mellitus with hyperglycemia: Secondary | ICD-10-CM

## 2020-12-26 LAB — LIPID PANEL
Cholesterol: 137 mg/dL (ref 0–200)
HDL: 45.1 mg/dL (ref >39.00)
LDL Cholesterol: 52 mg/dL (ref 0–99)
NonHDL: 91.9
Total CHOL/HDL Ratio: 3
Triglycerides: 200 mg/dL — ABNORMAL HIGH (ref 0.0–149.0)
VLDL: 40 mg/dL (ref 0.0–40.0)

## 2020-12-26 LAB — HEMOGLOBIN A1C: Hgb A1c MFr Bld: 6.8 % — ABNORMAL HIGH (ref 4.6–6.5)

## 2020-12-26 LAB — COMPREHENSIVE METABOLIC PANEL
ALT: 19 U/L (ref 0–35)
AST: 14 U/L (ref 0–37)
Albumin: 4.5 g/dL (ref 3.5–5.2)
Alkaline Phosphatase: 25 U/L — ABNORMAL LOW (ref 39–117)
BUN: 35 mg/dL — ABNORMAL HIGH (ref 6–23)
CO2: 27 mEq/L (ref 19–32)
Calcium: 9.7 mg/dL (ref 8.4–10.5)
Chloride: 99 mEq/L (ref 96–112)
Creatinine, Ser: 1.61 mg/dL — ABNORMAL HIGH (ref 0.40–1.20)
GFR: 30.75 mL/min — ABNORMAL LOW (ref >60.00)
Glucose, Bld: 153 mg/dL — ABNORMAL HIGH (ref 70–99)
Potassium: 4.4 mEq/L (ref 3.5–5.1)
Sodium: 138 mEq/L (ref 135–145)
Total Bilirubin: 0.4 mg/dL (ref 0.2–1.2)
Total Protein: 7.4 g/dL (ref 6.0–8.3)

## 2020-12-27 ENCOUNTER — Encounter: Payer: Medicare Other | Admitting: Gastroenterology

## 2021-01-01 ENCOUNTER — Other Ambulatory Visit: Payer: Self-pay | Admitting: Family Medicine

## 2021-01-02 DIAGNOSIS — J3081 Allergic rhinitis due to animal (cat) (dog) hair and dander: Secondary | ICD-10-CM | POA: Diagnosis not present

## 2021-01-02 DIAGNOSIS — J3089 Other allergic rhinitis: Secondary | ICD-10-CM | POA: Diagnosis not present

## 2021-01-12 DIAGNOSIS — J3089 Other allergic rhinitis: Secondary | ICD-10-CM | POA: Diagnosis not present

## 2021-01-12 DIAGNOSIS — J301 Allergic rhinitis due to pollen: Secondary | ICD-10-CM | POA: Diagnosis not present

## 2021-01-12 DIAGNOSIS — J3081 Allergic rhinitis due to animal (cat) (dog) hair and dander: Secondary | ICD-10-CM | POA: Diagnosis not present

## 2021-01-15 DIAGNOSIS — J3081 Allergic rhinitis due to animal (cat) (dog) hair and dander: Secondary | ICD-10-CM | POA: Diagnosis not present

## 2021-01-15 DIAGNOSIS — N1831 Chronic kidney disease, stage 3a: Secondary | ICD-10-CM | POA: Diagnosis not present

## 2021-01-15 DIAGNOSIS — J3089 Other allergic rhinitis: Secondary | ICD-10-CM | POA: Diagnosis not present

## 2021-01-19 DIAGNOSIS — J3089 Other allergic rhinitis: Secondary | ICD-10-CM | POA: Diagnosis not present

## 2021-01-19 DIAGNOSIS — J3081 Allergic rhinitis due to animal (cat) (dog) hair and dander: Secondary | ICD-10-CM | POA: Diagnosis not present

## 2021-01-23 DIAGNOSIS — J301 Allergic rhinitis due to pollen: Secondary | ICD-10-CM | POA: Diagnosis not present

## 2021-01-23 DIAGNOSIS — J3081 Allergic rhinitis due to animal (cat) (dog) hair and dander: Secondary | ICD-10-CM | POA: Diagnosis not present

## 2021-01-23 DIAGNOSIS — J3089 Other allergic rhinitis: Secondary | ICD-10-CM | POA: Diagnosis not present

## 2021-01-25 DIAGNOSIS — D649 Anemia, unspecified: Secondary | ICD-10-CM | POA: Diagnosis not present

## 2021-01-25 DIAGNOSIS — E1122 Type 2 diabetes mellitus with diabetic chronic kidney disease: Secondary | ICD-10-CM | POA: Diagnosis not present

## 2021-01-25 DIAGNOSIS — N183 Chronic kidney disease, stage 3 unspecified: Secondary | ICD-10-CM | POA: Diagnosis not present

## 2021-01-25 DIAGNOSIS — J3089 Other allergic rhinitis: Secondary | ICD-10-CM | POA: Diagnosis not present

## 2021-01-25 DIAGNOSIS — J3081 Allergic rhinitis due to animal (cat) (dog) hair and dander: Secondary | ICD-10-CM | POA: Diagnosis not present

## 2021-01-25 DIAGNOSIS — I129 Hypertensive chronic kidney disease with stage 1 through stage 4 chronic kidney disease, or unspecified chronic kidney disease: Secondary | ICD-10-CM | POA: Diagnosis not present

## 2021-02-09 DIAGNOSIS — J3081 Allergic rhinitis due to animal (cat) (dog) hair and dander: Secondary | ICD-10-CM | POA: Diagnosis not present

## 2021-02-09 DIAGNOSIS — J301 Allergic rhinitis due to pollen: Secondary | ICD-10-CM | POA: Diagnosis not present

## 2021-02-09 DIAGNOSIS — J3089 Other allergic rhinitis: Secondary | ICD-10-CM | POA: Diagnosis not present

## 2021-02-22 DIAGNOSIS — J3081 Allergic rhinitis due to animal (cat) (dog) hair and dander: Secondary | ICD-10-CM | POA: Diagnosis not present

## 2021-02-22 DIAGNOSIS — J4520 Mild intermittent asthma, uncomplicated: Secondary | ICD-10-CM | POA: Diagnosis not present

## 2021-02-22 DIAGNOSIS — J3089 Other allergic rhinitis: Secondary | ICD-10-CM | POA: Diagnosis not present

## 2021-02-22 DIAGNOSIS — J301 Allergic rhinitis due to pollen: Secondary | ICD-10-CM | POA: Diagnosis not present

## 2021-03-02 ENCOUNTER — Other Ambulatory Visit: Payer: Self-pay | Admitting: Family Medicine

## 2021-03-02 DIAGNOSIS — I10 Essential (primary) hypertension: Secondary | ICD-10-CM

## 2021-03-02 DIAGNOSIS — K219 Gastro-esophageal reflux disease without esophagitis: Secondary | ICD-10-CM

## 2021-03-02 DIAGNOSIS — E1165 Type 2 diabetes mellitus with hyperglycemia: Secondary | ICD-10-CM

## 2021-03-02 DIAGNOSIS — M8589 Other specified disorders of bone density and structure, multiple sites: Secondary | ICD-10-CM

## 2021-03-02 MED ORDER — ALENDRONATE SODIUM 70 MG PO TABS: ORAL_TABLET | ORAL | 3 refills | Status: DC

## 2021-03-19 DIAGNOSIS — J3081 Allergic rhinitis due to animal (cat) (dog) hair and dander: Secondary | ICD-10-CM | POA: Diagnosis not present

## 2021-03-19 DIAGNOSIS — J3089 Other allergic rhinitis: Secondary | ICD-10-CM | POA: Diagnosis not present

## 2021-03-19 DIAGNOSIS — J301 Allergic rhinitis due to pollen: Secondary | ICD-10-CM | POA: Diagnosis not present

## 2021-03-22 ENCOUNTER — Ambulatory Visit: Payer: Medicare Other | Admitting: Family Medicine

## 2021-03-23 ENCOUNTER — Ambulatory Visit: Payer: Medicare Other | Admitting: Family Medicine

## 2021-03-24 ENCOUNTER — Other Ambulatory Visit: Payer: Self-pay | Admitting: Family Medicine

## 2021-03-24 DIAGNOSIS — E039 Hypothyroidism, unspecified: Secondary | ICD-10-CM

## 2021-03-26 ENCOUNTER — Ambulatory Visit (HOSPITAL_BASED_OUTPATIENT_CLINIC_OR_DEPARTMENT_OTHER)
Admission: RE | Admit: 2021-03-26 | Discharge: 2021-03-26 | Disposition: A | Discharge status: Home / Self Care | Payer: Medicare Other | Source: Ambulatory Visit | Attending: Family Medicine | Admitting: Family Medicine

## 2021-03-26 ENCOUNTER — Encounter: Payer: Self-pay | Admitting: Family Medicine

## 2021-03-26 ENCOUNTER — Other Ambulatory Visit: Payer: Self-pay

## 2021-03-26 ENCOUNTER — Ambulatory Visit (INDEPENDENT_AMBULATORY_CARE_PROVIDER_SITE_OTHER): Payer: Medicare Other | Admitting: Family Medicine

## 2021-03-26 VITALS — BP 142/72 | HR 73 | Temp 98.5°F | Resp 18 | Ht 62.0 in | Wt 135.4 lb

## 2021-03-26 DIAGNOSIS — G8929 Other chronic pain: Secondary | ICD-10-CM | POA: Diagnosis not present

## 2021-03-26 DIAGNOSIS — E1169 Type 2 diabetes mellitus with other specified complication: Secondary | ICD-10-CM

## 2021-03-26 DIAGNOSIS — E1165 Type 2 diabetes mellitus with hyperglycemia: Secondary | ICD-10-CM | POA: Diagnosis not present

## 2021-03-26 DIAGNOSIS — R252 Cramp and spasm: Secondary | ICD-10-CM | POA: Diagnosis not present

## 2021-03-26 DIAGNOSIS — I10 Essential (primary) hypertension: Secondary | ICD-10-CM

## 2021-03-26 DIAGNOSIS — M25511 Pain in right shoulder: Secondary | ICD-10-CM

## 2021-03-26 DIAGNOSIS — E039 Hypothyroidism, unspecified: Secondary | ICD-10-CM | POA: Diagnosis not present

## 2021-03-26 DIAGNOSIS — E785 Hyperlipidemia, unspecified: Secondary | ICD-10-CM

## 2021-03-26 LAB — CBC WITH DIFFERENTIAL/PLATELET
Basophils Absolute: 0 10*3/uL (ref 0.0–0.1)
Basophils Relative: 0.7 % (ref 0.0–3.0)
Eosinophils Absolute: 0.1 10*3/uL (ref 0.0–0.7)
Eosinophils Relative: 1.8 % (ref 0.0–5.0)
HCT: 35 % — ABNORMAL LOW (ref 36.0–46.0)
Hemoglobin: 11.7 g/dL — ABNORMAL LOW (ref 12.0–15.0)
Lymphocytes Relative: 34.2 % (ref 12.0–46.0)
Lymphs Abs: 2.4 10*3/uL (ref 0.7–4.0)
MCHC: 33.4 g/dL (ref 30.0–36.0)
MCV: 98.7 fl (ref 78.0–100.0)
Monocytes Absolute: 0.3 10*3/uL (ref 0.1–1.0)
Monocytes Relative: 4.3 % (ref 3.0–12.0)
Neutro Abs: 4.2 10*3/uL (ref 1.4–7.7)
Neutrophils Relative %: 59 % (ref 43.0–77.0)
Platelets: 207 10*3/uL (ref 150.0–400.0)
RBC: 3.55 Mil/uL — ABNORMAL LOW (ref 3.87–5.11)
RDW: 13.2 % (ref 11.5–15.5)
WBC: 7 10*3/uL (ref 4.0–10.5)

## 2021-03-26 LAB — LIPID PANEL
Cholesterol: 134 mg/dL (ref 0–200)
HDL: 42 mg/dL (ref >39.00)
NonHDL: 92.37
Total CHOL/HDL Ratio: 3
Triglycerides: 276 mg/dL — ABNORMAL HIGH (ref 0.0–149.0)
VLDL: 55.2 mg/dL — ABNORMAL HIGH (ref 0.0–40.0)

## 2021-03-26 LAB — COMPREHENSIVE METABOLIC PANEL
ALT: 17 U/L (ref 0–35)
AST: 14 U/L (ref 0–37)
Albumin: 4.7 g/dL (ref 3.5–5.2)
Alkaline Phosphatase: 29 U/L — ABNORMAL LOW (ref 39–117)
BUN: 48 mg/dL — ABNORMAL HIGH (ref 6–23)
CO2: 26 mEq/L (ref 19–32)
Calcium: 10.4 mg/dL (ref 8.4–10.5)
Chloride: 101 mEq/L (ref 96–112)
Creatinine, Ser: 1.71 mg/dL — ABNORMAL HIGH (ref 0.40–1.20)
GFR: 28.55 mL/min — ABNORMAL LOW (ref >60.00)
Glucose, Bld: 133 mg/dL — ABNORMAL HIGH (ref 70–99)
Potassium: 5.2 mEq/L — ABNORMAL HIGH (ref 3.5–5.1)
Sodium: 140 mEq/L (ref 135–145)
Total Bilirubin: 0.3 mg/dL (ref 0.2–1.2)
Total Protein: 7.4 g/dL (ref 6.0–8.3)

## 2021-03-26 LAB — LDL CHOLESTEROL, DIRECT: Direct LDL: 71 mg/dL

## 2021-03-26 LAB — HEMOGLOBIN A1C: Hgb A1c MFr Bld: 7 % — ABNORMAL HIGH (ref 4.6–6.5)

## 2021-03-26 LAB — TSH: TSH: 0.89 u[IU]/mL (ref 0.35–5.50)

## 2021-03-26 NOTE — Assessment & Plan Note (Signed)
Tolerating statin, encouraged heart healthy diet, avoid trans fats, minimize simple carbs and saturated fats. Increase exercise as tolerated 

## 2021-03-26 NOTE — Assessment & Plan Note (Signed)
hgba1c to be checked, minimize simple carbs. Increase exercise as tolerated. Continue current meds  

## 2021-03-26 NOTE — Progress Notes (Signed)
Established Patient Office Visit  Subjective:  Patient ID: Cheryl Cisneros, female    DOB: 03/13/1946  Age: 75 y.o. MRN: [id number]  CC:  Chief Complaint  Patient presents with   Hypertension   Hyperlipidemia   Diabetes   Follow-up    HPI Cheryl Cisneros presents for f/u bp , chol and dm.   She c/o R shoulder pain -- she has had surgery on it before She also c/o pain in her calf b/l but it is better today.  No pain ---- not associated with sob or chest pain.    Past Medical History:  Diagnosis Date   Allergy    Arthritis    Asthma    Borderline glaucoma    Cataract    Chronic kidney disease    kidney stones   Diabetes ([place])    GERD (gastroesophageal reflux disease)    Hypertension    Hypothyroidism 10/27/2006   Qualifier: Diagnosis of  By: Jerold Coombe     Macular degeneration 12/25/2011   Osteopenia    Thyroid disease    Hypothyroidism    Past Surgical History:  Procedure Laterality Date   ABDOMINAL HYSTERECTOMY  1986   TAH BSO   BACK SURGERY  2016   x3 total- 1999, 2016, 2017   bladder surgery     repair   CATARACT EXTRACTION Bilateral    COLONOSCOPY     EYE SURGERY Right 07/28/2017   Dr.Beavis. Cataract removal   KNEE ARTHROSCOPY Right 2002   LITHOTRIPSY     2004   LUMBAR LAMINECTOMY  1999   ROTATOR CUFF REPAIR Right 02/27/2005   TONSILLECTOMY  1963    Family History  Problem Relation Age of Onset   Diabetes Mother    Hypertension Mother    Kidney disease Mother        dialysis   Diabetes Father    Hypertension Father    Cancer Father        lung   Dementia Sister    Diabetes Sister    Hyperlipidemia Sister    Hypertension Sister    Diabetes Sister    Hypertension Sister    Hyperlipidemia Sister    Other Sister        blood disorder   Diabetes Brother    Hyperlipidemia Brother    Hypertension Brother    Cervical cancer Daughter    Cancer Daughter 43       cervical   Stomach cancer Other    Rectal cancer Other    Coronary  artery disease Other    Lung cancer Other    Colon cancer Neg Hx    Esophageal cancer Neg Hx     Social History   Socioeconomic History   Marital status: Married    Spouse name: Not on file   Number of children: Not on file   Years of education: Not on file   Highest education level: Not on file  Occupational History   Occupation: housewife    Employer: UNEMPLOYED  Tobacco Use   Smoking status: Never   Smokeless tobacco: Never  Vaping Use   Vaping Use: Never used  Substance and Sexual Activity   Alcohol use: No    Alcohol/week: 0.0 standard drinks   Drug use: No   Sexual activity: Not Currently    Partners: Male  Other Topics Concern   Not on file  Social History Narrative   Exercise--  bootcamp 3 days a week and walking  Social Determinants of Health   Financial Resource Strain: Low Risk    Difficulty of Paying Living Expenses: Not hard at all  Food Insecurity: No Food Insecurity   Worried About Charity fundraiser in the Last Year: Never true   [place] in the Last Year: Never true  Transportation Needs: No Transportation Needs   Lack of Transportation (Medical): No   Lack of Transportation (Non-Medical): No  Physical Activity: Sufficiently Active   Days of Exercise per Week: 7 days   Minutes of Exercise per Session: 40 min  Stress: No Stress Concern Present   Feeling of Stress : Not at all  Social Connections: Socially Integrated   Frequency of Communication with Friends and Family: More than three times a week   Frequency of Social Gatherings with Friends and Family: More than three times a week   Attends Religious Services: More than 4 times per year   Active Member of [employer] or Organizations: Yes   Attends Music therapist: More than 4 times per year   Marital Status: Married  Human resources officer Violence: Not At Risk   Fear of Current or Ex-Partner: No   Emotionally Abused: No   Physically Abused: No   Sexually Abused: No     Outpatient Medications Prior to Visit  Medication Sig Dispense Refill   alendronate (FOSAMAX) 70 MG tablet TAKE ONE TABLET BY MOUTH ONCE A WEEK IN THE MORNING WITH A FULL GLASS OF WATER, 30 MINUTES BEFORE A MEAL OR BEVERAGE. REMAIN UPRIGHT 12 tablet 3   aspirin EC 81 MG tablet Take 1 tablet (81 mg total) by mouth daily.     Biotin 1000 MCG tablet Take 5,000 mcg by mouth 2 (two) times daily.      Calcium-Vitamin D 600-200 MG-UNIT per tablet Take 1 tablet by mouth 3 (three) times daily with meals.     cetirizine (ZYRTEC) 10 MG tablet Take 1 tablet by mouth once daily 90 tablet 1   Cholecalciferol (VITAMIN D3) 1000 UNITS CAPS Take 1 capsule by mouth daily.     CINNAMON PO Take by mouth. 500 mg- 3 per day with meals     CRANBERRY PO Take by mouth. 500 mg daily     EPIPEN 2-PAK 0.3 MG/0.3ML DEVI Reported on 07/26/2015     Ferrous Gluconate (IRON 27 PO) Take by mouth daily.     Garlic Oil 5573 MG CAPS Take 1 capsule by mouth daily.     glucose blood (ONETOUCH ULTRA) test strip USE  STRIP TO CHECK GLUCOSE TWICE DAILY 200 each 12   JARDIANCE 10 MG TABS tablet Take 10 mg by mouth daily.     L-Lysine 500 MG TABS Take 1 tablet by mouth daily.     Lancets (ONETOUCH DELICA PLUS [id number]) MISC Check blood sugar twice daily 200 each 12   levothyroxine (SYNTHROID) 100 MCG tablet Take 1 tablet by mouth once daily 90 tablet 1   Magnesium 250 MG TABS Take 1 tablet by mouth daily.     metFORMIN (GLUCOPHAGE) 1000 MG tablet TAKE 1 TABLET BY MOUTH TWICE DAILY WITH MEALS 180 tablet 1   nateglinide (STARLIX) 120 MG tablet TAKE 1 TABLET BY MOUTH THREE TIMES DAILY WITH MEALS 270 tablet 0   NONFORMULARY OR COMPOUNDED ITEM viviscal advanced hair health  1 po bid 30 each 5   olmesartan-hydrochlorothiazide (BENICAR HCT) 40-12.5 MG tablet Take 1 tablet by mouth once daily 90 tablet 0   Omega-3 Fatty Acids (OMEGA  3 PO) Take 2 tablets by mouth daily.     omeprazole (PRILOSEC) 40 MG capsule Take 1 capsule by mouth once  daily 90 capsule 0   OVER THE COUNTER MEDICATION Maxi-vision 2 capsules per day     pravastatin (PRAVACHOL) 10 MG tablet Take 1 tablet by mouth once daily 90 tablet 1   SELENIUM PO Take by mouth. 200 mcg daily     SIMPLY SALINE NA Place 1 spray into the nose daily.     Specialty Vitamins Products (HAIR NOURISHING SUPPLEMENT PO) Take by mouth. Per patient twice a day after meals.     Turmeric 500 MG CAPS Take 1 capsule by mouth 3 (three) times daily.      vitamin C (ASCORBIC ACID) 500 MG tablet Take 1,000 mg by mouth daily.     No facility-administered medications prior to visit.    Allergies  Allergen Reactions   Losartan Potassium Other (See Comments)    Hair loss   Metoprolol     Hair loss   Norvasc [Amlodipine Besylate] Other (See Comments)    HAIR LOSS    ROS Review of Systems  Constitutional:  Negative for appetite change, diaphoresis, fatigue and unexpected weight change.  Eyes:  Negative for pain, redness and visual disturbance.  Respiratory:  Negative for cough, chest tightness, shortness of breath and wheezing.   Cardiovascular:  Negative for chest pain, palpitations and leg swelling.  Endocrine: Negative for cold intolerance, heat intolerance, polydipsia, polyphagia and polyuria.  Genitourinary:  Negative for difficulty urinating, dysuria and frequency.  Musculoskeletal:  Positive for arthralgias. Negative for joint swelling.  Neurological:  Negative for dizziness, light-headedness, numbness and headaches.     Objective:    Physical Exam Vitals and nursing note reviewed.  Constitutional:      Appearance: She is well-developed.  HENT:     Head: Normocephalic and atraumatic.  Eyes:     Conjunctiva/sclera: Conjunctivae normal.  Neck:     Thyroid: No thyromegaly.     Vascular: No carotid bruit or JVD.  Cardiovascular:     Rate and Rhythm: Normal rate and regular rhythm.     Heart sounds: Normal heart sounds. No murmur heard. Pulmonary:     Effort: Pulmonary  effort is normal. No respiratory distress.     Breath sounds: Normal breath sounds. No wheezing or rales.  Chest:     Chest wall: No tenderness.  Musculoskeletal:        General: Tenderness present.     Right shoulder: Tenderness present. Decreased range of motion.     Left shoulder: Normal.     Cervical back: Normal range of motion and neck supple.     Right lower leg: Normal.     Left lower leg: Normal.  Neurological:     Mental Status: She is alert and oriented to person, place, and time.   Diabetic Foot Exam - Simple   Simple Foot Form Visual Inspection No deformities, no ulcerations, no other skin breakdown bilaterally: Yes Sensation Testing Intact to touch and monofilament testing bilaterally: Yes Pulse Check Posterior Tibialis and Dorsalis pulse intact bilaterally: Yes Comments     BP (!) 142/72 (BP Location: Left Arm, Patient Position: Sitting, Cuff Size: Normal)   Pulse 73   Temp 98.5 F (36.9 C) (Oral)   Resp 18   Ht 5\' 2"  (1.575 m)   Wt 135 lb 6.4 oz (61.4 kg)   SpO2 100%   BMI 24.76 kg/m  Wt Readings from Last 3  Encounters:  03/26/21 135 lb 6.4 oz (61.4 kg)  12/13/20 129 lb (58.5 kg)  10/16/20 132 lb 6.4 oz (60.1 kg)     Health Maintenance Due  Topic Date Due   OPHTHALMOLOGY EXAM  02/08/2021    There are no preventive care reminders to display for this patient.  Lab Results  Component Value Date   TSH 1.05 09/19/2020   Lab Results  Component Value Date   WBC 6.4 09/19/2020   HGB 12.2 09/19/2020   HCT 35.5 (L) 09/19/2020   MCV 96.7 09/19/2020   PLT 199.0 09/19/2020   Lab Results  Component Value Date   NA 138 12/26/2020   K 4.4 12/26/2020   CO2 27 12/26/2020   GLUCOSE 153 (H) 12/26/2020   BUN 35 (H) 12/26/2020   CREATININE 1.61 (H) 12/26/2020   BILITOT 0.4 12/26/2020   ALKPHOS 25 (L) 12/26/2020   AST 14 12/26/2020   ALT 19 12/26/2020   PROT 7.4 12/26/2020   ALBUMIN 4.5 12/26/2020   CALCIUM 9.7 12/26/2020   GFR 30.75 (L)  12/26/2020   Lab Results  Component Value Date   CHOL 137 12/26/2020   Lab Results  Component Value Date   HDL 45.10 12/26/2020   Lab Results  Component Value Date   LDLCALC 52 12/26/2020   Lab Results  Component Value Date   TRIG 200.0 (H) 12/26/2020   Lab Results  Component Value Date   CHOLHDL 3 12/26/2020   Lab Results  Component Value Date   HGBA1C 6.8 (H) 12/26/2020      Assessment & Plan:   Problem List Items Addressed This Visit       Unprioritized   Diabetes mellitus, type II ([place]) - Primary    hgba1c to be checked, minimize simple carbs. Increase exercise as tolerated. Continue current meds       Relevant Orders   Hemoglobin A1c   Comprehensive metabolic panel   CBC with Differential/Platelet   Lipid panel   Hyperlipidemia associated with type 2 diabetes mellitus ([place])    Tolerating statin, encouraged heart healthy diet, avoid trans fats, minimize simple carbs and saturated fats. Increase exercise as tolerated      Relevant Orders   Hemoglobin A1c   Comprehensive metabolic panel   CBC with Differential/Platelet   Lipid panel   Hypothyroidism    Check labs con't synthroid       Relevant Orders   TSH   Other Visit Diagnoses     Primary hypertension       Relevant Orders   Hemoglobin A1c   Comprehensive metabolic panel   CBC with Differential/Platelet   Lipid panel   Muscle cramps       Relevant Orders   CK   Chronic right shoulder pain       Relevant Orders   Ambulatory referral to Orthopedic Surgery   DG Shoulder Right       No orders of the defined types were placed in this encounter.   Follow-up: Return in about 6 months (around 09/24/2021), or if symptoms worsen or fail to improve, for annual exam, fasting.    Ann Held, DO

## 2021-03-26 NOTE — Patient Instructions (Signed)

## 2021-03-26 NOTE — Assessment & Plan Note (Signed)
Well controlled, no changes to meds. Encouraged heart healthy diet such as the DASH diet and exercise as tolerated.  °

## 2021-03-26 NOTE — Assessment & Plan Note (Signed)
Check labs con't synthroid 

## 2021-04-03 DIAGNOSIS — M25511 Pain in right shoulder: Secondary | ICD-10-CM | POA: Diagnosis not present

## 2021-04-04 ENCOUNTER — Other Ambulatory Visit: Payer: Self-pay

## 2021-04-04 DIAGNOSIS — E875 Hyperkalemia: Secondary | ICD-10-CM

## 2021-04-10 DIAGNOSIS — J301 Allergic rhinitis due to pollen: Secondary | ICD-10-CM | POA: Diagnosis not present

## 2021-04-10 DIAGNOSIS — J3089 Other allergic rhinitis: Secondary | ICD-10-CM | POA: Diagnosis not present

## 2021-04-10 DIAGNOSIS — J3081 Allergic rhinitis due to animal (cat) (dog) hair and dander: Secondary | ICD-10-CM | POA: Diagnosis not present

## 2021-04-30 DIAGNOSIS — H524 Presbyopia: Secondary | ICD-10-CM | POA: Diagnosis not present

## 2021-04-30 DIAGNOSIS — H353130 Nonexudative age-related macular degeneration, bilateral, stage unspecified: Secondary | ICD-10-CM | POA: Diagnosis not present

## 2021-05-17 DIAGNOSIS — Z780 Asymptomatic menopausal state: Secondary | ICD-10-CM | POA: Diagnosis not present

## 2021-05-17 DIAGNOSIS — Z1231 Encounter for screening mammogram for malignant neoplasm of breast: Secondary | ICD-10-CM | POA: Diagnosis not present

## 2021-05-17 DIAGNOSIS — M85832 Other specified disorders of bone density and structure, left forearm: Secondary | ICD-10-CM | POA: Diagnosis not present

## 2021-05-17 LAB — HM MAMMOGRAPHY

## 2021-05-17 LAB — HM DEXA SCAN

## 2021-05-21 ENCOUNTER — Encounter: Payer: Self-pay | Admitting: Family Medicine

## 2021-05-21 DIAGNOSIS — J3081 Allergic rhinitis due to animal (cat) (dog) hair and dander: Secondary | ICD-10-CM | POA: Diagnosis not present

## 2021-05-21 DIAGNOSIS — J301 Allergic rhinitis due to pollen: Secondary | ICD-10-CM | POA: Diagnosis not present

## 2021-05-21 DIAGNOSIS — J3089 Other allergic rhinitis: Secondary | ICD-10-CM | POA: Diagnosis not present

## 2021-05-21 DIAGNOSIS — H35373 Puckering of macula, bilateral: Secondary | ICD-10-CM | POA: Diagnosis not present

## 2021-05-26 ENCOUNTER — Other Ambulatory Visit: Payer: Self-pay | Admitting: Family Medicine

## 2021-05-26 DIAGNOSIS — I10 Essential (primary) hypertension: Secondary | ICD-10-CM

## 2021-05-26 DIAGNOSIS — E785 Hyperlipidemia, unspecified: Secondary | ICD-10-CM

## 2021-05-26 DIAGNOSIS — K219 Gastro-esophageal reflux disease without esophagitis: Secondary | ICD-10-CM

## 2021-05-26 DIAGNOSIS — E1151 Type 2 diabetes mellitus with diabetic peripheral angiopathy without gangrene: Secondary | ICD-10-CM

## 2021-05-29 DIAGNOSIS — J301 Allergic rhinitis due to pollen: Secondary | ICD-10-CM | POA: Diagnosis not present

## 2021-05-29 DIAGNOSIS — J3089 Other allergic rhinitis: Secondary | ICD-10-CM | POA: Diagnosis not present

## 2021-06-01 DIAGNOSIS — J3089 Other allergic rhinitis: Secondary | ICD-10-CM | POA: Diagnosis not present

## 2021-06-01 DIAGNOSIS — J301 Allergic rhinitis due to pollen: Secondary | ICD-10-CM | POA: Diagnosis not present

## 2021-06-01 DIAGNOSIS — J3081 Allergic rhinitis due to animal (cat) (dog) hair and dander: Secondary | ICD-10-CM | POA: Diagnosis not present

## 2021-06-04 DIAGNOSIS — J301 Allergic rhinitis due to pollen: Secondary | ICD-10-CM | POA: Diagnosis not present

## 2021-06-04 DIAGNOSIS — J3089 Other allergic rhinitis: Secondary | ICD-10-CM | POA: Diagnosis not present

## 2021-06-04 DIAGNOSIS — J3081 Allergic rhinitis due to animal (cat) (dog) hair and dander: Secondary | ICD-10-CM | POA: Diagnosis not present

## 2021-06-08 DIAGNOSIS — J3089 Other allergic rhinitis: Secondary | ICD-10-CM | POA: Diagnosis not present

## 2021-06-08 DIAGNOSIS — J3081 Allergic rhinitis due to animal (cat) (dog) hair and dander: Secondary | ICD-10-CM | POA: Diagnosis not present

## 2021-06-08 DIAGNOSIS — J301 Allergic rhinitis due to pollen: Secondary | ICD-10-CM | POA: Diagnosis not present

## 2021-06-11 DIAGNOSIS — J3081 Allergic rhinitis due to animal (cat) (dog) hair and dander: Secondary | ICD-10-CM | POA: Diagnosis not present

## 2021-06-11 DIAGNOSIS — J3089 Other allergic rhinitis: Secondary | ICD-10-CM | POA: Diagnosis not present

## 2021-06-11 DIAGNOSIS — J301 Allergic rhinitis due to pollen: Secondary | ICD-10-CM | POA: Diagnosis not present

## 2021-06-15 DIAGNOSIS — J301 Allergic rhinitis due to pollen: Secondary | ICD-10-CM | POA: Diagnosis not present

## 2021-06-15 DIAGNOSIS — J3081 Allergic rhinitis due to animal (cat) (dog) hair and dander: Secondary | ICD-10-CM | POA: Diagnosis not present

## 2021-06-15 DIAGNOSIS — J3089 Other allergic rhinitis: Secondary | ICD-10-CM | POA: Diagnosis not present

## 2021-06-16 ENCOUNTER — Other Ambulatory Visit: Payer: Self-pay | Admitting: Family Medicine

## 2021-06-16 DIAGNOSIS — E1165 Type 2 diabetes mellitus with hyperglycemia: Secondary | ICD-10-CM

## 2021-06-18 DIAGNOSIS — N1831 Chronic kidney disease, stage 3a: Secondary | ICD-10-CM | POA: Diagnosis not present

## 2021-06-28 DIAGNOSIS — I129 Hypertensive chronic kidney disease with stage 1 through stage 4 chronic kidney disease, or unspecified chronic kidney disease: Secondary | ICD-10-CM | POA: Diagnosis not present

## 2021-06-28 DIAGNOSIS — E1122 Type 2 diabetes mellitus with diabetic chronic kidney disease: Secondary | ICD-10-CM | POA: Diagnosis not present

## 2021-06-28 DIAGNOSIS — N183 Chronic kidney disease, stage 3 unspecified: Secondary | ICD-10-CM | POA: Diagnosis not present

## 2021-06-28 DIAGNOSIS — D649 Anemia, unspecified: Secondary | ICD-10-CM | POA: Diagnosis not present

## 2021-07-13 DIAGNOSIS — J3089 Other allergic rhinitis: Secondary | ICD-10-CM | POA: Diagnosis not present

## 2021-07-13 DIAGNOSIS — J3081 Allergic rhinitis due to animal (cat) (dog) hair and dander: Secondary | ICD-10-CM | POA: Diagnosis not present

## 2021-07-13 DIAGNOSIS — J301 Allergic rhinitis due to pollen: Secondary | ICD-10-CM | POA: Diagnosis not present

## 2021-07-17 DIAGNOSIS — D225 Melanocytic nevi of trunk: Secondary | ICD-10-CM | POA: Diagnosis not present

## 2021-07-17 DIAGNOSIS — L565 Disseminated superficial actinic porokeratosis (DSAP): Secondary | ICD-10-CM | POA: Diagnosis not present

## 2021-07-17 DIAGNOSIS — L814 Other melanin hyperpigmentation: Secondary | ICD-10-CM | POA: Diagnosis not present

## 2021-07-17 DIAGNOSIS — L570 Actinic keratosis: Secondary | ICD-10-CM | POA: Diagnosis not present

## 2021-08-14 DIAGNOSIS — J3081 Allergic rhinitis due to animal (cat) (dog) hair and dander: Secondary | ICD-10-CM | POA: Diagnosis not present

## 2021-08-14 DIAGNOSIS — J301 Allergic rhinitis due to pollen: Secondary | ICD-10-CM | POA: Diagnosis not present

## 2021-08-14 DIAGNOSIS — J3089 Other allergic rhinitis: Secondary | ICD-10-CM | POA: Diagnosis not present

## 2021-08-18 ENCOUNTER — Other Ambulatory Visit: Payer: Self-pay | Admitting: Family Medicine

## 2021-08-30 DIAGNOSIS — J3089 Other allergic rhinitis: Secondary | ICD-10-CM | POA: Diagnosis not present

## 2021-08-30 DIAGNOSIS — J3081 Allergic rhinitis due to animal (cat) (dog) hair and dander: Secondary | ICD-10-CM | POA: Diagnosis not present

## 2021-09-12 ENCOUNTER — Other Ambulatory Visit: Payer: Self-pay | Admitting: Family Medicine

## 2021-09-12 DIAGNOSIS — I10 Essential (primary) hypertension: Secondary | ICD-10-CM

## 2021-09-12 DIAGNOSIS — E039 Hypothyroidism, unspecified: Secondary | ICD-10-CM

## 2021-09-18 DIAGNOSIS — J301 Allergic rhinitis due to pollen: Secondary | ICD-10-CM | POA: Diagnosis not present

## 2021-09-18 DIAGNOSIS — J3081 Allergic rhinitis due to animal (cat) (dog) hair and dander: Secondary | ICD-10-CM | POA: Diagnosis not present

## 2021-09-18 DIAGNOSIS — J3089 Other allergic rhinitis: Secondary | ICD-10-CM | POA: Diagnosis not present

## 2021-09-19 ENCOUNTER — Other Ambulatory Visit: Payer: Self-pay | Admitting: Family Medicine

## 2021-09-19 DIAGNOSIS — E1165 Type 2 diabetes mellitus with hyperglycemia: Secondary | ICD-10-CM

## 2021-10-16 DIAGNOSIS — L821 Other seborrheic keratosis: Secondary | ICD-10-CM | POA: Diagnosis not present

## 2021-10-16 DIAGNOSIS — J301 Allergic rhinitis due to pollen: Secondary | ICD-10-CM | POA: Diagnosis not present

## 2021-10-16 DIAGNOSIS — J3089 Other allergic rhinitis: Secondary | ICD-10-CM | POA: Diagnosis not present

## 2021-10-16 DIAGNOSIS — J3081 Allergic rhinitis due to animal (cat) (dog) hair and dander: Secondary | ICD-10-CM | POA: Diagnosis not present

## 2021-10-16 DIAGNOSIS — L570 Actinic keratosis: Secondary | ICD-10-CM | POA: Diagnosis not present

## 2021-10-22 ENCOUNTER — Ambulatory Visit (INDEPENDENT_AMBULATORY_CARE_PROVIDER_SITE_OTHER): Payer: Medicare Other

## 2021-10-22 VITALS — BP 130/76 | HR 89 | Temp 99.1°F | Resp 16 | Ht 61.0 in | Wt 125.4 lb

## 2021-10-22 DIAGNOSIS — Z0000 Encounter for general adult medical examination without abnormal findings: Secondary | ICD-10-CM | POA: Diagnosis not present

## 2021-10-22 NOTE — Patient Instructions (Signed)
Cheryl Cisneros , Thank you for taking time to come for your Medicare Wellness Visit. I appreciate your ongoing commitment to your health goals. Please review the following plan we discussed and let me know if I can assist you in the future.   Screening recommendations/referrals: Colonoscopy: no longer needed Mammogram: 05/17/21 due 05/17/22 Bone Density: 05/17/21 due 05/18/23 Recommended yearly ophthalmology/optometry visit for glaucoma screening and checkup Recommended yearly dental visit for hygiene and checkup  Vaccinations: Influenza vaccine: up to date Pneumococcal vaccine: up to date Tdap vaccine: up to date Shingles vaccine: up to date   Covid-19:declined  Advanced directives: yes, not on file  Conditions/risks identified: see problem list   Next appointment: Follow up in one year for your annual wellness visit 09/25/22   Preventive Care 76 Years and Older, Female Preventive care refers to lifestyle choices and visits with your health care provider that can promote health and wellness. What does preventive care include? A yearly physical exam. This is also called an annual well check. Dental exams once or twice a year. Routine eye exams. Ask your health care provider how often you should have your eyes checked. Personal lifestyle choices, including: Daily care of your teeth and gums. Regular physical activity. Eating a healthy diet. Avoiding tobacco and drug use. Limiting alcohol use. Practicing safe sex. Taking low-dose aspirin every day. Taking vitamin and mineral supplements as recommended by your health care provider. What happens during an annual well check? The services and screenings done by your health care provider during your annual well check will depend on your age, overall health, lifestyle risk factors, and family history of disease. Counseling  Your health care provider may ask you questions about your: Alcohol use. Tobacco use. Drug use. Emotional  well-being. Home and relationship well-being. Sexual activity. Eating habits. History of falls. Memory and ability to understand (cognition). Work and work Statistician. Reproductive health. Screening  You may have the following tests or measurements: Height, weight, and BMI. Blood pressure. Lipid and cholesterol levels. These may be checked every 5 years, or more frequently if you are over 38 years old. Skin check. Lung cancer screening. You may have this screening every year starting at age 24 if you have a 30-pack-year history of smoking and currently smoke or have quit within the past 15 years. Fecal occult blood test (FOBT) of the stool. You may have this test every year starting at age 38. Flexible sigmoidoscopy or colonoscopy. You may have a sigmoidoscopy every 5 years or a colonoscopy every 10 years starting at age 6. Hepatitis C blood test. Hepatitis B blood test. Sexually transmitted disease (STD) testing. Diabetes screening. This is done by checking your blood sugar (glucose) after you have not eaten for a while (fasting). You may have this done every 1-3 years. Bone density scan. This is done to screen for osteoporosis. You may have this done starting at age 33. Mammogram. This may be done every 1-2 years. Talk to your health care provider about how often you should have regular mammograms. Talk with your health care provider about your test results, treatment options, and if necessary, the need for more tests. Vaccines  Your health care provider may recommend certain vaccines, such as: Influenza vaccine. This is recommended every year. Tetanus, diphtheria, and acellular pertussis (Tdap, Td) vaccine. You may need a Td booster every 10 years. Zoster vaccine. You may need this after age 26. Pneumococcal 13-valent conjugate (PCV13) vaccine. One dose is recommended after age 34. Pneumococcal polysaccharide (PPSV23)  vaccine. One dose is recommended after age 4. Talk to your  health care provider about which screenings and vaccines you need and how often you need them. This information is not intended to replace advice given to you by your health care provider. Make sure you discuss any questions you have with your health care provider. Document Released: 05/05/2015 Document Revised: 12/27/2015 Document Reviewed: 02/07/2015 Elsevier Interactive Patient Education  2017 [place] Prevention in the Home Falls can cause injuries. They can happen to people of all ages. There are many things you can do to make your home safe and to help prevent falls. What can I do on the outside of my home? Regularly fix the edges of walkways and driveways and fix any cracks. Remove anything that might make you trip as you walk through a door, such as a raised step or threshold. Trim any bushes or trees on the path to your home. Use bright outdoor lighting. Clear any walking paths of anything that might make someone trip, such as rocks or tools. Regularly check to see if handrails are loose or broken. Make sure that both sides of any steps have handrails. Any raised decks and porches should have guardrails on the edges. Have any leaves, snow, or ice cleared regularly. Use sand or salt on walking paths during winter. Clean up any spills in your garage right away. This includes oil or grease spills. What can I do in the bathroom? Use night lights. Install grab bars by the toilet and in the tub and shower. Do not use towel bars as grab bars. Use non-skid mats or decals in the tub or shower. If you need to sit down in the shower, use a plastic, non-slip stool. Keep the floor dry. Clean up any water that spills on the floor as soon as it happens. Remove soap buildup in the tub or shower regularly. Attach bath mats securely with double-sided non-slip rug tape. Do not have throw rugs and other things on the floor that can make you trip. What can I do in the bedroom? Use night  lights. Make sure that you have a light by your bed that is easy to reach. Do not use any sheets or blankets that are too big for your bed. They should not hang down onto the floor. Have a firm chair that has side arms. You can use this for support while you get dressed. Do not have throw rugs and other things on the floor that can make you trip. What can I do in the kitchen? Clean up any spills right away. Avoid walking on wet floors. Keep items that you use a lot in easy-to-reach places. If you need to reach something above you, use a strong step stool that has a grab bar. Keep electrical cords out of the way. Do not use floor polish or wax that makes floors slippery. If you must use wax, use non-skid floor wax. Do not have throw rugs and other things on the floor that can make you trip. What can I do with my stairs? Do not leave any items on the stairs. Make sure that there are handrails on both sides of the stairs and use them. Fix handrails that are broken or loose. Make sure that handrails are as long as the stairways. Check any carpeting to make sure that it is firmly attached to the stairs. Fix any carpet that is loose or worn. Avoid having throw rugs at the top or bottom  of the stairs. If you do have throw rugs, attach them to the floor with carpet tape. Make sure that you have a light switch at the top of the stairs and the bottom of the stairs. If you do not have them, ask someone to add them for you. What else can I do to help prevent falls? Wear shoes that: Do not have high heels. Have rubber bottoms. Are comfortable and fit you well. Are closed at the toe. Do not wear sandals. If you use a stepladder: Make sure that it is fully opened. Do not climb a closed stepladder. Make sure that both sides of the stepladder are locked into place. Ask someone to hold it for you, if possible. Clearly mark and make sure that you can see: Any grab bars or handrails. First and last  steps. Where the edge of each step is. Use tools that help you move around (mobility aids) if they are needed. These include: Canes. Walkers. Scooters. Crutches. Turn on the lights when you go into a dark area. Replace any light bulbs as soon as they burn out. Set up your furniture so you have a clear path. Avoid moving your furniture around. If any of your floors are uneven, fix them. If there are any pets around you, be aware of where they are. Review your medicines with your doctor. Some medicines can make you feel dizzy. This can increase your chance of falling. Ask your doctor what other things that you can do to help prevent falls. This information is not intended to replace advice given to you by your health care provider. Make sure you discuss any questions you have with your health care provider. Document Released: 02/02/2009 Document Revised: 09/14/2015 Document Reviewed: 05/13/2014 Elsevier Interactive Patient Education  2017 [employer].

## 2021-10-22 NOTE — Progress Notes (Signed)
Subjective:   Cheryl Cisneros is a 76 y.o. female who presents for Medicare Annual (Subsequent) preventive examination.  Review of Systems     Cardiac Risk Factors include: advanced age (>47mn, >>42women);diabetes mellitus;hypertension;dyslipidemia     Objective:    Today's Vitals   10/22/21 0859  BP: 130/76  Pulse: 89  Resp: 16  Temp: 99.1 F (37.3 C)  SpO2: 98%  Weight: 125 lb 6.4 oz (56.9 kg)  Height: '5\' 1"'$  (1.549 m)   Body mass index is 23.69 kg/m.     10/22/2021    8:57 AM 10/16/2020    8:59 AM 08/19/2019    8:59 AM 08/18/2018    8:16 AM 08/15/2017    9:02 AM 07/26/2015   12:51 PM 06/05/2015   10:39 AM  Advanced Directives  Does Patient Have a Medical Advance Directive? No Yes Yes Yes Yes Yes Yes  Type of ASocial research officer, governmentLiving will HVernonLiving will HRocky FordLiving will HBataviaLiving will Living will;Healthcare Power of [place]uther[place]Living will  Does patient want to make changes to medical advance directive?   No - Patient declined No - Patient declined No - Patient declined  No - Patient declined  Copy of HJupiter Farmsin Chart?  No - copy requested No - copy requested No - copy requested No - copy requested  No - copy requested  Would patient like information on creating a medical advance directive? No - Patient declined          Current Medications (verified) Outpatient Encounter Medications as of 10/22/2021  Medication Sig   alendronate (FOSAMAX) 70 MG tablet TAKE ONE TABLET BY MOUTH ONCE A WEEK IN THE MORNING WITH A FULL GLASS OF WATER, 30 MINUTES BEFORE A MEAL OR BEVERAGE. REMAIN UPRIGHT   aspirin EC 81 MG tablet Take 1 tablet (81 mg total) by mouth daily.   Biotin 1000 MCG tablet Take 5,000 mcg by mouth 2 (two) times daily.    Calcium-Vitamin D 600-200 MG-UNIT per tablet Take 1 tablet by mouth 3 (three) times daily with meals.    cetirizine (ZYRTEC) 10 MG tablet Take 1 tablet by mouth once daily   Cholecalciferol (VITAMIN D3) 1000 UNITS CAPS Take 1 capsule by mouth daily.   CINNAMON PO Take by mouth. 500 mg- 3 per day with meals   CRANBERRY PO Take by mouth. 500 mg daily   EPIPEN 2-PAK 0.3 MG/0.3ML DEVI Reported on 07/26/2015   Ferrous Gluconate (IRON 27 PO) Take by mouth daily.   Garlic Oil 18299MG CAPS Take 1 capsule by mouth daily.   JARDIANCE 10 MG TABS tablet Take 10 mg by mouth daily.   L-Lysine 500 MG TABS Take 1 tablet by mouth daily.   levothyroxine (SYNTHROID) 100 MCG tablet Take 1 tablet by mouth once daily   Magnesium 250 MG TABS Take 1 tablet by mouth daily.   metFORMIN (GLUCOPHAGE) 1000 MG tablet TAKE 1 TABLET BY MOUTH TWICE DAILY WITH MEALS   nateglinide (STARLIX) 120 MG tablet TAKE 1 TABLET BY MOUTH THREE TIMES DAILY WITH MEALS   NONFORMULARY OR COMPOUNDED ITEM viviscal advanced hair health  1 po bid   olmesartan-hydrochlorothiazide (BENICAR HCT) 40-12.5 MG tablet Take 1 tablet by mouth once daily   Omega-3 Fatty Acids (OMEGA 3 PO) Take 2 tablets by mouth daily.   omeprazole (PRILOSEC) 40 MG capsule Take 1 capsule by mouth once daily  OneTouch Delica Lancets 02I MISC USE   TO CHECK GLUCOSE TWICE DAILY   ONETOUCH ULTRA test strip USE  STRIP TO CHECK GLUCOSE TWICE DAILY   OVER THE COUNTER MEDICATION Maxi-vision 2 capsules per day   pravastatin (PRAVACHOL) 10 MG tablet Take 1 tablet by mouth once daily   SELENIUM PO Take by mouth. 200 mcg daily   SIMPLY SALINE NA Place 1 spray into the nose daily.   Specialty Vitamins Products (HAIR NOURISHING SUPPLEMENT PO) Take by mouth. Per patient twice a day after meals.   Turmeric 500 MG CAPS Take 1 capsule by mouth 3 (three) times daily.    vitamin C (ASCORBIC ACID) 500 MG tablet Take 1,000 mg by mouth daily.   No facility-administered encounter medications on file as of 10/22/2021.    Allergies (verified) Losartan potassium, Metoprolol, and Norvasc  [amlodipine besylate]   History: Past Medical History:  Diagnosis Date   Allergy    Arthritis    Asthma    Borderline glaucoma    Cataract    Chronic kidney disease    kidney stones   Diabetes ([place])    GERD (gastroesophageal reflux disease)    Hypertension    Hypothyroidism 10/27/2006   Qualifier: Diagnosis of  By: Jerold Coombe     Macular degeneration 12/25/2011   Osteopenia    Thyroid disease    Hypothyroidism   Past Surgical History:  Procedure Laterality Date   ABDOMINAL HYSTERECTOMY  1986   TAH BSO   BACK SURGERY  2016   x3 total- 1999, 2016, 2017   bladder surgery     repair   CATARACT EXTRACTION Bilateral    COLONOSCOPY     EYE SURGERY Right 07/28/2017   Dr.Beavis. Cataract removal   KNEE ARTHROSCOPY Right 2002   LITHOTRIPSY     2004   LUMBAR LAMINECTOMY  1999   ROTATOR CUFF REPAIR Right 02/27/2005   TONSILLECTOMY  1963   Family History  Problem Relation Age of Onset   Diabetes Mother    Hypertension Mother    Kidney disease Mother        dialysis   Diabetes Father    Hypertension Father    Cancer Father        lung   Dementia Sister    Diabetes Sister    Hyperlipidemia Sister    Hypertension Sister    Diabetes Sister    Hypertension Sister    Hyperlipidemia Sister    Other Sister        blood disorder   Diabetes Brother    Hyperlipidemia Brother    Hypertension Brother    Cervical cancer Daughter    Cancer Daughter 51       cervical   Stomach cancer Other    Rectal cancer Other    Coronary artery disease Other    Lung cancer Other    Colon cancer Neg Hx    Esophageal cancer Neg Hx    Social History   Socioeconomic History   Marital status: Married    Spouse name: Not on file   Number of children: Not on file   Years of education: Not on file   Highest education level: Not on file  Occupational History   Occupation: housewife    Employer: UNEMPLOYED  Tobacco Use   Smoking status: Never   Smokeless tobacco: Never  Vaping  Use   Vaping Use: Never used  Substance and Sexual Activity   Alcohol use: No  Alcohol/week: 0.0 standard drinks of alcohol   Drug use: No   Sexual activity: Not Currently    Partners: Male  Other Topics Concern   Not on file  Social History Narrative   Exercise--  bootcamp 3 days a week and walking   Social Determinants of Health   Financial Resource Strain: Low Risk  (10/16/2020)   Overall Financial Resource Strain (CARDIA)    Difficulty of Paying Living Expenses: Not hard at all  Food Insecurity: No Food Insecurity (10/16/2020)   Hunger Vital Sign    Worried About Running Out of Food in the Last Year: Never true    [place] in the Last Year: Never true  Transportation Needs: No Transportation Needs (10/16/2020)   PRAPARE - Hydrologist (Medical): No    Lack of Transportation (Non-Medical): No  Physical Activity: Sufficiently Active (10/16/2020)   Exercise Vital Sign    Days of Exercise per Week: 7 days    Minutes of Exercise per Session: 40 min  Stress: No Stress Concern Present (10/16/2020)   [place]    Feeling of Stress : Not at all  Social Connections: [place] (10/16/2020)   Social Connection and Isolation Panel [NHANES]    Frequency of Communication with Friends and Family: More than three times a week    Frequency of Social Gatherings with Friends and Family: More than three times a week    Attends Religious Services: More than 4 times per year    Active Member of [employer] or Organizations: Yes    Attends Music therapist: More than 4 times per year    Marital Status: Married    Tobacco Counseling Counseling given: Not Answered   Clinical Intake:  Pre-visit preparation completed: Yes  Pain : No/denies pain     BMI - recorded: 23.69 Nutritional Status: BMI of 19-24  Normal Nutritional Risks: None Diabetes: Yes CBG done?: No Did  pt. bring in CBG monitor from home?: No  How often do you need to have someone help you when you read instructions, pamphlets, or other written materials from your doctor or pharmacy?: 1 - Never  Diabetic?yes Nutrition Risk Assessment:  Has the patient had any N/V/D within the last 2 months?  No  Does the patient have any non-healing wounds?  No  Has the patient had any unintentional weight loss or weight gain?  No   Diabetes:  Is the patient diabetic?  Yes  If diabetic, was a CBG obtained today?  No  Did the patient bring in their glucometer from home?  No  How often do you monitor your CBG's? Once daily-morning.   Financial Strains and Diabetes Management:  Are you having any financial strains with the device, your supplies or your medication? No .  Does the patient want to be seen by Chronic Care Management for management of their diabetes?  No  Would the patient like to be referred to a Nutritionist or for Diabetic Management?  No   Diabetic Exams:  Diabetic Eye Exam: Overdue for diabetic eye exam. Pt has been advised about the importance in completing this exam. Patient advised to call and schedule an eye exam. Diabetic Foot Exam: Completed 03/26/21    Interpreter Needed?: No  Information entered by :: [place] of Daily Living    10/22/2021    9:01 AM  In your present state of health, do you  have any difficulty performing the following activities:  Hearing? 0  Vision? 0  Difficulty concentrating or making decisions? 0  Walking or climbing stairs? 0  Dressing or bathing? 0  Doing errands, shopping? 0  Preparing Food and eating ? N  Using the Toilet? N  In the past six months, have you accidently leaked urine? N  Do you have problems with loss of bowel control? N  Managing your Medications? N  Managing your Finances? N  Housekeeping or managing your Housekeeping? N    Patient Care Team: Carollee Herter, Alferd Apa, DO as PCP - General Harold Hedge,  Darrick Grinder, MD as Consulting Physician (Allergy and Immunology) Odette Fraction as Consulting Physician (Optometry)  Indicate any recent Medical Services you may have received from other than [facility] providers in the past year (date may be approximate).     Assessment:   This is a routine wellness examination for [place].  Hearing/Vision screen No results found.  Dietary issues and exercise activities discussed: Current Exercise Habits: Home exercise routine, Type of exercise: walking, Time (Minutes): 60, Frequency (Times/Week): 7, Weekly Exercise (Minutes/Week): 420, Intensity: Mild, Exercise limited by: None identified   Goals Addressed   None    Depression Screen    10/22/2021    8:58 AM 10/16/2020    9:00 AM 09/19/2020    9:00 AM 08/19/2019    9:01 AM 08/18/2018    8:17 AM 08/15/2017    9:03 AM 12/03/2016    9:59 AM  PHQ 2/9 Scores  PHQ - 2 Score 0 0 0 0 0 0 0    Fall Risk    10/22/2021    8:58 AM 10/16/2020    9:00 AM 09/19/2020    9:01 AM 08/19/2019    9:00 AM 08/18/2018    8:17 AM  Fall Risk   Falls in the past year? 0 0 0 0 0  Number falls in past yr: 0 0 0 0   Injury with Fall? 0 0 0 0   Risk for fall due to : No Fall Risks      Follow up Falls evaluation completed Falls prevention discussed Falls evaluation completed Education provided;Falls prevention discussed     FALL RISK PREVENTION PERTAINING TO THE HOME:  Any stairs in or around the home? Yes  If so, are there any without handrails? No  Home free of loose throw rugs in walkways, pet beds, electrical cords, etc? Yes  Adequate lighting in your home to reduce risk of falls? Yes   ASSISTIVE DEVICES UTILIZED TO PREVENT FALLS:  Life alert? No  Use of a cane, walker or w/c? No  Grab bars in the bathroom? Yes  Shower chair or bench in shower? No  Elevated toilet seat or a handicapped toilet? No   TIMED UP AND GO:  Was the test performed? Yes .  Length of time to ambulate 10 feet: 9 sec.   Gait steady and  fast without use of assistive device  Cognitive Function:        10/22/2021    9:08 AM  6CIT Screen  What Year? 0 points  What month? 0 points  What time? 0 points  Count back from 20 0 points  Months in reverse 0 points  Repeat phrase 2 points  Total Score 2 points    Immunizations Immunization History  Administered Date(s) Administered   Fluad Quad(high Dose 65+) 12/09/2018   Hep A / Hep B 12/09/2018, 01/12/2019   Hepatitis A 07/30/2019  Hepatitis B 07/30/2019   Influenza Split 02/12/2011   Influenza Whole 02/01/2009   Influenza, High Dose Seasonal PF 02/06/2015, 01/04/2016, 03/04/2016, 03/28/2017, 03/31/2018, 04/01/2019, 02/23/2020, 02/22/2021   Influenza,inj,Quad PF,6+ Mos 12/28/2013   Influenza-Unspecified 01/23/2018, 12/30/2019, 01/31/2021   PFIZER(Purple Top)SARS-COV-2 Vaccination 06/14/2019, 07/05/2019, 01/27/2020, 02/23/2020   PNEUMOCOCCAL CONJUGATE-20 06/29/2020   Pneumococcal Conjugate-13 06/24/2013, 12/09/2018   Pneumococcal Polysaccharide-23 12/29/2007, 12/13/2008, 04/14/2014, 03/04/2016, 03/28/2017, 03/31/2018, 04/01/2019, 09/13/2019, 02/23/2020, 02/22/2021   Td 09/16/2001   Tdap 06/28/2013   Zoster Recombinat (Shingrix) 11/12/2017, 01/23/2018, 07/12/2021   Zoster, Live 11/12/2007    TDAP status: Up to date  Flu Vaccine status: Up to date  Pneumococcal vaccine status: Up to date  Covid-19 vaccine status: Declined, Education has been provided regarding the importance of this vaccine but patient still declined. Advised may receive this vaccine at local pharmacy or Health Dept.or vaccine clinic. Aware to provide a copy of the vaccination record if obtained from local pharmacy or Health Dept. Verbalized acceptance and understanding.  Qualifies for Shingles Vaccine? Yes   Zostavax completed No   Shingrix Completed?: Yes  Screening Tests Health Maintenance  Topic Date Due   COVID-19 Vaccine (5 - Pfizer series) 04/19/2020   OPHTHALMOLOGY EXAM  02/08/2021    HEMOGLOBIN A1C  09/24/2021   INFLUENZA VACCINE  11/20/2021   FOOT EXAM  03/26/2022   MAMMOGRAM  05/17/2022   DEXA SCAN  05/18/2023   TETANUS/TDAP  06/29/2023   Pneumonia Vaccine 45+ Years old  Completed   Hepatitis C Screening  Completed   Zoster Vaccines- Shingrix  Completed   HPV VACCINES  Aged Out   COLONOSCOPY (Pts 45-17yr Insurance coverage will need to be confirmed)  Discontinued    Health Maintenance  Health Maintenance Due  Topic Date Due   COVID-19 Vaccine (5 - Pfizer series) 04/19/2020   OPHTHALMOLOGY EXAM  02/08/2021   HEMOGLOBIN A1C  09/24/2021    Colorectal cancer screening: No longer required.   Mammogram status: Completed 05/17/21. Repeat every year  Bone Density status: Completed 05/17/21. Results reflect: Bone density results: NORMAL. Repeat every 2 years.  Lung Cancer Screening: (Low Dose CT Chest recommended if Age 78-80years, 30 pack-year currently smoking OR have quit w/in 15years.) does not qualify.   Lung Cancer Screening Referral: N/A  Additional Screening:  Hepatitis C Screening: does qualify; Completed 03/02/15  Vision Screening: Recommended annual ophthalmology exams for early detection of glaucoma and other disorders of the eye. Is the patient up to date with their annual eye exam?  Yes  Who is the provider or what is the name of the office in which the patient attends annual eye exams? Dr. TLindalou HoseIf pt is not established with a provider, would they like to be referred to a provider to establish care? No .   Dental Screening: Recommended annual dental exams for proper oral hygiene  Community Resource Referral / Chronic Care Management: CRR required this visit?  No   CCM required this visit?  No      Plan:     I have personally reviewed and noted the following in the patient's chart:   Medical and social history Use of alcohol, tobacco or illicit drugs  Current medications and supplements including opioid prescriptions.   Functional ability and status Nutritional status Physical activity Advanced directives List of other physicians Hospitalizations, surgeries, and ER visits in previous 12 months Vitals Screenings to include cognitive, depression, and falls Referrals and appointments  In addition, I have reviewed and discussed with patient certain  preventive protocols, quality metrics, and best practice recommendations. A written personalized care plan for preventive services as well as general preventive health recommendations were provided to patient.     Duard Brady Zerah Hilyer, [place]   10/22/2021   Nurse Notes: none

## 2021-11-08 DIAGNOSIS — N183 Chronic kidney disease, stage 3 unspecified: Secondary | ICD-10-CM | POA: Diagnosis not present

## 2021-11-14 DIAGNOSIS — E1122 Type 2 diabetes mellitus with diabetic chronic kidney disease: Secondary | ICD-10-CM | POA: Diagnosis not present

## 2021-11-14 DIAGNOSIS — J301 Allergic rhinitis due to pollen: Secondary | ICD-10-CM | POA: Diagnosis not present

## 2021-11-14 DIAGNOSIS — J3089 Other allergic rhinitis: Secondary | ICD-10-CM | POA: Diagnosis not present

## 2021-11-14 DIAGNOSIS — I129 Hypertensive chronic kidney disease with stage 1 through stage 4 chronic kidney disease, or unspecified chronic kidney disease: Secondary | ICD-10-CM | POA: Diagnosis not present

## 2021-11-14 DIAGNOSIS — N183 Chronic kidney disease, stage 3 unspecified: Secondary | ICD-10-CM | POA: Diagnosis not present

## 2021-11-14 DIAGNOSIS — J3081 Allergic rhinitis due to animal (cat) (dog) hair and dander: Secondary | ICD-10-CM | POA: Diagnosis not present

## 2021-11-14 DIAGNOSIS — D631 Anemia in chronic kidney disease: Secondary | ICD-10-CM | POA: Diagnosis not present

## 2021-11-15 ENCOUNTER — Other Ambulatory Visit: Payer: Self-pay | Admitting: Family Medicine

## 2021-11-15 DIAGNOSIS — K219 Gastro-esophageal reflux disease without esophagitis: Secondary | ICD-10-CM

## 2021-11-15 DIAGNOSIS — E1151 Type 2 diabetes mellitus with diabetic peripheral angiopathy without gangrene: Secondary | ICD-10-CM

## 2021-11-15 DIAGNOSIS — E785 Hyperlipidemia, unspecified: Secondary | ICD-10-CM

## 2021-12-04 ENCOUNTER — Encounter: Payer: Self-pay | Admitting: Family Medicine

## 2021-12-04 ENCOUNTER — Ambulatory Visit (INDEPENDENT_AMBULATORY_CARE_PROVIDER_SITE_OTHER): Payer: Medicare Other | Admitting: Family Medicine

## 2021-12-04 VITALS — BP 128/78 | HR 84 | Temp 98.0°F | Resp 18 | Ht 61.0 in | Wt 127.8 lb

## 2021-12-04 DIAGNOSIS — H9311 Tinnitus, right ear: Secondary | ICD-10-CM | POA: Insufficient documentation

## 2021-12-04 DIAGNOSIS — E1122 Type 2 diabetes mellitus with diabetic chronic kidney disease: Secondary | ICD-10-CM | POA: Diagnosis not present

## 2021-12-04 DIAGNOSIS — E1169 Type 2 diabetes mellitus with other specified complication: Secondary | ICD-10-CM

## 2021-12-04 DIAGNOSIS — Z0000 Encounter for general adult medical examination without abnormal findings: Secondary | ICD-10-CM | POA: Diagnosis not present

## 2021-12-04 DIAGNOSIS — M25512 Pain in left shoulder: Secondary | ICD-10-CM | POA: Diagnosis not present

## 2021-12-04 DIAGNOSIS — I10 Essential (primary) hypertension: Secondary | ICD-10-CM | POA: Diagnosis not present

## 2021-12-04 DIAGNOSIS — M25511 Pain in right shoulder: Secondary | ICD-10-CM

## 2021-12-04 DIAGNOSIS — E039 Hypothyroidism, unspecified: Secondary | ICD-10-CM | POA: Diagnosis not present

## 2021-12-04 DIAGNOSIS — N184 Chronic kidney disease, stage 4 (severe): Secondary | ICD-10-CM | POA: Diagnosis not present

## 2021-12-04 DIAGNOSIS — E785 Hyperlipidemia, unspecified: Secondary | ICD-10-CM

## 2021-12-04 LAB — LIPID PANEL
Cholesterol: 130 mg/dL (ref 0–200)
HDL: 43.9 mg/dL (ref >39.00)
LDL Cholesterol: 49 mg/dL (ref 0–99)
NonHDL: 86.33
Total CHOL/HDL Ratio: 3
Triglycerides: 187 mg/dL — ABNORMAL HIGH (ref 0.0–149.0)
VLDL: 37.4 mg/dL (ref 0.0–40.0)

## 2021-12-04 LAB — CBC WITH DIFFERENTIAL/PLATELET
Basophils Absolute: 0.1 10*3/uL (ref 0.0–0.1)
Basophils Relative: 1 % (ref 0.0–3.0)
Eosinophils Absolute: 0.2 10*3/uL (ref 0.0–0.7)
Eosinophils Relative: 3.6 % (ref 0.0–5.0)
HCT: 37.6 % (ref 36.0–46.0)
Hemoglobin: 12.4 g/dL (ref 12.0–15.0)
Lymphocytes Relative: 40.8 % (ref 12.0–46.0)
Lymphs Abs: 2.4 10*3/uL (ref 0.7–4.0)
MCHC: 33 g/dL (ref 30.0–36.0)
MCV: 99 fl (ref 78.0–100.0)
Monocytes Absolute: 0.3 10*3/uL (ref 0.1–1.0)
Monocytes Relative: 5.7 % (ref 3.0–12.0)
Neutro Abs: 2.8 10*3/uL (ref 1.4–7.7)
Neutrophils Relative %: 48.9 % (ref 43.0–77.0)
Platelets: 194 10*3/uL (ref 150.0–400.0)
RBC: 3.8 Mil/uL — ABNORMAL LOW (ref 3.87–5.11)
RDW: 13.2 % (ref 11.5–15.5)
WBC: 5.8 10*3/uL (ref 4.0–10.5)

## 2021-12-04 LAB — COMPREHENSIVE METABOLIC PANEL
ALT: 23 U/L (ref 0–35)
AST: 20 U/L (ref 0–37)
Albumin: 4.8 g/dL (ref 3.5–5.2)
Alkaline Phosphatase: 26 U/L — ABNORMAL LOW (ref 39–117)
BUN: 40 mg/dL — ABNORMAL HIGH (ref 6–23)
CO2: 26 mEq/L (ref 19–32)
Calcium: 10 mg/dL (ref 8.4–10.5)
Chloride: 97 mEq/L (ref 96–112)
Creatinine, Ser: 1.62 mg/dL — ABNORMAL HIGH (ref 0.40–1.20)
GFR: 30.32 mL/min — ABNORMAL LOW (ref >60.00)
Glucose, Bld: 128 mg/dL — ABNORMAL HIGH (ref 70–99)
Potassium: 5.4 mEq/L — ABNORMAL HIGH (ref 3.5–5.1)
Sodium: 136 mEq/L (ref 135–145)
Total Bilirubin: 0.5 mg/dL (ref 0.2–1.2)
Total Protein: 7.5 g/dL (ref 6.0–8.3)

## 2021-12-04 LAB — VITAMIN B12: Vitamin B-12: >1500 pg/mL — ABNORMAL HIGH (ref 211–911)

## 2021-12-04 LAB — MICROALBUMIN / CREATININE URINE RATIO
Creatinine,U: 25.5 mg/dL
Microalb Creat Ratio: 12 mg/g (ref 0.0–30.0)
Microalb, Ur: 3.1 mg/dL — ABNORMAL HIGH (ref 0.0–1.9)

## 2021-12-04 LAB — TSH: TSH: 1.16 u[IU]/mL (ref 0.35–5.50)

## 2021-12-04 LAB — HEMOGLOBIN A1C: Hgb A1c MFr Bld: 6.9 % — ABNORMAL HIGH (ref 4.6–6.5)

## 2021-12-04 LAB — VITAMIN D 25 HYDROXY (VIT D DEFICIENCY, FRACTURES): VITD: 51.28 ng/mL (ref 30.00–100.00)

## 2021-12-04 NOTE — Assessment & Plan Note (Signed)
Encourage heart healthy diet such as MIND or DASH diet, increase exercise, avoid trans fats, simple carbohydrates and processed foods, consider a krill or fish or flaxseed oil cap daily.  °

## 2021-12-04 NOTE — Assessment & Plan Note (Signed)
Refer to ent Check labs

## 2021-12-04 NOTE — Assessment & Plan Note (Signed)
Check labs 

## 2021-12-04 NOTE — Progress Notes (Signed)
Subjective:   By signing my name below, I, Cheryl Cisneros, attest that this documentation has been prepared under the direction and in the presence of Dr. Roma Schanz, DO. 12/04/2021    Patient ID: Cheryl Cisneros, female    DOB: 1946/04/04, 76 y.o.   MRN: [id number]  Chief Complaint  Patient presents with   Annual Exam    Pt states fasting     HPI Patient is in today for a comprehensive physical exam.  She complains of constant high pitched ringing in the right ear for the past 2 years. She has not seen another provider for her symptoms.  She complains of a buzzing sensation starting from her neck that radiates down to her shoulders. She reports her latest episode of this sensation caused her brief pain for the first time. She denies having any chest pain or SOB.  Her blood pressure is doing well during this visit.  BP Readings from Last 3 Encounters:  12/04/21 128/78  10/22/21 130/76  03/26/21 (!) 142/72   Pulse Readings from Last 3 Encounters:  12/04/21 84  10/22/21 89  03/26/21 73   She follows up regularly with her allergist specialist, eye doctor.  She continues following up with Dr. Candiss Norse for her kidneys. She also follows up with them for her diabetes. She has an upcomming appointment with him next week and is receiving blood work during that visit.  She denies having any fever, new moles, congestion, sore throat, new muscle pain, new joint pain, chest pain, cough, SOB, wheezing, n/v/d, constipation, blood in stool, dysuria, frequency, hematuria, or headaches at this time.  She is UTD on vision care.    Past Medical History:  Diagnosis Date   Allergy    Arthritis    Asthma    Borderline glaucoma    Cataract    Chronic kidney disease    kidney stones   Diabetes ([place])    GERD (gastroesophageal reflux disease)    Hypertension    Hypothyroidism 10/27/2006   Qualifier: Diagnosis of  By: Jerold Coombe     Macular degeneration 12/25/2011   Osteopenia     Thyroid disease    Hypothyroidism    Past Surgical History:  Procedure Laterality Date   ABDOMINAL HYSTERECTOMY  1986   TAH BSO   BACK SURGERY  2016   x3 total- 1999, 2016, 2017   bladder surgery     repair   CATARACT EXTRACTION Bilateral    COLONOSCOPY     EYE SURGERY Right 07/28/2017   Dr.Beavis. Cataract removal   KNEE ARTHROSCOPY Right 2002   LITHOTRIPSY     2004   LUMBAR LAMINECTOMY  1999   ROTATOR CUFF REPAIR Right 02/27/2005   TONSILLECTOMY  1963    Family History  Problem Relation Age of Onset   Diabetes Mother    Hypertension Mother    Kidney disease Mother        dialysis   Diabetes Father    Hypertension Father    Cancer Father        lung   Dementia Sister    Diabetes Sister    Hyperlipidemia Sister    Hypertension Sister    Diabetes Sister    Hypertension Sister    Hyperlipidemia Sister    Other Sister        blood disorder   Diabetes Brother    Hyperlipidemia Brother    Hypertension Brother    Cervical cancer Daughter    Cancer Daughter  39       cervical   Stomach cancer Other    Rectal cancer Other    Coronary artery disease Other    Lung cancer Other    Colon cancer Neg Hx    Esophageal cancer Neg Hx     Social History   Socioeconomic History   Marital status: Married    Spouse name: Not on file   Number of children: Not on file   Years of education: Not on file   Highest education level: Not on file  Occupational History   Occupation: housewife    Employer: UNEMPLOYED  Tobacco Use   Smoking status: Never   Smokeless tobacco: Never  Vaping Use   Vaping Use: Never used  Substance and Sexual Activity   Alcohol use: No    Alcohol/week: 0.0 standard drinks of alcohol   Drug use: No   Sexual activity: Not Currently    Partners: Male  Other Topics Concern   Not on file  Social History Narrative   Exercise--  bootcamp 3 days a week and walking   Social Determinants of Health   Financial Resource Strain: Low Risk   (10/16/2020)   Overall Financial Resource Strain (CARDIA)    Difficulty of Paying Living Expenses: Not hard at all  Food Insecurity: No Food Insecurity (10/16/2020)   Hunger Vital Sign    Worried About Running Out of Food in the Last Year: Never true    [place] in the Last Year: Never true  Transportation Needs: No Transportation Needs (10/16/2020)   PRAPARE - Hydrologist (Medical): No    Lack of Transportation (Non-Medical): No  Physical Activity: Sufficiently Active (10/16/2020)   Exercise Vital Sign    Days of Exercise per Week: 7 days    Minutes of Exercise per Session: 40 min  Stress: No Stress Concern Present (10/16/2020)   [place]    Feeling of Stress : Not at all  Social Connections: [place] (10/16/2020)   Social Connection and Isolation Panel [NHANES]    Frequency of Communication with Friends and Family: More than three times a week    Frequency of Social Gatherings with Friends and Family: More than three times a week    Attends Religious Services: More than 4 times per year    Active Member of [employer] or Organizations: Yes    Attends Music therapist: More than 4 times per year    Marital Status: Married  Human resources officer Violence: Not At Risk (10/16/2020)   Humiliation, Afraid, Rape, and Kick questionnaire    Fear of Current or Ex-Partner: No    Emotionally Abused: No    Physically Abused: No    Sexually Abused: No    Outpatient Medications Prior to Visit  Medication Sig Dispense Refill   alendronate (FOSAMAX) 70 MG tablet TAKE ONE TABLET BY MOUTH ONCE A WEEK IN THE MORNING WITH A FULL GLASS OF WATER, 30 MINUTES BEFORE A MEAL OR BEVERAGE. REMAIN UPRIGHT 12 tablet 3   aspirin EC 81 MG tablet Take 1 tablet (81 mg total) by mouth daily.     Biotin 1000 MCG tablet Take 5,000 mcg by mouth 2 (two) times daily.      Calcium-Vitamin D 600-200 MG-UNIT  per tablet Take 1 tablet by mouth 3 (three) times daily with meals.     cetirizine (ZYRTEC) 10 MG tablet Take 1 tablet by mouth once  daily 90 tablet 0   Cholecalciferol (VITAMIN D3) 1000 UNITS CAPS Take 1 capsule by mouth daily.     CINNAMON PO Take by mouth. 500 mg- 3 per day with meals     CRANBERRY PO Take by mouth. 500 mg daily     EPIPEN 2-PAK 0.3 MG/0.3ML DEVI Reported on 07/26/2015     Ferrous Gluconate (IRON 27 PO) Take by mouth daily.     Garlic Oil 4098 MG CAPS Take 1 capsule by mouth daily.     JARDIANCE 10 MG TABS tablet Take 10 mg by mouth daily.     L-Lysine 500 MG TABS Take 1 tablet by mouth daily.     levothyroxine (SYNTHROID) 100 MCG tablet Take 1 tablet by mouth once daily 90 tablet 0   Magnesium 250 MG TABS Take 1 tablet by mouth daily.     metFORMIN (GLUCOPHAGE) 1000 MG tablet TAKE 1 TABLET BY MOUTH TWICE DAILY WITH MEALS 180 tablet 0   nateglinide (STARLIX) 120 MG tablet TAKE 1 TABLET BY MOUTH THREE TIMES DAILY WITH MEALS 270 tablet 0   NONFORMULARY OR COMPOUNDED ITEM viviscal advanced hair health  1 po bid 30 each 5   olmesartan-hydrochlorothiazide (BENICAR HCT) 40-12.5 MG tablet Take 1 tablet by mouth once daily 90 tablet 0   Omega-3 Fatty Acids (OMEGA 3 PO) Take 2 tablets by mouth daily.     omeprazole (PRILOSEC) 40 MG capsule Take 1 capsule by mouth once daily 90 capsule 0   OneTouch Delica Lancets 11B MISC USE   TO CHECK GLUCOSE TWICE DAILY 200 each 12   ONETOUCH ULTRA test strip USE  STRIP TO CHECK GLUCOSE TWICE DAILY 200 each 0   OVER THE COUNTER MEDICATION Maxi-vision 2 capsules per day     pravastatin (PRAVACHOL) 10 MG tablet Take 1 tablet by mouth once daily 90 tablet 0   SELENIUM PO Take by mouth. 200 mcg daily     SIMPLY SALINE NA Place 1 spray into the nose daily.     Specialty Vitamins Products (HAIR NOURISHING SUPPLEMENT PO) Take by mouth. Per patient twice a day after meals.     Turmeric 500 MG CAPS Take 1 capsule by mouth 3 (three) times daily.       vitamin C (ASCORBIC ACID) 500 MG tablet Take 1,000 mg by mouth daily.     No facility-administered medications prior to visit.    Allergies  Allergen Reactions   Losartan Potassium Other (See Comments)    Hair loss   Metoprolol     Hair loss   Norvasc [Amlodipine Besylate] Other (See Comments)    HAIR LOSS    Review of Systems  Constitutional:  Negative for fever and malaise/fatigue.  HENT:  Positive for tinnitus (right ear). Negative for congestion, sinus pain and sore throat.   Eyes:  Negative for blurred vision.  Respiratory:  Negative for cough, shortness of breath and wheezing.   Cardiovascular:  Negative for chest pain, palpitations and leg swelling.  Gastrointestinal:  Negative for abdominal pain, blood in stool, constipation, diarrhea, nausea and vomiting.  Genitourinary:  Negative for dysuria, frequency and hematuria.  Musculoskeletal:  Negative for falls.       (-)new muscle pain (-)new joint pain (+)pain from neck that radiates down to bilateral shoulders   Skin:  Negative for rash.       (-)New moles  Neurological:  Negative for dizziness, loss of consciousness and headaches.  Endo/Heme/Allergies:  Negative for environmental allergies.  Psychiatric/Behavioral:  Negative for depression. The patient is not nervous/anxious.        Objective:    Physical Exam Vitals and nursing note reviewed.  Constitutional:      General: She is not in acute distress.    Appearance: Normal appearance. She is well-developed. She is not ill-appearing.  HENT:     Head: Normocephalic and atraumatic.     Right Ear: Tympanic membrane, ear canal and external ear normal.     Left Ear: Tympanic membrane, ear canal and external ear normal.     Nose: Nose normal.  Eyes:     Extraocular Movements: Extraocular movements intact.     Pupils: Pupils are equal, round, and reactive to light.  Cardiovascular:     Rate and Rhythm: Normal rate and regular rhythm.     Heart sounds: Normal  heart sounds. No murmur heard.    No gallop.  Pulmonary:     Effort: Pulmonary effort is normal. No respiratory distress.     Breath sounds: Normal breath sounds. No wheezing or rales.  Chest:     Chest wall: No tenderness.  Abdominal:     General: Bowel sounds are normal. There is no distension.     Palpations: Abdomen is soft.     Tenderness: There is no abdominal tenderness. There is no guarding.  Musculoskeletal:        General: No swelling, tenderness, deformity or signs of injury. Normal range of motion.     Cervical back: Normal range of motion and neck supple.     Right lower leg: No edema.     Left lower leg: No edema.     Comments: Had 1 episode of a buzzing sensation in her shoulders that lasted about 3 minutes  No cp, no sob  Skin:    General: Skin is warm and dry.  Neurological:     Mental Status: She is alert and oriented to person, place, and time.  Psychiatric:        Behavior: Behavior normal.        Thought Content: Thought content normal.        Judgment: Judgment normal.   Ekg--- nsr --- no acute changes   BP 128/78 (BP Location: Left Arm, Patient Position: Sitting, Cuff Size: Normal)   Pulse 84   Temp 98 F (36.7 C) (Oral)   Resp 18   Ht '5\' 1"'$  (1.549 m)   Wt 127 lb 12.8 oz (58 kg)   SpO2 96%   BMI 24.15 kg/m  Wt Readings from Last 3 Encounters:  12/04/21 127 lb 12.8 oz (58 kg)  10/22/21 125 lb 6.4 oz (56.9 kg)  03/26/21 135 lb 6.4 oz (61.4 kg)    Diabetic Foot Exam - Simple   No data filed    Lab Results  Component Value Date   WBC 7.0 03/26/2021   HGB 11.7 (L) 03/26/2021   HCT 35.0 (L) 03/26/2021   PLT 207.0 03/26/2021   GLUCOSE 133 (H) 03/26/2021   CHOL 134 03/26/2021   TRIG 276.0 (H) 03/26/2021   HDL 42.00 03/26/2021   LDLDIRECT 71.0 03/26/2021   LDLCALC 52 12/26/2020   ALT 17 03/26/2021   AST 14 03/26/2021   NA 140 03/26/2021   K 5.2 (H) 03/26/2021   CL 101 03/26/2021   CREATININE 1.71 (H) 03/26/2021   BUN 48 (H) 03/26/2021    CO2 26 03/26/2021   TSH 0.89 03/26/2021   HGBA1C 7.0 (H) 03/26/2021   MICROALBUR 3.3 (H) 09/19/2020  Lab Results  Component Value Date   TSH 0.89 03/26/2021   Lab Results  Component Value Date   WBC 7.0 03/26/2021   HGB 11.7 (L) 03/26/2021   HCT 35.0 (L) 03/26/2021   MCV 98.7 03/26/2021   PLT 207.0 03/26/2021   Lab Results  Component Value Date   NA 140 03/26/2021   K 5.2 (H) 03/26/2021   CO2 26 03/26/2021   GLUCOSE 133 (H) 03/26/2021   BUN 48 (H) 03/26/2021   CREATININE 1.71 (H) 03/26/2021   BILITOT 0.3 03/26/2021   ALKPHOS 29 (L) 03/26/2021   AST 14 03/26/2021   ALT 17 03/26/2021   PROT 7.4 03/26/2021   ALBUMIN 4.7 03/26/2021   CALCIUM 10.4 03/26/2021   GFR 28.55 (L) 03/26/2021   Lab Results  Component Value Date   CHOL 134 03/26/2021   Lab Results  Component Value Date   HDL 42.00 03/26/2021   Lab Results  Component Value Date   LDLCALC 52 12/26/2020   Lab Results  Component Value Date   TRIG 276.0 (H) 03/26/2021   Lab Results  Component Value Date   CHOLHDL 3 03/26/2021   Lab Results  Component Value Date   HGBA1C 7.0 (H) 03/26/2021   Mammogram- Last completed 05/17/2021. Results are normal. Repeat in 1 year.  Colonoscopy- Last completed 08/09/2015. Results showed: - One 7 mm polyp in the sigmoid colon, removed with a cold snare. Resected and retrieved. - Internal hemorrhoids. - Mild diverticulosis in the sigmoid colon and in the descending colon. - Repeat colonoscopy in 5 years for surveillance if polyp is precancerous, otherwise no plans for repeat screeening colonoscopy due to age. Dexa- Last completed 05/17/2021. Results are normal.      Assessment & Plan:   Problem List Items Addressed This Visit       Unprioritized   Diabetes mellitus, type II ([place])   Relevant Orders   CBC with Differential/Platelet   Comprehensive metabolic panel   Hemoglobin A1c   Lipid panel   Microalbumin / creatinine urine ratio   Preventative  health care - Primary    ghm utd Check labs  See avs       Hypothyroidism    Check labs       Relevant Orders   CBC with Differential/Platelet   Comprehensive metabolic panel   TSH   Hyperlipidemia associated with type 2 diabetes mellitus ([place])    Encourage heart healthy diet such as MIND or DASH diet, increase exercise, avoid trans fats, simple carbohydrates and processed foods, consider a krill or fish or flaxseed oil cap daily.       Relevant Orders   CBC with Differential/Platelet   Comprehensive metabolic panel   Hemoglobin A1c   Lipid panel   Microalbumin / creatinine urine ratio   Essential hypertension    Well controlled, no changes to meds. Encouraged heart healthy diet such as the DASH diet and exercise as tolerated.       Other Visit Diagnoses     Primary hypertension       Relevant Orders   CBC with Differential/Platelet   Comprehensive metabolic panel   Hemoglobin A1c   Lipid panel   Microalbumin / creatinine urine ratio   Chronic kidney disease, stage 4 (severe) ([facility])       Relevant Orders   CBC with Differential/Platelet   Comprehensive metabolic panel   Hemoglobin A1c   Lipid panel   Microalbumin / creatinine urine ratio   Tinnitus aurium, right  Relevant Orders   Ambulatory referral to ENT   Vitamin B12   VITAMIN D 25 Hydroxy (Vit-D Deficiency, Fractures)   Bilateral shoulder pain, unspecified chronicity       Relevant Orders   EKG 12-Lead (Completed)        No orders of the defined types were placed in this encounter.   IAnn Held, DO, personally preformed the services described in this documentation.  All medical record entries made by the scribe were at my direction and in my presence.  I have reviewed the chart and discharge instructions (if applicable) and agree that the record reflects my personal performance and is accurate and complete. 12/04/2021   I,Cheryl Cisneros,acting as a scribe for Ann Held,  DO.,have documented all relevant documentation on the behalf of Ann Held, DO,as directed by  Ann Held, DO while in the presence of Ann Held, DO.   Ann Held, DO

## 2021-12-04 NOTE — Patient Instructions (Signed)
Preventive Care 65 Years and Older, Female Preventive care refers to lifestyle choices and visits with your health care provider that can promote health and wellness. Preventive care visits are also called wellness exams. What can I expect for my preventive care visit? Counseling Your health care provider may ask you questions about your: Medical history, including: Past medical problems. Family medical history. Pregnancy and menstrual history. History of falls. Current health, including: Memory and ability to understand (cognition). Emotional well-being. Home life and relationship well-being. Sexual activity and sexual health. Lifestyle, including: Alcohol, nicotine or tobacco, and drug use. Access to firearms. Diet, exercise, and sleep habits. Work and work environment. Sunscreen use. Safety issues such as seatbelt and bike helmet use. Physical exam Your health care provider will check your: Height and weight. These may be used to calculate your BMI (body mass index). BMI is a measurement that tells if you are at a healthy weight. Waist circumference. This measures the distance around your waistline. This measurement also tells if you are at a healthy weight and may help predict your risk of certain diseases, such as type 2 diabetes and high blood pressure. Heart rate and blood pressure. Body temperature. Skin for abnormal spots. What immunizations do I need?  Vaccines are usually given at various ages, according to a schedule. Your health care provider will recommend vaccines for you based on your age, medical history, and lifestyle or other factors, such as travel or where you work. What tests do I need? Screening Your health care provider may recommend screening tests for certain conditions. This may include: Lipid and cholesterol levels. Hepatitis C test. Hepatitis B test. HIV (human immunodeficiency virus) test. STI (sexually transmitted infection) testing, if you are at  risk. Lung cancer screening. Colorectal cancer screening. Diabetes screening. This is done by checking your blood sugar (glucose) after you have not eaten for a while (fasting). Mammogram. Talk with your health care provider about how often you should have regular mammograms. BRCA-related cancer screening. This may be done if you have a family history of breast, ovarian, tubal, or peritoneal cancers. Bone density scan. This is done to screen for osteoporosis. Talk with your health care provider about your test results, treatment options, and if necessary, the need for more tests. Follow these instructions at home: Eating and drinking  Eat a diet that includes fresh fruits and vegetables, whole grains, lean protein, and low-fat dairy products. Limit your intake of foods with high amounts of sugar, saturated fats, and salt. Take vitamin and mineral supplements as recommended by your health care provider. Do not drink alcohol if your health care provider tells you not to drink. If you drink alcohol: Limit how much you have to 0-1 drink a day. Know how much alcohol is in your drink. In the [place] one drink equals one 12 oz bottle of beer (355 mL), one 5 oz glass of wine (148 mL), or one 1 oz glass of hard liquor (44 mL). Lifestyle Brush your teeth every morning and night with fluoride toothpaste. Floss one time each day. Exercise for at least 30 minutes 5 or more days each week. Do not use any products that contain nicotine or tobacco. These products include cigarettes, chewing tobacco, and vaping devices, such as e-cigarettes. If you need help quitting, ask your health care provider. Do not use drugs. If you are sexually active, practice safe sex. Use a condom or other form of protection in order to prevent STIs. Take aspirin only as told by   your health care provider. Make sure that you understand how much to take and what form to take. Work with your health care provider to find out whether it  is safe and beneficial for you to take aspirin daily. Ask your health care provider if you need to take a cholesterol-lowering medicine (statin). Find healthy ways to manage stress, such as: Meditation, yoga, or listening to music. Journaling. Talking to a trusted person. Spending time with friends and family. Minimize exposure to UV radiation to reduce your risk of skin cancer. Safety Always wear your seat belt while driving or riding in a vehicle. Do not drive: If you have been drinking alcohol. Do not ride with someone who has been drinking. When you are tired or distracted. While texting. If you have been using any mind-altering substances or drugs. Wear a helmet and other protective equipment during sports activities. If you have firearms in your house, make sure you follow all gun safety procedures. What's next? Visit your health care provider once a year for an annual wellness visit. Ask your health care provider how often you should have your eyes and teeth checked. Stay up to date on all vaccines. This information is not intended to replace advice given to you by your health care provider. Make sure you discuss any questions you have with your health care provider. Document Revised: 10/04/2020 Document Reviewed: 10/04/2020 Elsevier Patient Education  2023 [employer]  

## 2021-12-04 NOTE — Assessment & Plan Note (Signed)
ghm utd Check labs  See avs  

## 2021-12-04 NOTE — Assessment & Plan Note (Signed)
Well controlled, no changes to meds. Encouraged heart healthy diet such as the DASH diet and exercise as tolerated.  °

## 2021-12-08 ENCOUNTER — Other Ambulatory Visit: Payer: Self-pay | Admitting: Family Medicine

## 2021-12-08 DIAGNOSIS — E039 Hypothyroidism, unspecified: Secondary | ICD-10-CM

## 2021-12-13 DIAGNOSIS — N183 Chronic kidney disease, stage 3 unspecified: Secondary | ICD-10-CM | POA: Diagnosis not present

## 2021-12-19 DIAGNOSIS — J301 Allergic rhinitis due to pollen: Secondary | ICD-10-CM | POA: Diagnosis not present

## 2021-12-19 DIAGNOSIS — J3089 Other allergic rhinitis: Secondary | ICD-10-CM | POA: Diagnosis not present

## 2021-12-19 DIAGNOSIS — J3081 Allergic rhinitis due to animal (cat) (dog) hair and dander: Secondary | ICD-10-CM | POA: Diagnosis not present

## 2021-12-20 ENCOUNTER — Other Ambulatory Visit: Payer: Self-pay | Admitting: Family Medicine

## 2021-12-20 DIAGNOSIS — E1165 Type 2 diabetes mellitus with hyperglycemia: Secondary | ICD-10-CM

## 2022-01-16 DIAGNOSIS — J301 Allergic rhinitis due to pollen: Secondary | ICD-10-CM | POA: Diagnosis not present

## 2022-01-16 DIAGNOSIS — J3081 Allergic rhinitis due to animal (cat) (dog) hair and dander: Secondary | ICD-10-CM | POA: Diagnosis not present

## 2022-01-16 DIAGNOSIS — J3089 Other allergic rhinitis: Secondary | ICD-10-CM | POA: Diagnosis not present

## 2022-02-05 ENCOUNTER — Other Ambulatory Visit: Payer: Self-pay | Admitting: Family Medicine

## 2022-02-05 DIAGNOSIS — K219 Gastro-esophageal reflux disease without esophagitis: Secondary | ICD-10-CM

## 2022-02-05 DIAGNOSIS — E785 Hyperlipidemia, unspecified: Secondary | ICD-10-CM

## 2022-02-05 DIAGNOSIS — E1151 Type 2 diabetes mellitus with diabetic peripheral angiopathy without gangrene: Secondary | ICD-10-CM

## 2022-02-05 DIAGNOSIS — I10 Essential (primary) hypertension: Secondary | ICD-10-CM

## 2022-02-11 ENCOUNTER — Other Ambulatory Visit: Payer: Self-pay | Admitting: *Deleted

## 2022-02-11 DIAGNOSIS — M8589 Other specified disorders of bone density and structure, multiple sites: Secondary | ICD-10-CM

## 2022-02-11 MED ORDER — ALENDRONATE SODIUM 70 MG PO TABS: ORAL_TABLET | ORAL | 3 refills | Status: DC

## 2022-02-18 DIAGNOSIS — J301 Allergic rhinitis due to pollen: Secondary | ICD-10-CM | POA: Diagnosis not present

## 2022-02-18 DIAGNOSIS — J3081 Allergic rhinitis due to animal (cat) (dog) hair and dander: Secondary | ICD-10-CM | POA: Diagnosis not present

## 2022-02-18 DIAGNOSIS — J3089 Other allergic rhinitis: Secondary | ICD-10-CM | POA: Diagnosis not present

## 2022-02-25 DIAGNOSIS — J3089 Other allergic rhinitis: Secondary | ICD-10-CM | POA: Diagnosis not present

## 2022-02-25 DIAGNOSIS — J301 Allergic rhinitis due to pollen: Secondary | ICD-10-CM | POA: Diagnosis not present

## 2022-02-25 DIAGNOSIS — J3081 Allergic rhinitis due to animal (cat) (dog) hair and dander: Secondary | ICD-10-CM | POA: Diagnosis not present

## 2022-03-04 DIAGNOSIS — J3089 Other allergic rhinitis: Secondary | ICD-10-CM | POA: Diagnosis not present

## 2022-03-04 DIAGNOSIS — J301 Allergic rhinitis due to pollen: Secondary | ICD-10-CM | POA: Diagnosis not present

## 2022-03-04 DIAGNOSIS — J4520 Mild intermittent asthma, uncomplicated: Secondary | ICD-10-CM | POA: Diagnosis not present

## 2022-03-04 DIAGNOSIS — J3081 Allergic rhinitis due to animal (cat) (dog) hair and dander: Secondary | ICD-10-CM | POA: Diagnosis not present

## 2022-03-11 DIAGNOSIS — J3081 Allergic rhinitis due to animal (cat) (dog) hair and dander: Secondary | ICD-10-CM | POA: Diagnosis not present

## 2022-03-11 DIAGNOSIS — J301 Allergic rhinitis due to pollen: Secondary | ICD-10-CM | POA: Diagnosis not present

## 2022-03-11 DIAGNOSIS — J3089 Other allergic rhinitis: Secondary | ICD-10-CM | POA: Diagnosis not present

## 2022-03-25 DIAGNOSIS — J3089 Other allergic rhinitis: Secondary | ICD-10-CM | POA: Diagnosis not present

## 2022-03-25 DIAGNOSIS — J3081 Allergic rhinitis due to animal (cat) (dog) hair and dander: Secondary | ICD-10-CM | POA: Diagnosis not present

## 2022-03-25 DIAGNOSIS — J301 Allergic rhinitis due to pollen: Secondary | ICD-10-CM | POA: Diagnosis not present

## 2022-04-01 DIAGNOSIS — J3081 Allergic rhinitis due to animal (cat) (dog) hair and dander: Secondary | ICD-10-CM | POA: Diagnosis not present

## 2022-04-01 DIAGNOSIS — J3089 Other allergic rhinitis: Secondary | ICD-10-CM | POA: Diagnosis not present

## 2022-04-01 DIAGNOSIS — J301 Allergic rhinitis due to pollen: Secondary | ICD-10-CM | POA: Diagnosis not present

## 2022-04-29 DIAGNOSIS — N183 Chronic kidney disease, stage 3 unspecified: Secondary | ICD-10-CM | POA: Diagnosis not present

## 2022-05-06 DIAGNOSIS — H524 Presbyopia: Secondary | ICD-10-CM | POA: Diagnosis not present

## 2022-05-06 DIAGNOSIS — E119 Type 2 diabetes mellitus without complications: Secondary | ICD-10-CM | POA: Diagnosis not present

## 2022-05-06 LAB — HM DIABETES EYE EXAM

## 2022-05-07 DIAGNOSIS — K080 Exfoliation of teeth due to systemic causes: Secondary | ICD-10-CM | POA: Diagnosis not present

## 2022-05-09 DIAGNOSIS — D649 Anemia, unspecified: Secondary | ICD-10-CM | POA: Diagnosis not present

## 2022-05-09 DIAGNOSIS — N183 Chronic kidney disease, stage 3 unspecified: Secondary | ICD-10-CM | POA: Diagnosis not present

## 2022-05-09 DIAGNOSIS — E1122 Type 2 diabetes mellitus with diabetic chronic kidney disease: Secondary | ICD-10-CM | POA: Diagnosis not present

## 2022-05-09 DIAGNOSIS — I129 Hypertensive chronic kidney disease with stage 1 through stage 4 chronic kidney disease, or unspecified chronic kidney disease: Secondary | ICD-10-CM | POA: Diagnosis not present

## 2022-05-13 DIAGNOSIS — J3081 Allergic rhinitis due to animal (cat) (dog) hair and dander: Secondary | ICD-10-CM | POA: Diagnosis not present

## 2022-05-13 DIAGNOSIS — J3089 Other allergic rhinitis: Secondary | ICD-10-CM | POA: Diagnosis not present

## 2022-05-13 DIAGNOSIS — J301 Allergic rhinitis due to pollen: Secondary | ICD-10-CM | POA: Diagnosis not present

## 2022-05-23 DIAGNOSIS — Z1231 Encounter for screening mammogram for malignant neoplasm of breast: Secondary | ICD-10-CM | POA: Diagnosis not present

## 2022-05-23 LAB — HM MAMMOGRAPHY

## 2022-06-07 ENCOUNTER — Other Ambulatory Visit: Payer: Self-pay | Admitting: Family Medicine

## 2022-06-07 DIAGNOSIS — E1165 Type 2 diabetes mellitus with hyperglycemia: Secondary | ICD-10-CM

## 2022-06-11 DIAGNOSIS — J301 Allergic rhinitis due to pollen: Secondary | ICD-10-CM | POA: Diagnosis not present

## 2022-06-11 DIAGNOSIS — J3089 Other allergic rhinitis: Secondary | ICD-10-CM | POA: Diagnosis not present

## 2022-06-11 DIAGNOSIS — J3081 Allergic rhinitis due to animal (cat) (dog) hair and dander: Secondary | ICD-10-CM | POA: Diagnosis not present

## 2022-07-15 DIAGNOSIS — H35342 Macular cyst, hole, or pseudohole, left eye: Secondary | ICD-10-CM | POA: Diagnosis not present

## 2022-07-15 DIAGNOSIS — J3081 Allergic rhinitis due to animal (cat) (dog) hair and dander: Secondary | ICD-10-CM | POA: Diagnosis not present

## 2022-07-15 DIAGNOSIS — J3089 Other allergic rhinitis: Secondary | ICD-10-CM | POA: Diagnosis not present

## 2022-07-16 DIAGNOSIS — L570 Actinic keratosis: Secondary | ICD-10-CM | POA: Diagnosis not present

## 2022-07-16 DIAGNOSIS — L821 Other seborrheic keratosis: Secondary | ICD-10-CM | POA: Diagnosis not present

## 2022-07-16 DIAGNOSIS — L814 Other melanin hyperpigmentation: Secondary | ICD-10-CM | POA: Diagnosis not present

## 2022-07-16 DIAGNOSIS — D225 Melanocytic nevi of trunk: Secondary | ICD-10-CM | POA: Diagnosis not present

## 2022-07-16 DIAGNOSIS — D692 Other nonthrombocytopenic purpura: Secondary | ICD-10-CM | POA: Diagnosis not present

## 2022-07-18 DIAGNOSIS — J3089 Other allergic rhinitis: Secondary | ICD-10-CM | POA: Diagnosis not present

## 2022-07-18 DIAGNOSIS — J301 Allergic rhinitis due to pollen: Secondary | ICD-10-CM | POA: Diagnosis not present

## 2022-07-26 DIAGNOSIS — J3081 Allergic rhinitis due to animal (cat) (dog) hair and dander: Secondary | ICD-10-CM | POA: Diagnosis not present

## 2022-07-26 DIAGNOSIS — J3089 Other allergic rhinitis: Secondary | ICD-10-CM | POA: Diagnosis not present

## 2022-07-26 DIAGNOSIS — J301 Allergic rhinitis due to pollen: Secondary | ICD-10-CM | POA: Diagnosis not present

## 2022-07-29 DIAGNOSIS — H43393 Other vitreous opacities, bilateral: Secondary | ICD-10-CM | POA: Diagnosis not present

## 2022-07-29 DIAGNOSIS — H35371 Puckering of macula, right eye: Secondary | ICD-10-CM | POA: Diagnosis not present

## 2022-07-29 DIAGNOSIS — H35342 Macular cyst, hole, or pseudohole, left eye: Secondary | ICD-10-CM | POA: Diagnosis not present

## 2022-07-29 DIAGNOSIS — H35372 Puckering of macula, left eye: Secondary | ICD-10-CM | POA: Diagnosis not present

## 2022-07-29 DIAGNOSIS — H43813 Vitreous degeneration, bilateral: Secondary | ICD-10-CM | POA: Diagnosis not present

## 2022-08-02 DIAGNOSIS — J3081 Allergic rhinitis due to animal (cat) (dog) hair and dander: Secondary | ICD-10-CM | POA: Diagnosis not present

## 2022-08-02 DIAGNOSIS — J3089 Other allergic rhinitis: Secondary | ICD-10-CM | POA: Diagnosis not present

## 2022-08-02 DIAGNOSIS — J301 Allergic rhinitis due to pollen: Secondary | ICD-10-CM | POA: Diagnosis not present

## 2022-08-08 DIAGNOSIS — K080 Exfoliation of teeth due to systemic causes: Secondary | ICD-10-CM | POA: Diagnosis not present

## 2022-08-09 DIAGNOSIS — J3089 Other allergic rhinitis: Secondary | ICD-10-CM | POA: Diagnosis not present

## 2022-08-09 DIAGNOSIS — J3081 Allergic rhinitis due to animal (cat) (dog) hair and dander: Secondary | ICD-10-CM | POA: Diagnosis not present

## 2022-08-09 DIAGNOSIS — J301 Allergic rhinitis due to pollen: Secondary | ICD-10-CM | POA: Diagnosis not present

## 2022-08-14 DIAGNOSIS — H35372 Puckering of macula, left eye: Secondary | ICD-10-CM | POA: Diagnosis not present

## 2022-08-14 DIAGNOSIS — H35342 Macular cyst, hole, or pseudohole, left eye: Secondary | ICD-10-CM | POA: Diagnosis not present

## 2022-08-15 DIAGNOSIS — H35372 Puckering of macula, left eye: Secondary | ICD-10-CM | POA: Diagnosis not present

## 2022-08-15 DIAGNOSIS — Z9889 Other specified postprocedural states: Secondary | ICD-10-CM | POA: Diagnosis not present

## 2022-08-15 DIAGNOSIS — H35342 Macular cyst, hole, or pseudohole, left eye: Secondary | ICD-10-CM | POA: Diagnosis not present

## 2022-08-22 DIAGNOSIS — J301 Allergic rhinitis due to pollen: Secondary | ICD-10-CM | POA: Diagnosis not present

## 2022-08-22 DIAGNOSIS — H35342 Macular cyst, hole, or pseudohole, left eye: Secondary | ICD-10-CM | POA: Diagnosis not present

## 2022-08-22 DIAGNOSIS — Z9889 Other specified postprocedural states: Secondary | ICD-10-CM | POA: Diagnosis not present

## 2022-08-22 DIAGNOSIS — J3089 Other allergic rhinitis: Secondary | ICD-10-CM | POA: Diagnosis not present

## 2022-08-22 DIAGNOSIS — J3081 Allergic rhinitis due to animal (cat) (dog) hair and dander: Secondary | ICD-10-CM | POA: Diagnosis not present

## 2022-08-22 DIAGNOSIS — H35372 Puckering of macula, left eye: Secondary | ICD-10-CM | POA: Diagnosis not present

## 2022-08-30 DIAGNOSIS — J3081 Allergic rhinitis due to animal (cat) (dog) hair and dander: Secondary | ICD-10-CM | POA: Diagnosis not present

## 2022-08-30 DIAGNOSIS — J3089 Other allergic rhinitis: Secondary | ICD-10-CM | POA: Diagnosis not present

## 2022-09-03 ENCOUNTER — Other Ambulatory Visit: Payer: Self-pay | Admitting: Family Medicine

## 2022-09-03 DIAGNOSIS — E1165 Type 2 diabetes mellitus with hyperglycemia: Secondary | ICD-10-CM

## 2022-09-17 DIAGNOSIS — J301 Allergic rhinitis due to pollen: Secondary | ICD-10-CM | POA: Diagnosis not present

## 2022-09-17 DIAGNOSIS — J3089 Other allergic rhinitis: Secondary | ICD-10-CM | POA: Diagnosis not present

## 2022-09-17 DIAGNOSIS — J3081 Allergic rhinitis due to animal (cat) (dog) hair and dander: Secondary | ICD-10-CM | POA: Diagnosis not present

## 2022-09-19 DIAGNOSIS — H35372 Puckering of macula, left eye: Secondary | ICD-10-CM | POA: Diagnosis not present

## 2022-09-19 DIAGNOSIS — Z9889 Other specified postprocedural states: Secondary | ICD-10-CM | POA: Diagnosis not present

## 2022-09-19 DIAGNOSIS — H35342 Macular cyst, hole, or pseudohole, left eye: Secondary | ICD-10-CM | POA: Diagnosis not present

## 2022-10-23 DIAGNOSIS — J301 Allergic rhinitis due to pollen: Secondary | ICD-10-CM | POA: Diagnosis not present

## 2022-10-23 DIAGNOSIS — J3081 Allergic rhinitis due to animal (cat) (dog) hair and dander: Secondary | ICD-10-CM | POA: Diagnosis not present

## 2022-10-23 DIAGNOSIS — N183 Chronic kidney disease, stage 3 unspecified: Secondary | ICD-10-CM | POA: Diagnosis not present

## 2022-10-23 DIAGNOSIS — J3089 Other allergic rhinitis: Secondary | ICD-10-CM | POA: Diagnosis not present

## 2022-10-25 ENCOUNTER — Ambulatory Visit (INDEPENDENT_AMBULATORY_CARE_PROVIDER_SITE_OTHER): Payer: Medicare Other | Admitting: *Deleted

## 2022-10-25 VITALS — BP 144/76 | HR 82 | Ht 61.0 in | Wt 122.8 lb

## 2022-10-25 DIAGNOSIS — Z0000 Encounter for general adult medical examination without abnormal findings: Secondary | ICD-10-CM | POA: Diagnosis not present

## 2022-10-25 NOTE — Progress Notes (Signed)
Subjective:   Cheryl Cisneros is a 77 y.o. female who presents for Medicare Annual (Subsequent) preventive examination.  Visit Complete: In person   Review of Systems     Cardiac Risk Factors include: advanced age (>59men, >53 women);diabetes mellitus;dyslipidemia;hypertension     Objective:    Today's Vitals   10/25/22 0859 10/25/22 0913  BP: (!) 148/70 (!) 144/76  Pulse: 92 82  Weight: 122 lb 12.8 oz (55.7 kg)   Height: 5\' 1"  (1.549 m)    Body mass index is 23.2 kg/m.     10/25/2022    9:03 AM 10/22/2021    9:18 AM 10/22/2021    8:57 AM 10/16/2020    8:59 AM 08/19/2019    8:59 AM 08/18/2018    8:16 AM 08/15/2017    9:02 AM  Advanced Directives  Does Patient Have a Medical Advance Directive? Yes Yes No Yes Yes Yes Yes  Type of Estate agent of [place];Living will Healthcare Power of [place];Living will;Out of facility DNR (pink MOST or yellow form)  Healthcare Power of [place];Living will Healthcare Power of [place];Living will Healthcare Power of [place];Living will Healthcare Power of [place];Living will  Does patient want to make changes to medical advance directive? No - Patient declined No - Patient declined   No - Patient declined No - Patient declined No - Patient declined  Copy of Healthcare Power of Attorney in Chart? No - copy requested Yes - validated most recent copy scanned in chart (See row information)  No - copy requested No - copy requested No - copy requested No - copy requested  Would patient like information on creating a medical advance directive?  No - Patient declined No - Patient declined        Current Medications (verified) Outpatient Encounter Medications as of 10/25/2022  Medication Sig   alendronate (FOSAMAX) 70 MG tablet TAKE ONE TABLET BY MOUTH ONCE A WEEK IN THE MORNING WITH A FULL GLASS OF WATER, 30 MINUTES BEFORE A MEAL OR BEVERAGE. REMAIN UPRIGHT   aspirin EC 81 MG tablet Take 1 tablet (81 mg total) by mouth daily.    Biotin 1000 MCG tablet Take 5,000 mcg by mouth 2 (two) times daily.    Calcium-Vitamin D 600-200 MG-UNIT per tablet Take 1 tablet by mouth 3 (three) times daily with meals.   cetirizine (ZYRTEC) 10 MG tablet Take 1 tablet (10 mg total) by mouth daily.   Cholecalciferol (VITAMIN D3) 1000 UNITS CAPS Take 1 capsule by mouth daily.   CINNAMON PO Take by mouth. 500 mg- 3 per day with meals   CRANBERRY PO Take by mouth. 500 mg daily   EPIPEN 2-PAK 0.3 MG/0.3ML DEVI Reported on 07/26/2015   Ferrous Gluconate (IRON 27 PO) Take by mouth daily.   Garlic Oil 1000 MG CAPS Take 1 capsule by mouth daily.   glucose blood (ONETOUCH ULTRA) test strip USE  STRIP TO CHECK GLUCOSE TWICE DAILY   JARDIANCE 10 MG TABS tablet Take 10 mg by mouth daily.   L-Lysine 500 MG TABS Take 1 tablet by mouth daily.   levothyroxine (SYNTHROID) 100 MCG tablet Take 1 tablet (100 mcg total) by mouth daily before breakfast.   Magnesium 250 MG TABS Take 1 tablet by mouth daily.   metFORMIN (GLUCOPHAGE) 1000 MG tablet Take 1 tablet by mouth twice daily with meals   nateglinide (STARLIX) 120 MG tablet Take 1 tablet (120 mg total) by mouth 3 (three) times daily with meals.   NONFORMULARY  OR COMPOUNDED ITEM viviscal advanced hair health  1 po bid   olmesartan-hydrochlorothiazide (BENICAR HCT) 40-12.5 MG tablet Take 1 tablet by mouth once daily   Omega-3 Fatty Acids (OMEGA 3 PO) Take 2 tablets by mouth daily.   omeprazole (PRILOSEC) 40 MG capsule Take 1 capsule (40 mg total) by mouth daily.   OneTouch Delica Lancets 33G MISC USE   TO CHECK GLUCOSE TWICE DAILY   OVER THE COUNTER MEDICATION Maxi-vision 2 capsules per day   pravastatin (PRAVACHOL) 10 MG tablet Take 1 tablet (10 mg total) by mouth daily.   SELENIUM PO Take by mouth. 200 mcg daily   SIMPLY SALINE NA Place 1 spray into the nose daily.   Specialty Vitamins Products (HAIR NOURISHING SUPPLEMENT PO) Take by mouth. Per patient twice a day after meals.   Turmeric 500 MG CAPS Take  1 capsule by mouth 3 (three) times daily.    vitamin C (ASCORBIC ACID) 500 MG tablet Take 1,000 mg by mouth daily.   No facility-administered encounter medications on file as of 10/25/2022.    Allergies (verified) Losartan potassium, Metoprolol, and Norvasc [amlodipine besylate]   History: Past Medical History:  Diagnosis Date   Allergy    Arthritis    Asthma    Borderline glaucoma    Cataract    Chronic kidney disease    kidney stones   Diabetes (HCC)    GERD (gastroesophageal reflux disease)    Hypertension    Hypothyroidism 10/27/2006   Qualifier: Diagnosis of  By: Janit Bern     Macular degeneration 12/25/2011   Osteopenia    Thyroid disease    Hypothyroidism   Past Surgical History:  Procedure Laterality Date   ABDOMINAL HYSTERECTOMY  1986   TAH BSO   BACK SURGERY  2016   x3 total- 1999, 2016, 2017   bladder surgery     repair   CATARACT EXTRACTION Bilateral    COLONOSCOPY     EYE SURGERY Right 07/28/2017   Dr.Beavis. Cataract removal   KNEE ARTHROSCOPY Right 2002   LITHOTRIPSY     2004   LUMBAR LAMINECTOMY  1999   ROTATOR CUFF REPAIR Right 02/27/2005   TONSILLECTOMY  1963   Family History  Problem Relation Age of Onset   Diabetes Mother    Hypertension Mother    Kidney disease Mother        dialysis   Diabetes Father    Hypertension Father    Cancer Father        lung   Dementia Sister    Diabetes Sister    Hyperlipidemia Sister    Hypertension Sister    Diabetes Sister    Hypertension Sister    Hyperlipidemia Sister    Other Sister        blood disorder   Diabetes Brother    Hyperlipidemia Brother    Hypertension Brother    Cervical cancer Daughter    Cancer Daughter 8       cervical   Stomach cancer Other    Rectal cancer Other    Coronary artery disease Other    Lung cancer Other    Colon cancer Neg Hx    Esophageal cancer Neg Hx    Social History   Socioeconomic History   Marital status: Married    Spouse name: Not on  file   Number of children: Not on file   Years of education: Not on file   Highest education level: Not on file  Occupational History   Occupation: housewife    Associate Professor: UNEMPLOYED  Tobacco Use   Smoking status: Never   Smokeless tobacco: Never  Vaping Use   Vaping Use: Never used  Substance and Sexual Activity   Alcohol use: No    Alcohol/week: 0.0 standard drinks of alcohol   Drug use: No   Sexual activity: Not Currently    Partners: Male  Other Topics Concern   Not on file  Social History Narrative   Exercise--  bootcamp 3 days a week and walking   Social Determinants of Health   Financial Resource Strain: Low Risk  (10/25/2022)   Overall Financial Resource Strain (CARDIA)    Difficulty of Paying Living Expenses: Not hard at all  Food Insecurity: No Food Insecurity (10/25/2022)   Hunger Vital Sign    Worried About Running Out of Food in the Last Year: Never true    Ran Out of Food in the Last Year: Never true  Transportation Needs: No Transportation Needs (10/25/2022)   PRAPARE - Administrator, Civil Service (Medical): No    Lack of Transportation (Non-Medical): No  Physical Activity: Insufficiently Active (10/25/2022)   Exercise Vital Sign    Days of Exercise per Week: 2 days    Minutes of Exercise per Session: 60 min  Stress: No Stress Concern Present (10/25/2022)   [employer] of Occupational Health - Occupational Stress Questionnaire    Feeling of Stress : Not at all  Social Connections: Socially Integrated (10/25/2022)   Social Connection and Isolation Panel [NHANES]    Frequency of Communication with Friends and Family: More than three times a week    Frequency of Social Gatherings with Friends and Family: Twice a week    Attends Religious Services: More than 4 times per year    Active Member of [employer] or Organizations: Yes    Attends Engineer, structural: More than 4 times per year    Marital Status: Married    Tobacco  Counseling Counseling given: Not Answered   Clinical Intake:  Pre-visit preparation completed: Yes  Pain : No/denies pain     BMI - recorded: 23.2 Nutritional Status: BMI of 19-24  Normal Nutritional Risks: None Diabetes: Yes CBG done?: No Did pt. bring in CBG monitor from home?: No  How often do you need to have someone help you when you read instructions, pamphlets, or other written materials from your doctor or pharmacy?: 1 - Never  Interpreter Needed?: No  Information entered by :: Donne Anon, CMA   Activities of Daily Living    10/25/2022    9:03 AM  In your present state of health, do you have any difficulty performing the following activities:  Hearing? 0  Vision? 0  Difficulty concentrating or making decisions? 0  Walking or climbing stairs? 0  Dressing or bathing? 0  Doing errands, shopping? 0  Preparing Food and eating ? N  Using the Toilet? N  In the past six months, have you accidently leaked urine? N  Do you have problems with loss of bowel control? N  Managing your Medications? N  Managing your Finances? N  Housekeeping or managing your Housekeeping? N    Patient Care Team: Zola Button, Grayling Congress, DO as PCP - General Irena Cords, Enzo Montgomery, MD as Consulting Physician (Allergy and Immunology) Marlene Bast as Consulting Physician (Optometry) Anthony Sar, MD as Consulting Physician (Nephrology)  Indicate any recent Medical Services you may have received from other than  [facility] providers in the past year (date may be approximate).     Assessment:   This is a routine wellness examination for [place].  Hearing/Vision screen No results found.  Dietary issues and exercise activities discussed:     Goals Addressed   None    Depression Screen    10/25/2022    9:09 AM 10/22/2021    8:58 AM 10/16/2020    9:00 AM 09/19/2020    9:00 AM 08/19/2019    9:01 AM 08/18/2018    8:17 AM 08/15/2017    9:03 AM  PHQ 2/9 Scores  PHQ - 2 Score 0 0 0 0 0 0 0     Fall Risk    10/25/2022    9:06 AM 10/22/2021    8:58 AM 10/16/2020    9:00 AM 09/19/2020    9:01 AM 08/19/2019    9:00 AM  Fall Risk   Falls in the past year? 0 0 0 0 0  Number falls in past yr: 0 0 0 0 0  Injury with Fall? 0 0 0 0 0  Risk for fall due to : No Fall Risks No Fall Risks     Follow up Falls evaluation completed Falls evaluation completed Falls prevention discussed Falls evaluation completed Education provided;Falls prevention discussed    MEDICARE RISK AT HOME:  Medicare Risk at Home - 10/25/22 0905     Any stairs in or around the home? Yes    If so, are there any without handrails? No    Home free of loose throw rugs in walkways, pet beds, electrical cords, etc? Yes    Adequate lighting in your home to reduce risk of falls? Yes    Life alert? No    Use of a cane, walker or w/c? No    Grab bars in the bathroom? Yes    Shower chair or bench in shower? No    Elevated toilet seat or a handicapped toilet? No             TIMED UP AND GO:  Was the test performed?  Yes  Length of time to ambulate 10 feet: 6 sec Gait steady and fast without use of assistive device    Cognitive Function:        10/25/2022    9:09 AM 10/22/2021    9:08 AM  6CIT Screen  What Year? 0 points 0 points  What month? 0 points 0 points  What time? 0 points 0 points  Count back from 20 0 points 0 points  Months in reverse 0 points 0 points  Repeat phrase 0 points 2 points  Total Score 0 points 2 points    Immunizations Immunization History  Administered Date(s) Administered   Fluad Quad(high Dose 65+) 12/09/2018, 01/21/2022   Hep A / Hep B 12/09/2018, 01/12/2019   Hepatitis A 07/30/2019   Hepatitis B 07/30/2019   Influenza Split 02/12/2011   Influenza Whole 02/01/2009   Influenza, High Dose Seasonal PF 02/06/2015, 01/04/2016, 03/04/2016, 03/28/2017, 03/31/2018, 04/01/2019, 02/23/2020, 02/22/2021   Influenza,inj,Quad PF,6+ Mos 12/28/2013   Influenza-Unspecified 01/23/2018,  12/30/2019, 01/31/2021   PFIZER(Purple Top)SARS-COV-2 Vaccination 06/14/2019, 07/05/2019, 01/27/2020, 02/23/2020   PNEUMOCOCCAL CONJUGATE-20 06/29/2020   Pneumococcal Conjugate-13 06/24/2013, 12/09/2018   Pneumococcal Polysaccharide-23 12/29/2007, 12/13/2008, 04/14/2014, 03/04/2016, 03/28/2017, 03/31/2018, 04/01/2019, 09/13/2019, 02/23/2020, 02/22/2021   Respiratory Syncytial Virus Vaccine,Recomb Aduvanted(Arexvy) 01/21/2022   Td 09/16/2001   Tdap 06/28/2013   Zoster Recombinant(Shingrix) 11/12/2017, 01/23/2018, 07/12/2021   Zoster, Live 11/12/2007    TDAP  status: Up to date  Flu Vaccine status: Up to date  Pneumococcal vaccine status: Up to date  Covid-19 vaccine status: Information provided on how to obtain vaccines.   Qualifies for Shingles Vaccine? Yes   Zostavax completed Yes   Shingrix Completed?: Yes  Screening Tests Health Maintenance  Topic Date Due   COVID-19 Vaccine (5 - 2023-24 season) 12/21/2021   FOOT EXAM  03/26/2022   OPHTHALMOLOGY EXAM  05/23/2022   HEMOGLOBIN A1C  06/06/2022   INFLUENZA VACCINE  11/21/2022   Diabetic kidney evaluation - eGFR measurement  12/05/2022   Diabetic kidney evaluation - Urine ACR  12/05/2022   DEXA SCAN  05/18/2023   MAMMOGRAM  05/24/2023   DTaP/Tdap/Td (3 - Td or Tdap) 06/29/2023   Medicare Annual Wellness (AWV)  10/25/2023   Pneumonia Vaccine 85+ Years old  Completed   Hepatitis C Screening  Completed   Zoster Vaccines- Shingrix  Completed   HPV VACCINES  Aged Out   Colonoscopy  Discontinued    Health Maintenance  Health Maintenance Due  Topic Date Due   COVID-19 Vaccine (5 - 2023-24 season) 12/21/2021   FOOT EXAM  03/26/2022   OPHTHALMOLOGY EXAM  05/23/2022   HEMOGLOBIN A1C  06/06/2022    Colorectal cancer screening: No longer required.   Mammogram status: Completed 05/23/22. Repeat every year  Bone Density status: Completed 05/17/21. Results reflect: Bone density results: NORMAL. Repeat every 2 years.  Lung  Cancer Screening: (Low Dose CT Chest recommended if Age 82-80 years, 20 pack-year currently smoking OR have quit w/in 15years.) does not qualify.   Additional Screening:  Hepatitis C Screening: does qualify; Completed 03/02/15  Vision Screening: Recommended annual ophthalmology exams for early detection of glaucoma and other disorders of the eye. Is the patient up to date with their annual eye exam?  Yes  Who is the provider or what is the name of the office in which the patient attends annual eye exams? Dr. Ophelia Charter If pt is not established with a provider, would they like to be referred to a provider to establish care? No .   Dental Screening: Recommended annual dental exams for proper oral hygiene  Diabetic Foot Exam: Diabetic Foot Exam: Overdue, Pt has been advised about the importance in completing this exam. Pt is scheduled for diabetic foot exam on N/a.  Community Resource Referral / Chronic Care Management: CRR required this visit?  No   CCM required this visit?  No     Plan:     I have personally reviewed and noted the following in the patient's chart:   Medical and social history Use of alcohol, tobacco or illicit drugs  Current medications and supplements including opioid prescriptions. Patient is not currently taking opioid prescriptions. Functional ability and status Nutritional status Physical activity Advanced directives List of other physicians Hospitalizations, surgeries, and ER visits in previous 12 months Vitals Screenings to include cognitive, depression, and falls Referrals and appointments  In addition, I have reviewed and discussed with patient certain preventive protocols, quality metrics, and best practice recommendations. A written personalized care plan for preventive services as well as general preventive health recommendations were provided to patient.     Donne Anon, CMA   10/25/2022   After Visit Summary: Sent to mychart  Nurse  Notes: None

## 2022-10-25 NOTE — Patient Instructions (Signed)
Cheryl Cisneros , Thank you for taking time to come for your Medicare Wellness Visit. I appreciate your ongoing commitment to your health goals. Please review the following plan we discussed and let me know if I can assist you in the future.     This is a list of the screening recommended for you and due dates:  Health Maintenance  Topic Date Due   COVID-19 Vaccine (5 - 2023-24 season) 12/21/2021   Complete foot exam   03/26/2022   Eye exam for diabetics  05/23/2022   Hemoglobin A1C  06/06/2022   Flu Shot  11/21/2022   Yearly kidney function blood test for diabetes  12/05/2022   Yearly kidney health urinalysis for diabetes  12/05/2022   DEXA scan (bone density measurement)  05/18/2023   Mammogram  05/24/2023   DTaP/Tdap/Td vaccine (3 - Td or Tdap) 06/29/2023   Medicare Annual Wellness Visit  10/25/2023   Pneumonia Vaccine  Completed   Hepatitis C Screening  Completed   Zoster (Shingles) Vaccine  Completed   HPV Vaccine  Aged Out   Colon Cancer Screening  Discontinued     Next appointment: Follow up in one year for your annual wellness visit    Preventive Care 65 Years and Older, Female Preventive care refers to lifestyle choices and visits with your health care provider that can promote health and wellness. What does preventive care include? A yearly physical exam. This is also called an annual well check. Dental exams once or twice a year. Routine eye exams. Ask your health care provider how often you should have your eyes checked. Personal lifestyle choices, including: Daily care of your teeth and gums. Regular physical activity. Eating a healthy diet. Avoiding tobacco and drug use. Limiting alcohol use. Practicing safe sex. Taking low-dose aspirin every day. Taking vitamin and mineral supplements as recommended by your health care provider. What happens during an annual well check? The services and screenings done by your health care provider during your annual well check  will depend on your age, overall health, lifestyle risk factors, and family history of disease. Counseling  Your health care provider may ask you questions about your: Alcohol use. Tobacco use. Drug use. Emotional well-being. Home and relationship well-being. Sexual activity. Eating habits. History of falls. Memory and ability to understand (cognition). Work and work Astronomer. Reproductive health. Screening  You may have the following tests or measurements: Height, weight, and BMI. Blood pressure. Lipid and cholesterol levels. These may be checked every 5 years, or more frequently if you are over 35 years old. Skin check. Lung cancer screening. You may have this screening every year starting at age 10 if you have a 30-pack-year history of smoking and currently smoke or have quit within the past 15 years. Fecal occult blood test (FOBT) of the stool. You may have this test every year starting at age 69. Flexible sigmoidoscopy or colonoscopy. You may have a sigmoidoscopy every 5 years or a colonoscopy every 10 years starting at age 25. Hepatitis C blood test. Hepatitis B blood test. Sexually transmitted disease (STD) testing. Diabetes screening. This is done by checking your blood sugar (glucose) after you have not eaten for a while (fasting). You may have this done every 1-3 years. Bone density scan. This is done to screen for osteoporosis. You may have this done starting at age 60. Mammogram. This may be done every 1-2 years. Talk to your health care provider about how often you should have regular mammograms. Talk with  your health care provider about your test results, treatment options, and if necessary, the need for more tests. Vaccines  Your health care provider may recommend certain vaccines, such as: Influenza vaccine. This is recommended every year. Tetanus, diphtheria, and acellular pertussis (Tdap, Td) vaccine. You may need a Td booster every 10 years. Zoster vaccine. You  may need this after age 54. Pneumococcal 13-valent conjugate (PCV13) vaccine. One dose is recommended after age 47. Pneumococcal polysaccharide (PPSV23) vaccine. One dose is recommended after age 21. Talk to your health care provider about which screenings and vaccines you need and how often you need them. This information is not intended to replace advice given to you by your health care provider. Make sure you discuss any questions you have with your health care provider. Document Released: 05/05/2015 Document Revised: 12/27/2015 Document Reviewed: 02/07/2015 Elsevier Interactive Patient Education  2017 [employer].  Fall Prevention in the Home Falls can cause injuries. They can happen to people of all ages. There are many things you can do to make your home safe and to help prevent falls. What can I do on the outside of my home? Regularly fix the edges of walkways and driveways and fix any cracks. Remove anything that might make you trip as you walk through a door, such as a raised step or threshold. Trim any bushes or trees on the path to your home. Use bright outdoor lighting. Clear any walking paths of anything that might make someone trip, such as rocks or tools. Regularly check to see if handrails are loose or broken. Make sure that both sides of any steps have handrails. Any raised decks and porches should have guardrails on the edges. Have any leaves, snow, or ice cleared regularly. Use sand or salt on walking paths during winter. Clean up any spills in your garage right away. This includes oil or grease spills. What can I do in the bathroom? Use night lights. Install grab bars by the toilet and in the tub and shower. Do not use towel bars as grab bars. Use non-skid mats or decals in the tub or shower. If you need to sit down in the shower, use a plastic, non-slip stool. Keep the floor dry. Clean up any water that spills on the floor as soon as it happens. Remove soap buildup  in the tub or shower regularly. Attach bath mats securely with double-sided non-slip rug tape. Do not have throw rugs and other things on the floor that can make you trip. What can I do in the bedroom? Use night lights. Make sure that you have a light by your bed that is easy to reach. Do not use any sheets or blankets that are too big for your bed. They should not hang down onto the floor. Have a firm chair that has side arms. You can use this for support while you get dressed. Do not have throw rugs and other things on the floor that can make you trip. What can I do in the kitchen? Clean up any spills right away. Avoid walking on wet floors. Keep items that you use a lot in easy-to-reach places. If you need to reach something above you, use a strong step stool that has a grab bar. Keep electrical cords out of the way. Do not use floor polish or wax that makes floors slippery. If you must use wax, use non-skid floor wax. Do not have throw rugs and other things on the floor that can make you trip.  What can I do with my stairs? Do not leave any items on the stairs. Make sure that there are handrails on both sides of the stairs and use them. Fix handrails that are broken or loose. Make sure that handrails are as long as the stairways. Check any carpeting to make sure that it is firmly attached to the stairs. Fix any carpet that is loose or worn. Avoid having throw rugs at the top or bottom of the stairs. If you do have throw rugs, attach them to the floor with carpet tape. Make sure that you have a light switch at the top of the stairs and the bottom of the stairs. If you do not have them, ask someone to add them for you. What else can I do to help prevent falls? Wear shoes that: Do not have high heels. Have rubber bottoms. Are comfortable and fit you well. Are closed at the toe. Do not wear sandals. If you use a stepladder: Make sure that it is fully opened. Do not climb a closed  stepladder. Make sure that both sides of the stepladder are locked into place. Ask someone to hold it for you, if possible. Clearly mark and make sure that you can see: Any grab bars or handrails. First and last steps. Where the edge of each step is. Use tools that help you move around (mobility aids) if they are needed. These include: Canes. Walkers. Scooters. Crutches. Turn on the lights when you go into a dark area. Replace any light bulbs as soon as they burn out. Set up your furniture so you have a clear path. Avoid moving your furniture around. If any of your floors are uneven, fix them. If there are any pets around you, be aware of where they are. Review your medicines with your doctor. Some medicines can make you feel dizzy. This can increase your chance of falling. Ask your doctor what other things that you can do to help prevent falls. This information is not intended to replace advice given to you by your health care provider. Make sure you discuss any questions you have with your health care provider. Document Released: 02/02/2009 Document Revised: 09/14/2015 Document Reviewed: 05/13/2014 Elsevier Interactive Patient Education  2017 [employer].

## 2022-10-28 DIAGNOSIS — J3089 Other allergic rhinitis: Secondary | ICD-10-CM | POA: Diagnosis not present

## 2022-10-28 DIAGNOSIS — J3081 Allergic rhinitis due to animal (cat) (dog) hair and dander: Secondary | ICD-10-CM | POA: Diagnosis not present

## 2022-10-28 DIAGNOSIS — J301 Allergic rhinitis due to pollen: Secondary | ICD-10-CM | POA: Diagnosis not present

## 2022-10-31 ENCOUNTER — Other Ambulatory Visit: Payer: Self-pay | Admitting: Family Medicine

## 2022-11-01 DIAGNOSIS — I129 Hypertensive chronic kidney disease with stage 1 through stage 4 chronic kidney disease, or unspecified chronic kidney disease: Secondary | ICD-10-CM | POA: Diagnosis not present

## 2022-11-01 DIAGNOSIS — D631 Anemia in chronic kidney disease: Secondary | ICD-10-CM | POA: Diagnosis not present

## 2022-11-01 DIAGNOSIS — E1122 Type 2 diabetes mellitus with diabetic chronic kidney disease: Secondary | ICD-10-CM | POA: Diagnosis not present

## 2022-11-01 DIAGNOSIS — N183 Chronic kidney disease, stage 3 unspecified: Secondary | ICD-10-CM | POA: Diagnosis not present

## 2022-11-04 DIAGNOSIS — J3089 Other allergic rhinitis: Secondary | ICD-10-CM | POA: Diagnosis not present

## 2022-11-04 DIAGNOSIS — J301 Allergic rhinitis due to pollen: Secondary | ICD-10-CM | POA: Diagnosis not present

## 2022-11-04 DIAGNOSIS — J3081 Allergic rhinitis due to animal (cat) (dog) hair and dander: Secondary | ICD-10-CM | POA: Diagnosis not present

## 2022-11-11 DIAGNOSIS — J301 Allergic rhinitis due to pollen: Secondary | ICD-10-CM | POA: Diagnosis not present

## 2022-11-11 DIAGNOSIS — J3081 Allergic rhinitis due to animal (cat) (dog) hair and dander: Secondary | ICD-10-CM | POA: Diagnosis not present

## 2022-11-11 DIAGNOSIS — J3089 Other allergic rhinitis: Secondary | ICD-10-CM | POA: Diagnosis not present

## 2022-11-18 DIAGNOSIS — J3081 Allergic rhinitis due to animal (cat) (dog) hair and dander: Secondary | ICD-10-CM | POA: Diagnosis not present

## 2022-11-18 DIAGNOSIS — J3089 Other allergic rhinitis: Secondary | ICD-10-CM | POA: Diagnosis not present

## 2022-11-18 DIAGNOSIS — J301 Allergic rhinitis due to pollen: Secondary | ICD-10-CM | POA: Diagnosis not present

## 2022-11-25 DIAGNOSIS — J3081 Allergic rhinitis due to animal (cat) (dog) hair and dander: Secondary | ICD-10-CM | POA: Diagnosis not present

## 2022-11-25 DIAGNOSIS — J3089 Other allergic rhinitis: Secondary | ICD-10-CM | POA: Diagnosis not present

## 2022-11-25 DIAGNOSIS — J301 Allergic rhinitis due to pollen: Secondary | ICD-10-CM | POA: Diagnosis not present

## 2022-12-06 ENCOUNTER — Other Ambulatory Visit: Payer: Self-pay | Admitting: Family Medicine

## 2022-12-10 IMAGING — DX DG SHOULDER 2+V*R*
3 series · 3 of 3 positions shown · non-contrast
Comparison: February 27, 2005

CLINICAL DATA: Chronic right shoulder pain.

EXAM:
RIGHT SHOULDER - 2+ VIEW

[shoulder grashey]
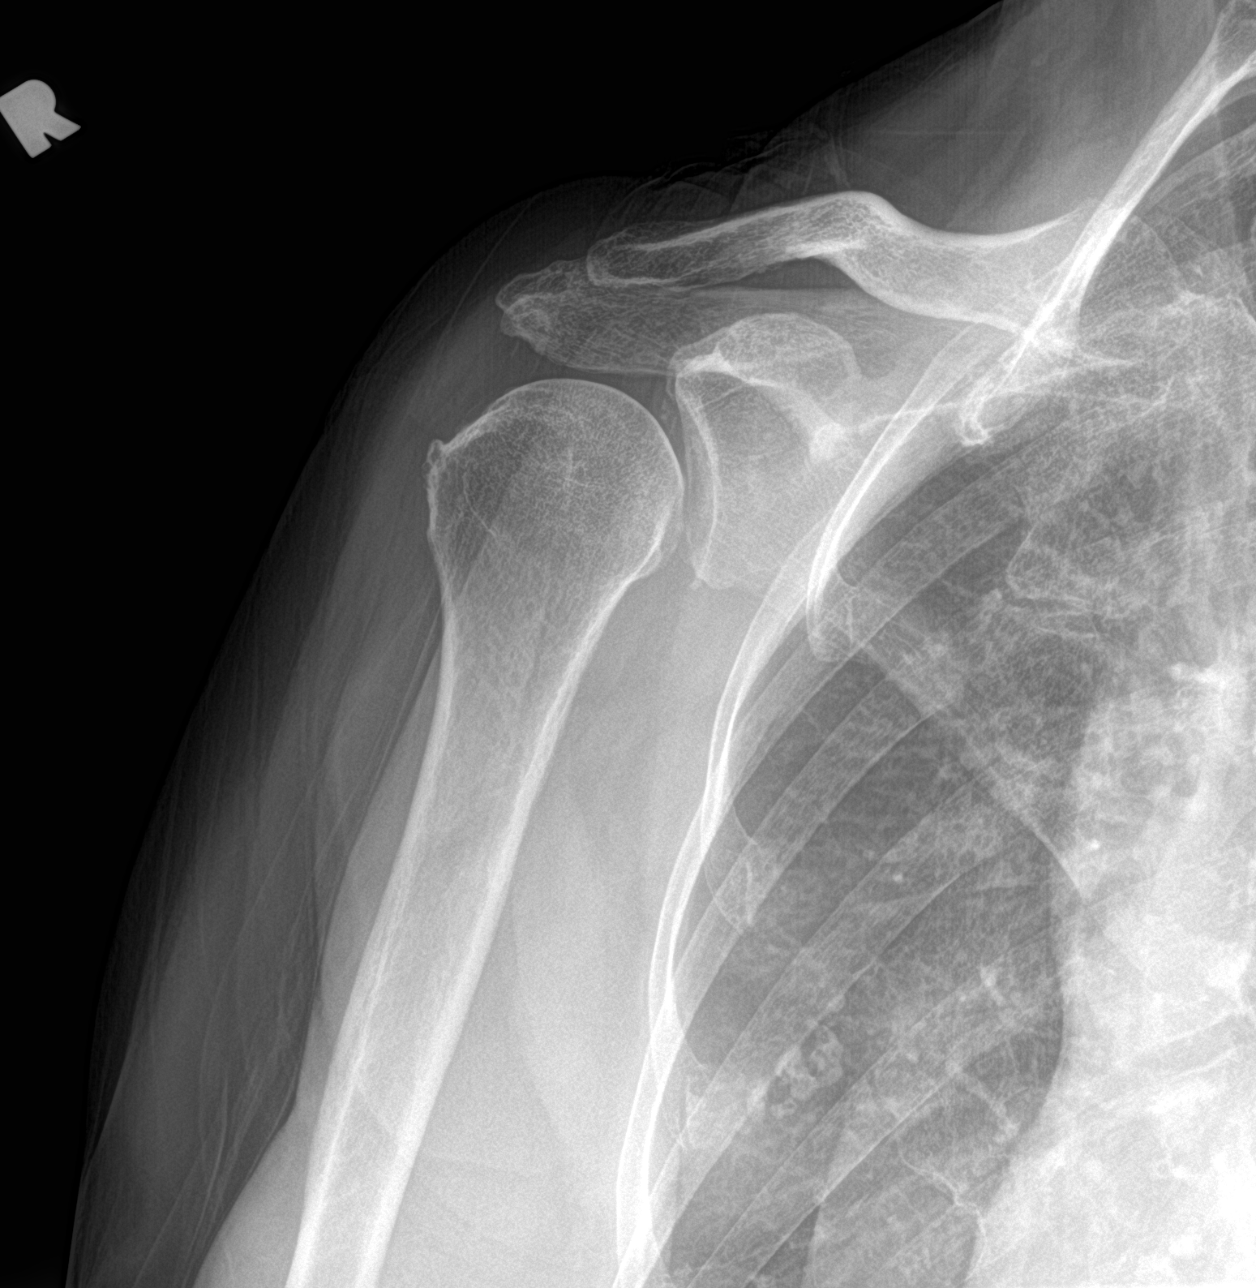

[shoulder y view]
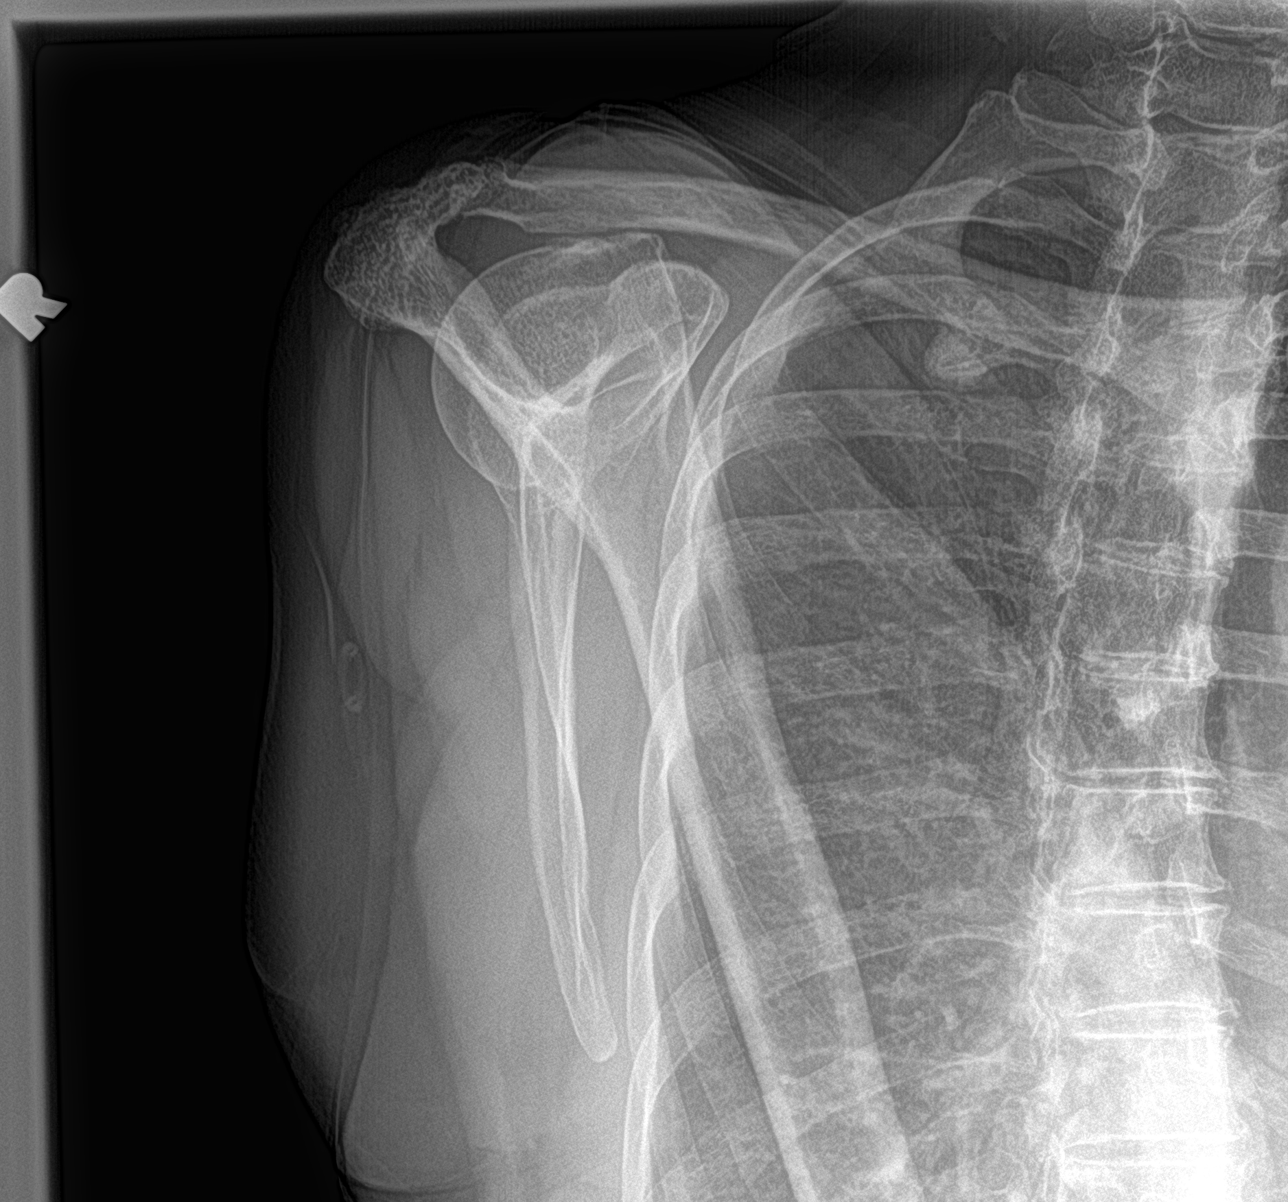

[shoulder axillary]
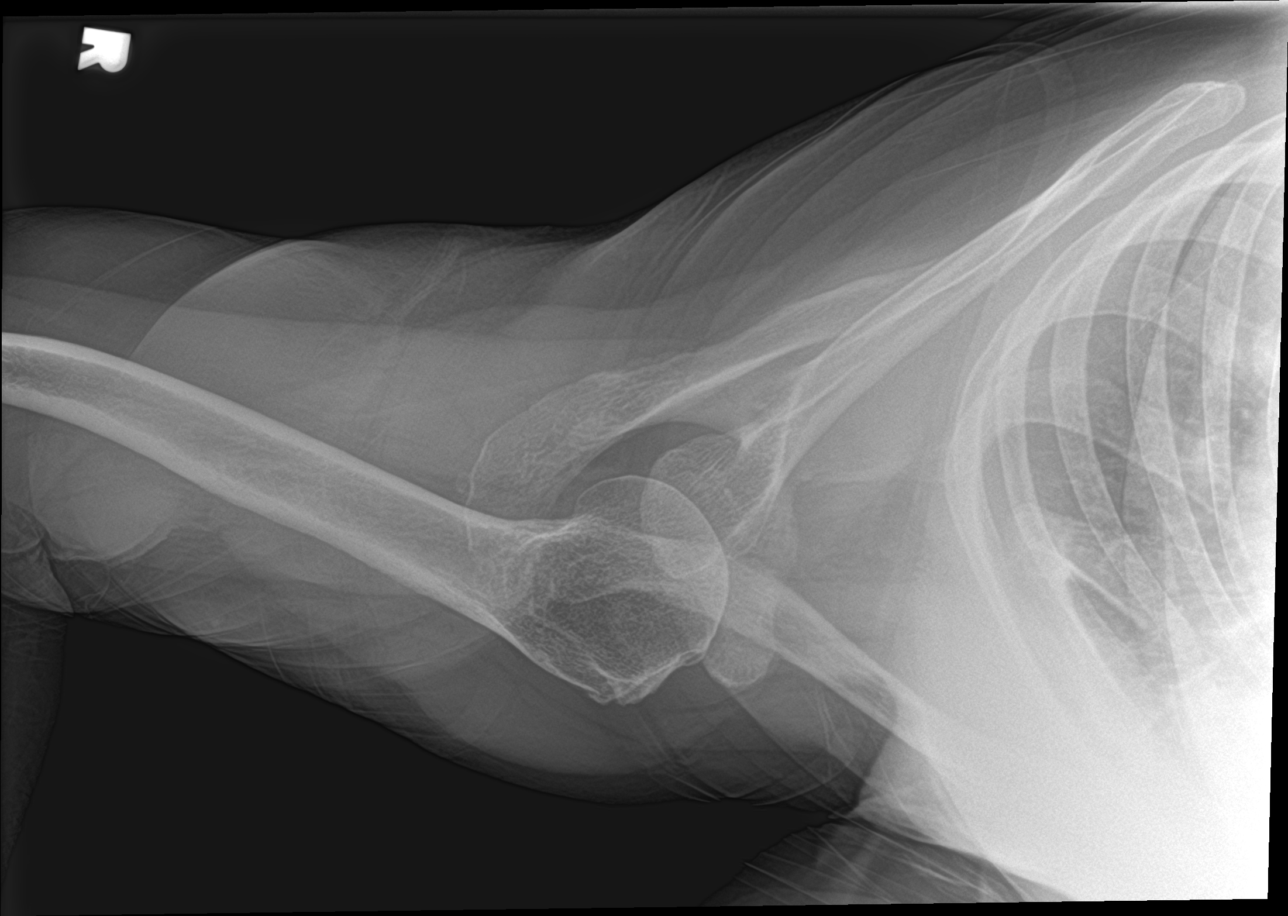

[3 of 3 positions shown; findings below may reference images not displayed]

FINDINGS: There is no evidence of fracture or dislocation. Mild degenerative
changes are seen involving the right acromioclavicular joint and
right glenohumeral articulation. Soft tissues are unremarkable.
IMPRESSION: Mild degenerative changes without an acute osseous abnormality.

## 2022-12-12 ENCOUNTER — Other Ambulatory Visit: Payer: Self-pay | Admitting: Family Medicine

## 2022-12-12 DIAGNOSIS — E039 Hypothyroidism, unspecified: Secondary | ICD-10-CM

## 2022-12-24 DIAGNOSIS — J3089 Other allergic rhinitis: Secondary | ICD-10-CM | POA: Diagnosis not present

## 2022-12-24 DIAGNOSIS — J3081 Allergic rhinitis due to animal (cat) (dog) hair and dander: Secondary | ICD-10-CM | POA: Diagnosis not present

## 2022-12-24 DIAGNOSIS — J301 Allergic rhinitis due to pollen: Secondary | ICD-10-CM | POA: Diagnosis not present

## 2022-12-26 DIAGNOSIS — H43391 Other vitreous opacities, right eye: Secondary | ICD-10-CM | POA: Diagnosis not present

## 2022-12-26 DIAGNOSIS — H35033 Hypertensive retinopathy, bilateral: Secondary | ICD-10-CM | POA: Diagnosis not present

## 2022-12-26 DIAGNOSIS — H35371 Puckering of macula, right eye: Secondary | ICD-10-CM | POA: Diagnosis not present

## 2022-12-26 DIAGNOSIS — H43811 Vitreous degeneration, right eye: Secondary | ICD-10-CM | POA: Diagnosis not present

## 2023-01-03 ENCOUNTER — Other Ambulatory Visit: Payer: Self-pay | Admitting: Family Medicine

## 2023-01-03 DIAGNOSIS — I10 Essential (primary) hypertension: Secondary | ICD-10-CM

## 2023-01-03 DIAGNOSIS — E1151 Type 2 diabetes mellitus with diabetic peripheral angiopathy without gangrene: Secondary | ICD-10-CM

## 2023-01-03 DIAGNOSIS — E785 Hyperlipidemia, unspecified: Secondary | ICD-10-CM

## 2023-01-03 DIAGNOSIS — K219 Gastro-esophageal reflux disease without esophagitis: Secondary | ICD-10-CM

## 2023-01-06 ENCOUNTER — Other Ambulatory Visit: Payer: Self-pay | Admitting: Family Medicine

## 2023-01-06 DIAGNOSIS — E039 Hypothyroidism, unspecified: Secondary | ICD-10-CM

## 2023-01-22 DIAGNOSIS — J301 Allergic rhinitis due to pollen: Secondary | ICD-10-CM | POA: Diagnosis not present

## 2023-01-22 DIAGNOSIS — J3089 Other allergic rhinitis: Secondary | ICD-10-CM | POA: Diagnosis not present

## 2023-01-22 DIAGNOSIS — J3081 Allergic rhinitis due to animal (cat) (dog) hair and dander: Secondary | ICD-10-CM | POA: Diagnosis not present

## 2023-02-06 ENCOUNTER — Other Ambulatory Visit: Payer: Self-pay | Admitting: Family Medicine

## 2023-02-13 DIAGNOSIS — K080 Exfoliation of teeth due to systemic causes: Secondary | ICD-10-CM | POA: Diagnosis not present

## 2023-02-18 DIAGNOSIS — N1831 Chronic kidney disease, stage 3a: Secondary | ICD-10-CM | POA: Diagnosis not present

## 2023-02-24 DIAGNOSIS — J3089 Other allergic rhinitis: Secondary | ICD-10-CM | POA: Diagnosis not present

## 2023-02-24 DIAGNOSIS — J301 Allergic rhinitis due to pollen: Secondary | ICD-10-CM | POA: Diagnosis not present

## 2023-02-24 DIAGNOSIS — J3081 Allergic rhinitis due to animal (cat) (dog) hair and dander: Secondary | ICD-10-CM | POA: Diagnosis not present

## 2023-02-28 ENCOUNTER — Other Ambulatory Visit: Payer: Self-pay | Admitting: Family Medicine

## 2023-02-28 DIAGNOSIS — I129 Hypertensive chronic kidney disease with stage 1 through stage 4 chronic kidney disease, or unspecified chronic kidney disease: Secondary | ICD-10-CM | POA: Diagnosis not present

## 2023-02-28 DIAGNOSIS — N183 Chronic kidney disease, stage 3 unspecified: Secondary | ICD-10-CM | POA: Diagnosis not present

## 2023-02-28 DIAGNOSIS — E1122 Type 2 diabetes mellitus with diabetic chronic kidney disease: Secondary | ICD-10-CM | POA: Diagnosis not present

## 2023-02-28 DIAGNOSIS — D631 Anemia in chronic kidney disease: Secondary | ICD-10-CM | POA: Diagnosis not present

## 2023-02-28 DIAGNOSIS — E1165 Type 2 diabetes mellitus with hyperglycemia: Secondary | ICD-10-CM

## 2023-03-04 DIAGNOSIS — J3089 Other allergic rhinitis: Secondary | ICD-10-CM | POA: Diagnosis not present

## 2023-03-04 DIAGNOSIS — J301 Allergic rhinitis due to pollen: Secondary | ICD-10-CM | POA: Diagnosis not present

## 2023-03-04 DIAGNOSIS — J4520 Mild intermittent asthma, uncomplicated: Secondary | ICD-10-CM | POA: Diagnosis not present

## 2023-03-04 DIAGNOSIS — J3081 Allergic rhinitis due to animal (cat) (dog) hair and dander: Secondary | ICD-10-CM | POA: Diagnosis not present

## 2023-03-13 DIAGNOSIS — N183 Chronic kidney disease, stage 3 unspecified: Secondary | ICD-10-CM | POA: Diagnosis not present

## 2023-03-26 DIAGNOSIS — J301 Allergic rhinitis due to pollen: Secondary | ICD-10-CM | POA: Diagnosis not present

## 2023-03-26 DIAGNOSIS — J3089 Other allergic rhinitis: Secondary | ICD-10-CM | POA: Diagnosis not present

## 2023-03-26 DIAGNOSIS — J3081 Allergic rhinitis due to animal (cat) (dog) hair and dander: Secondary | ICD-10-CM | POA: Diagnosis not present

## 2023-04-05 ENCOUNTER — Other Ambulatory Visit: Payer: Self-pay | Admitting: Family Medicine

## 2023-04-05 DIAGNOSIS — K219 Gastro-esophageal reflux disease without esophagitis: Secondary | ICD-10-CM

## 2023-04-24 DIAGNOSIS — J3081 Allergic rhinitis due to animal (cat) (dog) hair and dander: Secondary | ICD-10-CM | POA: Diagnosis not present

## 2023-04-24 DIAGNOSIS — J3089 Other allergic rhinitis: Secondary | ICD-10-CM | POA: Diagnosis not present

## 2023-04-24 DIAGNOSIS — J301 Allergic rhinitis due to pollen: Secondary | ICD-10-CM | POA: Diagnosis not present

## 2023-04-26 ENCOUNTER — Other Ambulatory Visit: Payer: Self-pay | Admitting: Family Medicine

## 2023-04-26 DIAGNOSIS — E039 Hypothyroidism, unspecified: Secondary | ICD-10-CM

## 2023-04-29 ENCOUNTER — Telehealth: Payer: Self-pay

## 2023-04-29 DIAGNOSIS — E039 Hypothyroidism, unspecified: Secondary | ICD-10-CM

## 2023-04-29 NOTE — Telephone Encounter (Signed)
 Appt scheduled w/ PCP on 05/02/23

## 2023-04-29 NOTE — Telephone Encounter (Signed)
 Copied from CRM [id number]. Topic: Clinical - Medication Refill >> Apr 29, 2023  1:48 PM Burnard DEL wrote: Most Recent Primary Care Visit:  Provider: KANDIS JEOFFREY CROME  Department: [facility]  Visit Type: MEDICARE AWV, SEQUENTIAL  Date: 10/25/2022  Medication: glucose blood (ONETOUCH ULTRA) test strip  Has the patient contacted their pharmacy? Yes (Agent: If no, request that the patient contact the pharmacy for the refill. If patient does not wish to contact the pharmacy document the reason why and proceed with request.) (Agent: If yes, when and what did the pharmacy advise?)  Is this the correct pharmacy for this prescription? Yes If no, delete pharmacy and type the correct one.  This is the patient's preferred pharmacy:  [facility] [address] [place] - [address] [place] [address] [address] Phone: [phone] Fax: [phone]     Has the prescription been filled recently? No  Is the patient out of the medication? No  Has the patient been seen for an appointment in the last year OR does the patient have an upcoming appointment? Yes  Can we respond through MyChart? Yes  Agent: Please be advised that Rx refills may take up to 3 business days. We ask that you follow-up with your pharmacy.

## 2023-04-29 NOTE — Telephone Encounter (Signed)
 Copied from CRM [id number]. Topic: Clinical - Medication Question >> Apr 29, 2023  1:50 PM Burnard DEL wrote: Reason for CRM: patient was advised by pharmacy that her refill request for medication levothyroxine  (SYNTHROID ) 100 MCG tablet was denied by provider and advised her to call office.She would like to know they reason of this denial

## 2023-04-29 NOTE — Telephone Encounter (Signed)
 Pt's last appt was 12/04/2021- will need appt for refills please.

## 2023-05-02 ENCOUNTER — Encounter: Payer: Self-pay | Admitting: Family Medicine

## 2023-05-02 ENCOUNTER — Ambulatory Visit (INDEPENDENT_AMBULATORY_CARE_PROVIDER_SITE_OTHER): Payer: Medicare Other | Admitting: Family Medicine

## 2023-05-02 VITALS — BP 150/70 | HR 59 | Temp 97.7°F | Resp 18 | Ht 61.0 in | Wt 120.8 lb

## 2023-05-02 DIAGNOSIS — E1122 Type 2 diabetes mellitus with diabetic chronic kidney disease: Secondary | ICD-10-CM

## 2023-05-02 DIAGNOSIS — E1169 Type 2 diabetes mellitus with other specified complication: Secondary | ICD-10-CM | POA: Diagnosis not present

## 2023-05-02 DIAGNOSIS — E785 Hyperlipidemia, unspecified: Secondary | ICD-10-CM | POA: Diagnosis not present

## 2023-05-02 DIAGNOSIS — I10 Essential (primary) hypertension: Secondary | ICD-10-CM

## 2023-05-02 DIAGNOSIS — E039 Hypothyroidism, unspecified: Secondary | ICD-10-CM

## 2023-05-02 DIAGNOSIS — N184 Chronic kidney disease, stage 4 (severe): Secondary | ICD-10-CM

## 2023-05-02 MED ORDER — LEVOTHYROXINE SODIUM 100 MCG PO TABS
100.0000 ug | ORAL_TABLET | Freq: Every day | ORAL | 3 refills | Status: DC
Start: 2023-05-02 — End: 2023-05-14

## 2023-05-02 MED ORDER — OLMESARTAN MEDOXOMIL-HCTZ 40-12.5 MG PO TABS: 1.0000 | ORAL_TABLET | Freq: Every day | ORAL | 3 refills | Status: AC

## 2023-05-02 MED ORDER — ONETOUCH DELICA LANCETS 33G MISC: 12 refills | Status: AC

## 2023-05-02 NOTE — Patient Instructions (Signed)

## 2023-05-02 NOTE — Progress Notes (Signed)
 Established Patient Office Visit  Subjective   Patient ID: Cheryl Cisneros, female    DOB: Jan 15, 1946  Age: 78 y.o. MRN: [id number]  Chief Complaint  Patient presents with   Diabetes   Hyperlipidemia   Hypertension   Hypothyroidism   Follow-up    HPI Discussed the use of AI scribe software for clinical note transcription with the patient, who gave verbal consent to proceed.  History of Present Illness   The patient, with a history of hypertension and diabetes, presents for a routine follow-up. She reports no new health issues since the last visit. She has been adhering to her prescribed medication regimen, which includes Synthroid , Olmesartan , and Levothyroxine , and has been monitoring her blood glucose levels daily. She notes an improvement in her blood sugar levels since the beginning of the year, with a recent reading of 145.  The patient has been experiencing increased blood pressure over the past month, which she attributes to stress following the recent passing of her sister. She has not reported any new symptoms or changes in her health status.  The patient has a significant family history of various health conditions, including diabetes, Alzheimer's, heart conditions, suicide, aneurysms, and blood disorders. Both parents are deceased, with the mother having had diabetes and the father having had cancer.         Review of Systems  Constitutional:  Negative for fever and malaise/fatigue.  HENT:  Negative for congestion.   Eyes:  Negative for blurred vision.  Respiratory:  Negative for shortness of breath.   Cardiovascular:  Negative for chest pain, palpitations and leg swelling.  Gastrointestinal:  Negative for abdominal pain, blood in stool and nausea.  Genitourinary:  Negative for dysuria and frequency.  Musculoskeletal:  Negative for falls.  Skin:  Negative for rash.  Neurological:  Negative for dizziness, loss of consciousness and headaches.  Endo/Heme/Allergies:   Negative for environmental allergies.  Psychiatric/Behavioral:  Negative for depression. The patient is not nervous/anxious.       Objective:     BP (!) 150/70 (BP Location: Left Arm, Patient Position: Sitting, Cuff Size: Normal)   Pulse (!) 59   Temp 97.7 F (36.5 C) (Oral)   Resp 18   Ht 5' 1 (1.549 m)   Wt 120 lb 12.8 oz (54.8 kg)   SpO2 99%   BMI 22.82 kg/m     Physical Exam Vitals and nursing note reviewed.  Constitutional:      General: She is not in acute distress.    Appearance: Normal appearance. She is well-developed.  HENT:     Head: Normocephalic and atraumatic.  Eyes:     General: No scleral icterus.       Right eye: No discharge.        Left eye: No discharge.  Cardiovascular:     Rate and Rhythm: Normal rate and regular rhythm.     Heart sounds: No murmur heard. Pulmonary:     Effort: Pulmonary effort is normal. No respiratory distress.     Breath sounds: Normal breath sounds.  Musculoskeletal:        General: Normal range of motion.     Cervical back: Normal range of motion and neck supple.     Right lower leg: No edema.     Left lower leg: No edema.  Skin:    General: Skin is warm and dry.  Neurological:     Mental Status: She is alert and oriented to person, place, and time.  Psychiatric:        Mood and Affect: Mood normal.        Behavior: Behavior normal.        Thought Content: Thought content normal.        Judgment: Judgment normal.      No results found for any visits on 05/02/23.     The 10-year ASCVD risk score (Arnett DK, et al., 2019) is: 62.5%    Assessment & Plan:   Problem List Items Addressed This Visit       Unprioritized   Diabetes mellitus, type II (HCC)   Relevant Medications   OneTouch Delica Lancets 33G MISC   olmesartan -hydrochlorothiazide (BENICAR  HCT) 40-12.5 MG tablet   Other Relevant Orders   Hemoglobin A1c   Microalbumin / creatinine urine ratio   Hypothyroidism   Relevant Medications    levothyroxine  (SYNTHROID ) 100 MCG tablet   Other Relevant Orders   TSH   Hyperlipidemia associated with type 2 diabetes mellitus (HCC)   Encourage heart healthy diet such as MIND or DASH diet, increase exercise, avoid trans fats, simple carbohydrates and processed foods, consider a krill or fish or flaxseed oil cap daily.        Relevant Medications   olmesartan -hydrochlorothiazide (BENICAR  HCT) 40-12.5 MG tablet   Other Relevant Orders   Lipid panel   Comprehensive metabolic panel   Essential hypertension   Well controlled, no changes to meds. Encouraged heart healthy diet such as the DASH diet and exercise as tolerated.        Relevant Medications   olmesartan -hydrochlorothiazide (BENICAR  HCT) 40-12.5 MG tablet   Other Visit Diagnoses       Primary hypertension    -  Primary   Relevant Medications   olmesartan -hydrochlorothiazide (BENICAR  HCT) 40-12.5 MG tablet   Other Relevant Orders   CBC with Differential/Platelet     Assessment and Plan    Diabetes Mellitus   She reports improved blood sugar readings since the beginning of the year, with a morning blood sugar of 145 mg/dL. She has no issues with feet, sores, or numbness and has an eye doctor appointment scheduled. We will refill lancets, encourage continued daily blood sugar monitoring, and follow up with the eye doctor as scheduled.  Hypertension   Her blood pressure has been elevated today and over the past month, attributed to stress from the recent passing of her sister. We discussed stress management for blood pressure control. We will refill olmesartan , monitor blood pressure regularly, and discuss stress management techniques.  Hypothyroidism   She requires ongoing management with levothyroxine  and emphasized adherence to medication for thyroid  function control. We will refill levothyroxine .  General Health Maintenance   She received a flu shot and no other immediate health maintenance needs were identified. We  will continue routine health maintenance and vaccinations as needed.        No follow-ups on file.    Mana Morison R Lowne Chase, DO

## 2023-05-02 NOTE — Assessment & Plan Note (Signed)
 Well controlled, no changes to meds. Encouraged heart healthy diet such as the DASH diet and exercise as tolerated.

## 2023-05-02 NOTE — Addendum Note (Signed)
 Addended by: Mervin Kung A on: 05/02/2023 11:50 AM   Modules accepted: Orders

## 2023-05-02 NOTE — Assessment & Plan Note (Signed)
 Encourage heart healthy diet such as MIND or DASH diet, increase exercise, avoid trans fats, simple carbohydrates and processed foods, consider a krill or fish or flaxseed oil cap daily.

## 2023-05-03 LAB — CBC WITH DIFFERENTIAL/PLATELET
Absolute Lymphocytes: 2088 {cells}/uL (ref 850–3900)
Absolute Monocytes: 360 {cells}/uL (ref 200–950)
Basophils Absolute: 29 {cells}/uL (ref 0–200)
Basophils Relative: 0.4 %
Eosinophils Absolute: 158 {cells}/uL (ref 15–500)
Eosinophils Relative: 2.2 %
HCT: 36.3 % (ref 35.0–45.0)
Hemoglobin: 12.3 g/dL (ref 11.7–15.5)
MCH: 33 pg (ref 27.0–33.0)
MCHC: 33.9 g/dL (ref 32.0–36.0)
MCV: 97.3 fL (ref 80.0–100.0)
MPV: 12 fL (ref 7.5–12.5)
Monocytes Relative: 5 %
Neutro Abs: 4565 {cells}/uL (ref 1500–7800)
Neutrophils Relative %: 63.4 %
Platelets: 201 10*3/uL (ref 140–400)
RBC: 3.73 10*6/uL — ABNORMAL LOW (ref 3.80–5.10)
RDW: 12.4 % (ref 11.0–15.0)
Total Lymphocyte: 29 %
WBC: 7.2 10*3/uL (ref 3.8–10.8)

## 2023-05-03 LAB — COMPREHENSIVE METABOLIC PANEL
AG Ratio: 2 (calc) (ref 1.0–2.5)
ALT: 15 U/L (ref 6–29)
AST: 15 U/L (ref 10–35)
Albumin: 5.1 g/dL (ref 3.6–5.1)
Alkaline phosphatase (APISO): 34 U/L — ABNORMAL LOW (ref 37–153)
BUN/Creatinine Ratio: 23 (calc) — ABNORMAL HIGH (ref 6–22)
BUN: 37 mg/dL — ABNORMAL HIGH (ref 7–25)
CO2: 25 mmol/L (ref 20–32)
Calcium: 10.2 mg/dL (ref 8.6–10.4)
Chloride: 100 mmol/L (ref 98–110)
Creat: 1.59 mg/dL — ABNORMAL HIGH (ref 0.60–1.00)
Globulin: 2.6 g/dL (ref 1.9–3.7)
Glucose, Bld: 118 mg/dL — ABNORMAL HIGH (ref 65–99)
Potassium: 5.3 mmol/L (ref 3.5–5.3)
Sodium: 139 mmol/L (ref 135–146)
Total Bilirubin: 0.5 mg/dL (ref 0.2–1.2)
Total Protein: 7.7 g/dL (ref 6.1–8.1)

## 2023-05-03 LAB — LIPID PANEL
Cholesterol: 140 mg/dL (ref <200)
HDL: 49 mg/dL — ABNORMAL LOW (ref >=50)
LDL Cholesterol (Calc): 67 mg/dL
Non-HDL Cholesterol (Calc): 91 mg/dL (ref <130)
Total CHOL/HDL Ratio: 2.9 (calc) (ref <5.0)
Triglycerides: 163 mg/dL — ABNORMAL HIGH (ref <150)

## 2023-05-03 LAB — HEMOGLOBIN A1C
Hgb A1c MFr Bld: 7 %{Hb} — ABNORMAL HIGH (ref <5.7)
Mean Plasma Glucose: 154 mg/dL
eAG (mmol/L): 8.5 mmol/L

## 2023-05-03 LAB — TSH: TSH: 0.32 m[IU]/L — ABNORMAL LOW (ref 0.40–4.50)

## 2023-05-03 LAB — MICROALBUMIN / CREATININE URINE RATIO
Creatinine, Urine: 83 mg/dL (ref 20–275)
Microalb Creat Ratio: 29 mg/g{creat} (ref <30)
Microalb, Ur: 2.4 mg/dL

## 2023-05-11 ENCOUNTER — Encounter: Payer: Self-pay | Admitting: Family Medicine

## 2023-05-14 ENCOUNTER — Other Ambulatory Visit: Payer: Self-pay

## 2023-05-14 MED ORDER — LEVOTHYROXINE SODIUM 88 MCG PO TABS: 88.0000 ug | ORAL_TABLET | Freq: Every day | ORAL | 2 refills | Status: DC

## 2023-05-14 NOTE — Addendum Note (Signed)
Addended byConrad Puebl[place] del [place] D on: 05/14/2023 09:45 AM   Modules accepted: Orders

## 2023-05-24 ENCOUNTER — Other Ambulatory Visit: Payer: Self-pay | Admitting: Family Medicine

## 2023-05-24 ENCOUNTER — Other Ambulatory Visit: Payer: Self-pay | Admitting: Medical

## 2023-05-24 DIAGNOSIS — K219 Gastro-esophageal reflux disease without esophagitis: Secondary | ICD-10-CM

## 2023-05-24 DIAGNOSIS — E1165 Type 2 diabetes mellitus with hyperglycemia: Secondary | ICD-10-CM

## 2023-05-24 DIAGNOSIS — E785 Hyperlipidemia, unspecified: Secondary | ICD-10-CM

## 2023-05-26 DIAGNOSIS — J3081 Allergic rhinitis due to animal (cat) (dog) hair and dander: Secondary | ICD-10-CM | POA: Diagnosis not present

## 2023-05-26 DIAGNOSIS — J301 Allergic rhinitis due to pollen: Secondary | ICD-10-CM | POA: Diagnosis not present

## 2023-05-26 DIAGNOSIS — J3089 Other allergic rhinitis: Secondary | ICD-10-CM | POA: Diagnosis not present

## 2023-05-29 DIAGNOSIS — Z1231 Encounter for screening mammogram for malignant neoplasm of breast: Secondary | ICD-10-CM | POA: Diagnosis not present

## 2023-05-29 LAB — HM MAMMOGRAPHY

## 2023-06-24 DIAGNOSIS — R051 Acute cough: Secondary | ICD-10-CM | POA: Diagnosis not present

## 2023-06-24 DIAGNOSIS — A491 Streptococcal infection, unspecified site: Secondary | ICD-10-CM | POA: Diagnosis not present

## 2023-07-07 DIAGNOSIS — J301 Allergic rhinitis due to pollen: Secondary | ICD-10-CM | POA: Diagnosis not present

## 2023-07-07 DIAGNOSIS — J3081 Allergic rhinitis due to animal (cat) (dog) hair and dander: Secondary | ICD-10-CM | POA: Diagnosis not present

## 2023-07-07 DIAGNOSIS — J3089 Other allergic rhinitis: Secondary | ICD-10-CM | POA: Diagnosis not present

## 2023-07-14 DIAGNOSIS — L814 Other melanin hyperpigmentation: Secondary | ICD-10-CM | POA: Diagnosis not present

## 2023-07-14 DIAGNOSIS — J3081 Allergic rhinitis due to animal (cat) (dog) hair and dander: Secondary | ICD-10-CM | POA: Diagnosis not present

## 2023-07-14 DIAGNOSIS — L821 Other seborrheic keratosis: Secondary | ICD-10-CM | POA: Diagnosis not present

## 2023-07-14 DIAGNOSIS — L570 Actinic keratosis: Secondary | ICD-10-CM | POA: Diagnosis not present

## 2023-07-14 DIAGNOSIS — D692 Other nonthrombocytopenic purpura: Secondary | ICD-10-CM | POA: Diagnosis not present

## 2023-07-14 DIAGNOSIS — J301 Allergic rhinitis due to pollen: Secondary | ICD-10-CM | POA: Diagnosis not present

## 2023-07-14 DIAGNOSIS — J3089 Other allergic rhinitis: Secondary | ICD-10-CM | POA: Diagnosis not present

## 2023-07-14 DIAGNOSIS — D225 Melanocytic nevi of trunk: Secondary | ICD-10-CM | POA: Diagnosis not present

## 2023-07-15 ENCOUNTER — Other Ambulatory Visit (INDEPENDENT_AMBULATORY_CARE_PROVIDER_SITE_OTHER): Payer: Medicare Other

## 2023-07-15 ENCOUNTER — Telehealth: Payer: Self-pay | Admitting: Family Medicine

## 2023-07-15 DIAGNOSIS — E039 Hypothyroidism, unspecified: Secondary | ICD-10-CM | POA: Diagnosis not present

## 2023-07-15 LAB — TSH: TSH: 0.75 u[IU]/mL (ref 0.35–5.50)

## 2023-07-15 NOTE — Telephone Encounter (Signed)
 Pt wants to know if she should be taking alendronate 70 mg. Pt said it has not been called in since last year. Please advise, pt would like a call regarding rx.

## 2023-07-16 NOTE — Telephone Encounter (Signed)
 Pt called. LVM to return call

## 2023-07-17 NOTE — Telephone Encounter (Signed)
 Copied from CRM [id number]. Topic: General - Other >> Jul 17, 2023 10:11 AM Almira Coaster wrote: Reason for CRM: Patient is returning a call she received from Luster Landsberg, no information left to disclose to patient. Called [facility] and was informed Herbert Seta was not in today.

## 2023-07-18 NOTE — Telephone Encounter (Signed)
 Pt called. LVM to return call

## 2023-07-21 DIAGNOSIS — J3089 Other allergic rhinitis: Secondary | ICD-10-CM | POA: Diagnosis not present

## 2023-07-21 DIAGNOSIS — J301 Allergic rhinitis due to pollen: Secondary | ICD-10-CM | POA: Diagnosis not present

## 2023-07-21 DIAGNOSIS — J3081 Allergic rhinitis due to animal (cat) (dog) hair and dander: Secondary | ICD-10-CM | POA: Diagnosis not present

## 2023-07-26 ENCOUNTER — Other Ambulatory Visit: Payer: Self-pay | Admitting: Family Medicine

## 2023-07-26 DIAGNOSIS — E1151 Type 2 diabetes mellitus with diabetic peripheral angiopathy without gangrene: Secondary | ICD-10-CM

## 2023-07-28 DIAGNOSIS — M810 Age-related osteoporosis without current pathological fracture: Secondary | ICD-10-CM | POA: Diagnosis not present

## 2023-07-28 LAB — HM DEXA SCAN

## 2023-07-29 ENCOUNTER — Encounter: Payer: Self-pay | Admitting: Family Medicine

## 2023-08-03 ENCOUNTER — Encounter: Payer: Self-pay | Admitting: Family Medicine

## 2023-08-18 DIAGNOSIS — J3081 Allergic rhinitis due to animal (cat) (dog) hair and dander: Secondary | ICD-10-CM | POA: Diagnosis not present

## 2023-08-18 DIAGNOSIS — J3089 Other allergic rhinitis: Secondary | ICD-10-CM | POA: Diagnosis not present

## 2023-08-20 DIAGNOSIS — J3089 Other allergic rhinitis: Secondary | ICD-10-CM | POA: Diagnosis not present

## 2023-08-20 DIAGNOSIS — J3081 Allergic rhinitis due to animal (cat) (dog) hair and dander: Secondary | ICD-10-CM | POA: Diagnosis not present

## 2023-08-20 DIAGNOSIS — J301 Allergic rhinitis due to pollen: Secondary | ICD-10-CM | POA: Diagnosis not present

## 2023-08-21 DIAGNOSIS — K080 Exfoliation of teeth due to systemic causes: Secondary | ICD-10-CM | POA: Diagnosis not present

## 2023-08-22 ENCOUNTER — Other Ambulatory Visit: Payer: Self-pay | Admitting: Family Medicine

## 2023-08-22 DIAGNOSIS — E1165 Type 2 diabetes mellitus with hyperglycemia: Secondary | ICD-10-CM

## 2023-08-27 DIAGNOSIS — I129 Hypertensive chronic kidney disease with stage 1 through stage 4 chronic kidney disease, or unspecified chronic kidney disease: Secondary | ICD-10-CM | POA: Diagnosis not present

## 2023-08-27 DIAGNOSIS — D631 Anemia in chronic kidney disease: Secondary | ICD-10-CM | POA: Diagnosis not present

## 2023-08-27 DIAGNOSIS — N1832 Chronic kidney disease, stage 3b: Secondary | ICD-10-CM | POA: Diagnosis not present

## 2023-08-27 DIAGNOSIS — E1122 Type 2 diabetes mellitus with diabetic chronic kidney disease: Secondary | ICD-10-CM | POA: Diagnosis not present

## 2023-08-29 ENCOUNTER — Other Ambulatory Visit: Payer: Self-pay | Admitting: Medical

## 2023-09-18 DIAGNOSIS — J3081 Allergic rhinitis due to animal (cat) (dog) hair and dander: Secondary | ICD-10-CM | POA: Diagnosis not present

## 2023-09-18 DIAGNOSIS — J301 Allergic rhinitis due to pollen: Secondary | ICD-10-CM | POA: Diagnosis not present

## 2023-09-18 DIAGNOSIS — J3089 Other allergic rhinitis: Secondary | ICD-10-CM | POA: Diagnosis not present

## 2023-09-23 DIAGNOSIS — J301 Allergic rhinitis due to pollen: Secondary | ICD-10-CM | POA: Diagnosis not present

## 2023-09-23 DIAGNOSIS — J3081 Allergic rhinitis due to animal (cat) (dog) hair and dander: Secondary | ICD-10-CM | POA: Diagnosis not present

## 2023-09-23 DIAGNOSIS — J3089 Other allergic rhinitis: Secondary | ICD-10-CM | POA: Diagnosis not present

## 2023-09-30 DIAGNOSIS — J3089 Other allergic rhinitis: Secondary | ICD-10-CM | POA: Diagnosis not present

## 2023-09-30 DIAGNOSIS — J301 Allergic rhinitis due to pollen: Secondary | ICD-10-CM | POA: Diagnosis not present

## 2023-09-30 DIAGNOSIS — J3081 Allergic rhinitis due to animal (cat) (dog) hair and dander: Secondary | ICD-10-CM | POA: Diagnosis not present

## 2023-10-06 DIAGNOSIS — J3081 Allergic rhinitis due to animal (cat) (dog) hair and dander: Secondary | ICD-10-CM | POA: Diagnosis not present

## 2023-10-06 DIAGNOSIS — J3089 Other allergic rhinitis: Secondary | ICD-10-CM | POA: Diagnosis not present

## 2023-10-06 DIAGNOSIS — J301 Allergic rhinitis due to pollen: Secondary | ICD-10-CM | POA: Diagnosis not present

## 2023-10-13 DIAGNOSIS — J3089 Other allergic rhinitis: Secondary | ICD-10-CM | POA: Diagnosis not present

## 2023-10-13 DIAGNOSIS — J301 Allergic rhinitis due to pollen: Secondary | ICD-10-CM | POA: Diagnosis not present

## 2023-10-13 DIAGNOSIS — J3081 Allergic rhinitis due to animal (cat) (dog) hair and dander: Secondary | ICD-10-CM | POA: Diagnosis not present

## 2023-11-10 ENCOUNTER — Ambulatory Visit: Payer: Medicare Other | Admitting: Family Medicine

## 2023-11-24 ENCOUNTER — Encounter: Payer: Self-pay | Admitting: Family Medicine

## 2023-11-24 ENCOUNTER — Ambulatory Visit (INDEPENDENT_AMBULATORY_CARE_PROVIDER_SITE_OTHER): Admitting: Family Medicine

## 2023-11-24 VITALS — BP 120/74 | HR 71 | Temp 98.2°F | Resp 18 | Ht 61.0 in | Wt 118.2 lb

## 2023-11-24 DIAGNOSIS — N184 Chronic kidney disease, stage 4 (severe): Secondary | ICD-10-CM | POA: Diagnosis not present

## 2023-11-24 DIAGNOSIS — E1169 Type 2 diabetes mellitus with other specified complication: Secondary | ICD-10-CM

## 2023-11-24 DIAGNOSIS — J3081 Allergic rhinitis due to animal (cat) (dog) hair and dander: Secondary | ICD-10-CM | POA: Diagnosis not present

## 2023-11-24 DIAGNOSIS — I10 Essential (primary) hypertension: Secondary | ICD-10-CM | POA: Diagnosis not present

## 2023-11-24 DIAGNOSIS — J3089 Other allergic rhinitis: Secondary | ICD-10-CM | POA: Diagnosis not present

## 2023-11-24 DIAGNOSIS — Z7984 Long term (current) use of oral hypoglycemic drugs: Secondary | ICD-10-CM

## 2023-11-24 DIAGNOSIS — M8589 Other specified disorders of bone density and structure, multiple sites: Secondary | ICD-10-CM

## 2023-11-24 DIAGNOSIS — E785 Hyperlipidemia, unspecified: Secondary | ICD-10-CM

## 2023-11-24 DIAGNOSIS — E039 Hypothyroidism, unspecified: Secondary | ICD-10-CM

## 2023-11-24 DIAGNOSIS — E1122 Type 2 diabetes mellitus with diabetic chronic kidney disease: Secondary | ICD-10-CM

## 2023-11-24 DIAGNOSIS — J301 Allergic rhinitis due to pollen: Secondary | ICD-10-CM | POA: Diagnosis not present

## 2023-11-24 LAB — COMPREHENSIVE METABOLIC PANEL WITH GFR
ALT: 13 U/L (ref 0–35)
AST: 13 U/L (ref 0–37)
Albumin: 4.8 g/dL (ref 3.5–5.2)
Alkaline Phosphatase: 27 U/L — ABNORMAL LOW (ref 39–117)
BUN: 37 mg/dL — ABNORMAL HIGH (ref 6–23)
CO2: 27 meq/L (ref 19–32)
Calcium: 9.7 mg/dL (ref 8.4–10.5)
Chloride: 104 meq/L (ref 96–112)
Creatinine, Ser: 1.62 mg/dL — ABNORMAL HIGH (ref 0.40–1.20)
GFR: 29.9 mL/min — ABNORMAL LOW (ref >60.00)
Glucose, Bld: 166 mg/dL — ABNORMAL HIGH (ref 70–99)
Potassium: 4.6 meq/L (ref 3.5–5.1)
Sodium: 142 meq/L (ref 135–145)
Total Bilirubin: 0.5 mg/dL (ref 0.2–1.2)
Total Protein: 7.5 g/dL (ref 6.0–8.3)

## 2023-11-24 LAB — LIPID PANEL
Cholesterol: 187 mg/dL (ref 0–200)
HDL: 52.3 mg/dL (ref >39.00)
LDL Cholesterol: 100 mg/dL — ABNORMAL HIGH (ref 0–99)
NonHDL: 134.41
Total CHOL/HDL Ratio: 4
Triglycerides: 171 mg/dL — ABNORMAL HIGH (ref 0.0–149.0)
VLDL: 34.2 mg/dL (ref 0.0–40.0)

## 2023-11-24 LAB — CBC WITH DIFFERENTIAL/PLATELET
Basophils Absolute: 0.1 K/uL (ref 0.0–0.1)
Basophils Relative: 0.9 % (ref 0.0–3.0)
Eosinophils Absolute: 0.1 K/uL (ref 0.0–0.7)
Eosinophils Relative: 2.1 % (ref 0.0–5.0)
HCT: 37.3 % (ref 36.0–46.0)
Hemoglobin: 12.4 g/dL (ref 12.0–15.0)
Lymphocytes Relative: 35.6 % (ref 12.0–46.0)
Lymphs Abs: 2.1 K/uL (ref 0.7–4.0)
MCHC: 33.3 g/dL (ref 30.0–36.0)
MCV: 97.7 fl (ref 78.0–100.0)
Monocytes Absolute: 0.3 K/uL (ref 0.1–1.0)
Monocytes Relative: 5.3 % (ref 3.0–12.0)
Neutro Abs: 3.3 K/uL (ref 1.4–7.7)
Neutrophils Relative %: 56.1 % (ref 43.0–77.0)
Platelets: 224 K/uL (ref 150.0–400.0)
RBC: 3.82 Mil/uL — ABNORMAL LOW (ref 3.87–5.11)
RDW: 12.7 % (ref 11.5–15.5)
WBC: 5.8 K/uL (ref 4.0–10.5)

## 2023-11-24 LAB — MICROALBUMIN / CREATININE URINE RATIO
Creatinine,U: 114.5 mg/dL
Microalb Creat Ratio: 65.9 mg/g — ABNORMAL HIGH (ref 0.0–30.0)
Microalb, Ur: 7.5 mg/dL — ABNORMAL HIGH (ref 0.0–1.9)

## 2023-11-24 LAB — HEMOGLOBIN A1C: Hgb A1c MFr Bld: 7.5 % — ABNORMAL HIGH (ref 4.6–6.5)

## 2023-11-24 LAB — TSH: TSH: 1.66 u[IU]/mL (ref 0.35–5.50)

## 2023-11-24 MED ORDER — ALENDRONATE SODIUM 70 MG PO TABS: ORAL_TABLET | ORAL | 3 refills | Status: AC

## 2023-11-24 MED ORDER — ONETOUCH DELICA PLUS LANCET33G MISC: 3 refills | Status: AC

## 2023-11-24 NOTE — Assessment & Plan Note (Signed)
 Well controlled, no changes to meds. Encouraged heart healthy diet such as the DASH diet and exercise as tolerated.

## 2023-11-24 NOTE — Assessment & Plan Note (Signed)
 Encourage heart healthy diet such as MIND or DASH diet, increase exercise, avoid trans fats, simple carbohydrates and processed foods, consider a krill or fish or flaxseed oil cap daily.

## 2023-11-24 NOTE — Assessment & Plan Note (Signed)
 Check labs

## 2023-11-24 NOTE — Progress Notes (Signed)
 Subjective:    Patient ID: Cheryl Cisneros, female    DOB: 01/24/1946, 78 y.o.   MRN: [id number]  Chief Complaint  Patient presents with   Diabetes   Hypertension   Hypothyroidism   Follow-up    HPI Patient is in today for f/u.  Discussed the use of AI scribe software for clinical note transcription with the patient, who gave verbal consent to proceed.  History of Present Illness Cheryl Cisneros is an 78 year old female with diabetes who presents for a routine follow-up visit.  Her blood sugar levels have been stable, with fluctuations described as 'good days and bad days,' but nothing 'astronomical.'  She has not been taking her osteoporosis medication, Fosamax , for about a year despite attempts to refill it. She is unsure why it has not been refilled. She is not taking calcium or vitamin D  supplements but engages in regular exercise, including attending boot camp.  She recently traveled to [place]  to visit her daughter, driving two days each way. She usually visits once a year but has been twice this year due to her daughter's wedding and to visit her husband, who is experiencing memory issues. Her husband has been showing signs of dementia, and she has been trying to get an evaluation through the [facility], which has been delayed. During her recent trip, a brain scan at the [place] confirmed signs of dementia. She is concerned about the progression of his condition.  She turned 78 this summer and recently celebrated her aunt's 80th birthday in [place] . She does not use MyChart due to difficulties accessing it.  The patient stated she has not experienced any falls or noticed significant changes in her memory. No recent eye doctor visit, but an appointment is scheduled for this month.    Past Medical History:  Diagnosis Date   Allergy    Arthritis    Asthma    Borderline glaucoma    Cataract    Chronic kidney disease    kidney stones   Diabetes (HCC)    GERD (gastroesophageal reflux  disease)    Hypertension    Hypothyroidism 10/27/2006   Qualifier: Diagnosis of  By: Antonio ROSALEA Rockers     Macular degeneration 12/25/2011   Osteopenia    Thyroid  disease    Hypothyroidism    Past Surgical History:  Procedure Laterality Date   ABDOMINAL HYSTERECTOMY  1986   TAH BSO   BACK SURGERY  2016   x3 total- 1999, 2016, 2017   bladder surgery     repair   CATARACT EXTRACTION Bilateral    COLONOSCOPY     EYE SURGERY Right 07/28/2017   Dr.Beavis. Cataract removal   KNEE ARTHROSCOPY Right 2002   LITHOTRIPSY     2004   LUMBAR LAMINECTOMY  1999   ROTATOR CUFF REPAIR Right 02/27/2005   TONSILLECTOMY  1963    Family History  Problem Relation Age of Onset   Diabetes Mother    Hypertension Mother    Kidney disease Mother        dialysis   Diabetes Father    Hypertension Father    Cancer Father        lung   Dementia Sister    Diabetes Sister    Hyperlipidemia Sister    Hypertension Sister    Heart disease Sister    Other Sister        blood disorder   Diabetes Sister    Hyperlipidemia Sister    Hypertension  Sister    Dementia Sister    Chronic infections Sister    Aneurysm Sister    Diabetes Brother    Hyperlipidemia Brother    Hypertension Brother    Heart disease Brother    Suicidality Brother    Heart disease Brother    Cervical cancer Daughter    Cancer Daughter 45       cervical   Stomach cancer Other    Rectal cancer Other    Coronary artery disease Other    Lung cancer Other    Colon cancer Neg Hx    Esophageal cancer Neg Hx     Social History   Socioeconomic History   Marital status: Married    Spouse name: Not on file   Number of children: Not on file   Years of education: Not on file   Highest education level: Not on file  Occupational History   Occupation: housewife    Employer: UNEMPLOYED  Tobacco Use   Smoking status: Never   Smokeless tobacco: Never  Vaping Use   Vaping status: Never Used  Substance and Sexual Activity    Alcohol use: No    Alcohol/week: 0.0 standard drinks of alcohol   Drug use: No   Sexual activity: Not Currently    Partners: Male  Other Topics Concern   Not on file  Social History Narrative   Exercise--  bootcamp 3 days a week and walking   Social Drivers of Health   Financial Resource Strain: Low Risk  (10/25/2022)   Overall Financial Resource Strain (CARDIA)    Difficulty of Paying Living Expenses: Not hard at all  Food Insecurity: No Food Insecurity (10/25/2022)   Hunger Vital Sign    Worried About Running Out of Food in the Last Year: Never true    Ran Out of Food in the Last Year: Never true  Transportation Needs: No Transportation Needs (10/25/2022)   PRAPARE - Administrator, Civil Service (Medical): No    Lack of Transportation (Non-Medical): No  Physical Activity: Insufficiently Active (10/25/2022)   Exercise Vital Sign    Days of Exercise per Week: 2 days    Minutes of Exercise per Session: 60 min  Stress: No Stress Concern Present (10/25/2022)   [employer] of Occupational Health - Occupational Stress Questionnaire    Feeling of Stress : Not at all  Social Connections: Socially Integrated (10/25/2022)   Social Connection and Isolation Panel    Frequency of Communication with Friends and Family: More than three times a week    Frequency of Social Gatherings with Friends and Family: Twice a week    Attends Religious Services: More than 4 times per year    Active Member of [employer] or Organizations: Yes    Attends Engineer, structural: More than 4 times per year    Marital Status: Married  Catering manager Violence: Not At Risk (10/25/2022)   Humiliation, Afraid, Rape, and Kick questionnaire    Fear of Current or Ex-Partner: No    Emotionally Abused: No    Physically Abused: No    Sexually Abused: No    Outpatient Medications Prior to Visit  Medication Sig Dispense Refill   aspirin  EC 81 MG tablet Take 1 tablet (81 mg total) by mouth daily.      Biotin 1000 MCG tablet Take 5,000 mcg by mouth 2 (two) times daily.      Calcium-Vitamin D  600-200 MG-UNIT per tablet Take 1 tablet by mouth 3 (  three) times daily with meals.     cetirizine  (ZYRTEC ) 10 MG tablet Take 1 tablet by mouth once daily 90 tablet 0   Cholecalciferol (VITAMIN D3) 1000 UNITS CAPS Take 1 capsule by mouth daily.     CINNAMON PO Take by mouth. 500 mg- 3 per day with meals     CRANBERRY PO Take by mouth. 500 mg daily     EPIPEN 2-PAK 0.3 MG/0.3ML DEVI Reported on 07/26/2015     Ferrous Gluconate (IRON 27 PO) Take by mouth daily.     Garlic Oil 1000 MG CAPS Take 1 capsule by mouth daily.     glucose blood (ONETOUCH ULTRA) test strip USE  STRIP TO CHECK GLUCOSE TWICE DAILY 200 each 0   JARDIANCE 10 MG TABS tablet Take 10 mg by mouth daily.     L-Lysine 500 MG TABS Take 1 tablet by mouth daily.     levothyroxine  (SYNTHROID ) 88 MCG tablet Take 1 tablet by mouth once daily 30 tablet 2   Magnesium 250 MG TABS Take 1 tablet by mouth daily.     metFORMIN  (GLUCOPHAGE ) 1000 MG tablet TAKE 1 TABLET BY MOUTH TWICE DAILY WITH MEALS 180 tablet 0   nateglinide  (STARLIX ) 120 MG tablet TAKE 1 TABLET BY MOUTH THREE TIMES DAILY WITH MEALS 270 tablet 0   NONFORMULARY OR COMPOUNDED ITEM viviscal advanced hair health  1 po bid 30 each 5   olmesartan -hydrochlorothiazide (BENICAR  HCT) 40-12.5 MG tablet Take 1 tablet by mouth daily. 90 tablet 3   Omega-3 Fatty Acids (OMEGA 3 PO) Take 2 tablets by mouth daily.     omeprazole  (PRILOSEC) 40 MG capsule Take 1 capsule (40 mg total) by mouth daily. 90 capsule 1   OneTouch Delica Lancets 33G MISC USE   TO CHECK GLUCOSE TWICE DAILY 200 each 12   OVER THE COUNTER MEDICATION Maxi-vision 2 capsules per day     pravastatin  (PRAVACHOL ) 10 MG tablet Take 1 tablet (10 mg total) by mouth daily. 90 tablet 1   SELENIUM PO Take by mouth. 200 mcg daily     SIMPLY SALINE NA Place 1 spray into the nose daily.     Specialty Vitamins Products (HAIR NOURISHING SUPPLEMENT  PO) Take by mouth. Per patient twice a day after meals.     Turmeric 500 MG CAPS Take 1 capsule by mouth 3 (three) times daily.      vitamin C (ASCORBIC ACID) 500 MG tablet Take 1,000 mg by mouth daily.     alendronate  (FOSAMAX ) 70 MG tablet TAKE ONE TABLET BY MOUTH ONCE A WEEK IN THE MORNING WITH A FULL GLASS OF WATER, 30 MINUTES BEFORE A MEAL OR BEVERAGE. REMAIN UPRIGHT 12 tablet 3   No facility-administered medications prior to visit.    Allergies  Allergen Reactions   Losartan  Potassium Other (See Comments)    Hair loss   Metoprolol      Hair loss   Norvasc  [Amlodipine  Besylate] Other (See Comments)    HAIR LOSS    Review of Systems  Constitutional:  Negative for fever and malaise/fatigue.  HENT:  Negative for congestion.   Eyes:  Negative for blurred vision.  Respiratory:  Negative for cough and shortness of breath.   Cardiovascular:  Negative for chest pain, palpitations and leg swelling.  Gastrointestinal:  Negative for abdominal pain, blood in stool, nausea and vomiting.  Genitourinary:  Negative for dysuria and frequency.  Musculoskeletal:  Negative for back pain and falls.  Skin:  Negative for rash.  Neurological:  Negative for dizziness, loss of consciousness and headaches.  Endo/Heme/Allergies:  Negative for environmental allergies.  Psychiatric/Behavioral:  Negative for depression. The patient is not nervous/anxious.        Objective:    Physical Exam Vitals and nursing note reviewed.  Constitutional:      General: She is not in acute distress.    Appearance: Normal appearance. She is well-developed.  HENT:     Head: Normocephalic and atraumatic.  Eyes:     General: No scleral icterus.       Right eye: No discharge.        Left eye: No discharge.  Cardiovascular:     Rate and Rhythm: Normal rate and regular rhythm.     Heart sounds: No murmur heard. Pulmonary:     Effort: Pulmonary effort is normal. No respiratory distress.     Breath sounds: Normal  breath sounds.  Musculoskeletal:        General: Normal range of motion.     Cervical back: Normal range of motion and neck supple.     Right lower leg: No edema.     Left lower leg: No edema.  Skin:    General: Skin is warm and dry.  Neurological:     Mental Status: She is alert and oriented to person, place, and time.  Psychiatric:        Mood and Affect: Mood normal.        Behavior: Behavior normal.        Thought Content: Thought content normal.        Judgment: Judgment normal.     BP 120/74 (BP Location: Left Arm, Patient Position: Sitting, Cuff Size: Normal)   Pulse 71   Temp 98.2 F (36.8 C) (Oral)   Resp 18   Ht 5' 1 (1.549 m)   Wt 118 lb 3.2 oz (53.6 kg)   SpO2 100%   BMI 22.33 kg/m  Wt Readings from Last 3 Encounters:  11/24/23 118 lb 3.2 oz (53.6 kg)  05/02/23 120 lb 12.8 oz (54.8 kg)  10/25/22 122 lb 12.8 oz (55.7 kg)    Diabetic Foot Exam - Simple   Simple Foot Form Diabetic Foot exam was performed with the following findings: Yes 11/24/2023 10:25 AM  Visual Inspection No deformities, no ulcerations, no other skin breakdown bilaterally: Yes Sensation Testing Intact to touch and monofilament testing bilaterally: Yes Pulse Check Posterior Tibialis and Dorsalis pulse intact bilaterally: Yes Comments    Lab Results  Component Value Date   WBC 7.2 05/02/2023   HGB 12.3 05/02/2023   HCT 36.3 05/02/2023   PLT 201 05/02/2023   GLUCOSE 118 (H) 05/02/2023   CHOL 140 05/02/2023   TRIG 163 (H) 05/02/2023   HDL 49 (L) 05/02/2023   LDLDIRECT 71.0 03/26/2021   LDLCALC 67 05/02/2023   ALT 15 05/02/2023   AST 15 05/02/2023   NA 139 05/02/2023   K 5.3 05/02/2023   CL 100 05/02/2023   CREATININE 1.59 (H) 05/02/2023   BUN 37 (H) 05/02/2023   CO2 25 05/02/2023   TSH 0.75 07/15/2023   HGBA1C 7.0 (H) 05/02/2023   MICROALBUR 2.4 05/02/2023    Lab Results  Component Value Date   TSH 0.75 07/15/2023   Lab Results  Component Value Date   WBC 7.2  05/02/2023   HGB 12.3 05/02/2023   HCT 36.3 05/02/2023   MCV 97.3 05/02/2023   PLT 201 05/02/2023   Lab Results  Component Value Date  NA 139 05/02/2023   K 5.3 05/02/2023   CO2 25 05/02/2023   GLUCOSE 118 (H) 05/02/2023   BUN 37 (H) 05/02/2023   CREATININE 1.59 (H) 05/02/2023   BILITOT 0.5 05/02/2023   ALKPHOS 26 (L) 12/04/2021   AST 15 05/02/2023   ALT 15 05/02/2023   PROT 7.7 05/02/2023   ALBUMIN 4.8 12/04/2021   CALCIUM 10.2 05/02/2023   GFR 30.32 (L) 12/04/2021   Lab Results  Component Value Date   CHOL 140 05/02/2023   Lab Results  Component Value Date   HDL 49 (L) 05/02/2023   Lab Results  Component Value Date   LDLCALC 67 05/02/2023   Lab Results  Component Value Date   TRIG 163 (H) 05/02/2023   Lab Results  Component Value Date   CHOLHDL 2.9 05/02/2023   Lab Results  Component Value Date   HGBA1C 7.0 (H) 05/02/2023       Assessment & Plan:  Hypothyroidism, unspecified type Assessment & Plan: Check labs   Orders: -     TSH  Primary hypertension -     CBC with Differential/Platelet -     Microalbumin / creatinine urine ratio  Hyperlipidemia associated with type 2 diabetes mellitus (HCC) Assessment & Plan: Encourage heart healthy diet such as MIND or DASH diet, increase exercise, avoid trans fats, simple carbohydrates and processed foods, consider a krill or fish or flaxseed oil cap daily.    Orders: -     Lipid panel -     Comprehensive metabolic panel with GFR  Type 2 diabetes mellitus with stage 4 chronic kidney disease, without long-term current use of insulin (HCC) -     Hemoglobin A1c -     OneTouch Delica Plus Lancet33G; As directed  Dispense: 100 each; Refill: 3  Osteopenia of multiple sites -     Alendronate  Sodium; TAKE ONE TABLET BY MOUTH ONCE A WEEK IN THE MORNING WITH A FULL GLASS OF WATER, 30 MINUTES BEFORE A MEAL OR BEVERAGE. REMAIN UPRIGHT  Dispense: 12 tablet; Refill: 3  Essential hypertension Assessment &  Plan: Well controlled, no changes to meds. Encouraged heart healthy diet such as the DASH diet and exercise as tolerated.     Assessment and Plan Assessment & Plan Type 2 diabetes mellitus without complications   Type 2 diabetes mellitus is well-managed with consistent blood sugar levels and no complications. Renew lancets for OneTouch Delica for blood sugar monitoring.  Age-related osteoporosis without current pathological fracture   Ongoing management of age-related osteoporosis is necessary. She has not taken Fosamax  for a year and is not using calcium or vitamin D  supplements, though she exercises regularly. Renew Fosamax  prescription and encourage calcium and vitamin D  supplementation.    Novak Stgermaine R Lowne Chase, DO

## 2023-11-24 NOTE — Patient Instructions (Signed)

## 2023-11-25 ENCOUNTER — Other Ambulatory Visit: Payer: Self-pay | Admitting: Family Medicine

## 2023-11-25 ENCOUNTER — Other Ambulatory Visit: Payer: Self-pay | Admitting: Medical

## 2023-11-25 DIAGNOSIS — K219 Gastro-esophageal reflux disease without esophagitis: Secondary | ICD-10-CM

## 2023-11-25 DIAGNOSIS — E785 Hyperlipidemia, unspecified: Secondary | ICD-10-CM

## 2023-11-25 DIAGNOSIS — E1165 Type 2 diabetes mellitus with hyperglycemia: Secondary | ICD-10-CM

## 2023-11-25 DIAGNOSIS — E1151 Type 2 diabetes mellitus with diabetic peripheral angiopathy without gangrene: Secondary | ICD-10-CM

## 2023-11-30 ENCOUNTER — Ambulatory Visit: Payer: Self-pay | Admitting: Family Medicine

## 2023-11-30 DIAGNOSIS — E1122 Type 2 diabetes mellitus with diabetic chronic kidney disease: Secondary | ICD-10-CM

## 2023-11-30 DIAGNOSIS — E1169 Type 2 diabetes mellitus with other specified complication: Secondary | ICD-10-CM

## 2023-12-03 ENCOUNTER — Encounter

## 2023-12-05 ENCOUNTER — Telehealth: Payer: Self-pay | Admitting: *Deleted

## 2023-12-05 ENCOUNTER — Ambulatory Visit (INDEPENDENT_AMBULATORY_CARE_PROVIDER_SITE_OTHER): Admitting: *Deleted

## 2023-12-05 VITALS — BP 155/58 | HR 82 | Temp 97.8°F | Resp 16 | Ht 61.0 in | Wt 118.0 lb

## 2023-12-05 DIAGNOSIS — Z0000 Encounter for general adult medical examination without abnormal findings: Secondary | ICD-10-CM | POA: Diagnosis not present

## 2023-12-05 NOTE — Telephone Encounter (Signed)
 Pt had AWV today.  BP readings: 179/58, 155/58.  She reports spouse has been diagnosed with alzheimer's and she is caregiver.  She thinks readings today are related to her stress. Reports compliance with medications. She is feeling some stress with this. She doesn't feel she needs therapy / medications for her stress at this time and has been encouraged to reach out if situation / needs change.

## 2023-12-05 NOTE — Patient Instructions (Addendum)
 Ms. Mccleave , Thank you for taking time out of your busy schedule to complete your Annual Wellness Visit with me. I enjoyed our conversation and look forward to speaking with you again next year. I, as well as your care team,  appreciate your ongoing commitment to your health goals. Please review the following plan we discussed and let me know if I can assist you in the future. Your Game plan/ To Do List    Referrals: If you haven't heard from the office you've been referred to, please reach out to them at the phone provided.   Follow up Visits: Next Medicare AWV with our clinical staff: 12/07/24  10:20am  Next Office Visit with your provider: 05/27/24 9am  Clinician Recommendations:  Aim for 30 minutes of exercise or brisk walking, 6-8 glasses of water, and 5 servings of fruits and vegetables each day.    You will need to get the following vaccines at your local pharmacy:  Tetanus, flu     This is a list of the screening recommended for you and due dates:  Health Maintenance  Topic Date Due   Eye exam for diabetics  05/23/2022   DTaP/Tdap/Td vaccine (3 - Td or Tdap) 06/29/2023   Medicare Annual Wellness Visit  10/25/2023   Flu Shot  11/21/2023   COVID-19 Vaccine (5 - 2024-25 season) 12/10/2023*   Hemoglobin A1C  05/26/2024   Mammogram  05/28/2024   Yearly kidney function blood test for diabetes  11/23/2024   Yearly kidney health urinalysis for diabetes  11/23/2024   Complete foot exam   11/23/2024   DEXA scan (bone density measurement)  07/27/2025   Pneumococcal Vaccine for age over 98  Completed   Zoster (Shingles) Vaccine  Completed   HPV Vaccine  Aged Out   Meningitis B Vaccine  Aged Out   Pneumococcal Vaccine  Discontinued   Hepatitis B Vaccine  Discontinued   Colon Cancer Screening  Discontinued   Hepatitis C Screening  Discontinued  *Topic was postponed. The date shown is not the original due date.    Advanced directives: (Copy Requested) Please bring a copy of your  health care power of attorney and living will to the office to be added to your chart at your convenience. You can mail to [facility] [address] [place], [place] [address] or email to [email or web address] Advance Care Planning is important because it:  [x]  Makes sure you receive the medical care that is consistent with your values, goals, and preferences  [x]  It provides guidance to your family and loved ones and reduces their decisional burden about whether or not they are making the right decisions based on your wishes.  Follow the link provided in your after visit summary or read over the paperwork we have mailed to you to help you started getting your Advance Directives in place. If you need assistance in completing these, please reach out to us  so that we can help you!  See attachments for Preventive Care and Fall Prevention Tips.

## 2023-12-05 NOTE — Progress Notes (Signed)
 Subjective:   Cheryl Cisneros is a 78 y.o. who presents for a Medicare Wellness preventive visit.  As a reminder, Annual Wellness Visits don't include a physical exam, and some assessments may be limited, especially if this visit is performed virtually. We may recommend an in-person follow-up visit with your provider if needed.  Visit Complete: In person  Persons Participating in Visit: Patient.  AWV Questionnaire: No: Patient Medicare AWV questionnaire was not completed prior to this visit.  Cardiac Risk Factors include: advanced age (>39men, >91 women);dyslipidemia;hypertension;Other (see comment), Risk factor comments: Asthma     Objective:    Today's Vitals   12/05/23 0907 12/05/23 0935  BP: (!) 179/58 (!) 155/58  Pulse: 82   Resp: 16   Temp: 97.8 F (36.6 C)   TempSrc: Oral   SpO2: 100%   Weight: 118 lb (53.5 kg)   Height: 5' 1 (1.549 m)    Body mass index is 22.3 kg/m.     12/05/2023    9:21 AM 10/25/2022    9:03 AM 10/22/2021    9:18 AM 10/22/2021    8:57 AM 10/16/2020    8:59 AM 08/19/2019    8:59 AM 08/18/2018    8:16 AM  Advanced Directives  Does Patient Have a Medical Advance Directive? Yes Yes Yes No Yes Yes Yes  Type of Estate agent of [place];Living will Healthcare Power of [place];Living will Healthcare Power of [place];Living will;Out of facility DNR (pink MOST or yellow form)  Healthcare Power of [place];Living will Healthcare Power of [place];Living will Healthcare Power of [place];Living will  Does patient want to make changes to medical advance directive? No - Patient declined No - Patient declined No - Patient declined   No - Patient declined No - Patient declined   Copy of Healthcare Power of Attorney in Chart? No - copy requested No - copy requested Yes - validated most recent copy scanned in chart (See row information)  No - copy requested No - copy requested No - copy requested   Would patient like information on creating  a medical advance directive?   No - Patient declined No - Patient declined        Data saved with a previous flowsheet row definition    Current Medications (verified) Outpatient Encounter Medications as of 12/05/2023  Medication Sig   alendronate  (FOSAMAX ) 70 MG tablet TAKE ONE TABLET BY MOUTH ONCE A WEEK IN THE MORNING WITH A FULL GLASS OF WATER, 30 MINUTES BEFORE A MEAL OR BEVERAGE. REMAIN UPRIGHT   aspirin  EC 81 MG tablet Take 1 tablet (81 mg total) by mouth daily.   Biotin 1000 MCG tablet Take 5,000 mcg by mouth 2 (two) times daily.    Calcium-Vitamin D  600-200 MG-UNIT per tablet Take 1 tablet by mouth 3 (three) times daily with meals.   cetirizine  (ZYRTEC ) 10 MG tablet Take 1 tablet by mouth once daily   Cholecalciferol (VITAMIN D3) 1000 UNITS CAPS Take 1 capsule by mouth daily.   CINNAMON PO Take by mouth. 500 mg- 3 per day with meals   CRANBERRY PO Take by mouth. 500 mg daily   EPIPEN 2-PAK 0.3 MG/0.3ML DEVI Reported on 07/26/2015   Ferrous Gluconate (IRON 27 PO) Take by mouth daily.   Garlic Oil 1000 MG CAPS Take 1 capsule by mouth daily.   glucose blood (ONETOUCH ULTRA) test strip USE  STRIP TO CHECK GLUCOSE TWICE DAILY   JARDIANCE 10 MG TABS tablet Take 10 mg by mouth  daily.   L-Lysine 500 MG TABS Take 1 tablet by mouth daily.   Lancets (ONETOUCH DELICA PLUS LANCET33G) MISC As directed   levothyroxine  (SYNTHROID ) 88 MCG tablet Take 1 tablet by mouth once daily   Magnesium 250 MG TABS Take 1 tablet by mouth daily.   metFORMIN  (GLUCOPHAGE ) 1000 MG tablet TAKE 1 TABLET BY MOUTH TWICE DAILY WITH MEALS   nateglinide  (STARLIX ) 120 MG tablet TAKE 1 TABLET BY MOUTH THREE TIMES DAILY WITH MEALS   NONFORMULARY OR COMPOUNDED ITEM viviscal advanced hair health  1 po bid   olmesartan -hydrochlorothiazide (BENICAR  HCT) 40-12.5 MG tablet Take 1 tablet by mouth daily.   Omega-3 Fatty Acids (OMEGA 3 PO) Take 2 tablets by mouth daily.   omeprazole  (PRILOSEC) 40 MG capsule Take 1 capsule by mouth  once daily   OneTouch Delica Lancets 33G MISC USE   TO CHECK GLUCOSE TWICE DAILY   OVER THE COUNTER MEDICATION Maxi-vision 2 capsules per day   pravastatin  (PRAVACHOL ) 10 MG tablet Take 1 tablet by mouth once daily   SELENIUM PO Take by mouth. 200 mcg daily   SIMPLY SALINE NA Place 1 spray into the nose daily.   Specialty Vitamins Products (HAIR NOURISHING SUPPLEMENT PO) Take by mouth. Per patient twice a day after meals.   Turmeric 500 MG CAPS Take 1 capsule by mouth 3 (three) times daily.    vitamin C (ASCORBIC ACID) 500 MG tablet Take 1,000 mg by mouth daily.   No facility-administered encounter medications on file as of 12/05/2023.    Allergies (verified) Losartan  potassium, Metoprolol , and Norvasc  [amlodipine  besylate]   History: Past Medical History:  Diagnosis Date   Allergy    Arthritis    Asthma    Borderline glaucoma    Cataract    Chronic kidney disease    kidney stones   Diabetes (HCC)    GERD (gastroesophageal reflux disease)    Hypertension    Hypothyroidism 10/27/2006   Qualifier: Diagnosis of  By: Antonio ROSALEA Rockers     Macular degeneration 12/25/2011   Osteopenia    Thyroid  disease    Hypothyroidism   Past Surgical History:  Procedure Laterality Date   ABDOMINAL HYSTERECTOMY  1986   TAH BSO   BACK SURGERY  2016   x3 total- 1999, 2016, 2017   bladder surgery     repair   CATARACT EXTRACTION Bilateral    COLONOSCOPY     EYE SURGERY Right 07/28/2017   Dr.Beavis. Cataract removal   KNEE ARTHROSCOPY Right 2002   LITHOTRIPSY     2004   LUMBAR LAMINECTOMY  1999   ROTATOR CUFF REPAIR Right 02/27/2005   TONSILLECTOMY  1963   Family History  Problem Relation Age of Onset   Diabetes Mother    Hypertension Mother    Kidney disease Mother        dialysis   Diabetes Father    Hypertension Father    Cancer Father        lung   Dementia Sister    Diabetes Sister    Hyperlipidemia Sister    Hypertension Sister    Heart disease Sister    Other Sister         blood disorder   Diabetes Sister    Hyperlipidemia Sister    Hypertension Sister    Dementia Sister    Chronic infections Sister    Aneurysm Sister    Diabetes Brother    Hyperlipidemia Brother    Hypertension Brother  Heart disease Brother    Suicidality Brother    Heart disease Brother    Cervical cancer Daughter    Cancer Daughter 14       cervical   Stomach cancer Other    Rectal cancer Other    Coronary artery disease Other    Lung cancer Other    Colon cancer Neg Hx    Esophageal cancer Neg Hx    Social History   Socioeconomic History   Marital status: Married    Spouse name: Not on file   Number of children: Not on file   Years of education: Not on file   Highest education level: Not on file  Occupational History   Occupation: housewife    Employer: UNEMPLOYED  Tobacco Use   Smoking status: Never   Smokeless tobacco: Never  Vaping Use   Vaping status: Never Used  Substance and Sexual Activity   Alcohol use: No    Alcohol/week: 0.0 standard drinks of alcohol   Drug use: No   Sexual activity: Not Currently    Partners: Male  Other Topics Concern   Not on file  Social History Narrative   Exercise--  bootcamp 3 days a week and walking   Social Drivers of Health   Financial Resource Strain: Low Risk  (12/05/2023)   Overall Financial Resource Strain (CARDIA)    Difficulty of Paying Living Expenses: Not very hard  Food Insecurity: No Food Insecurity (12/05/2023)   Hunger Vital Sign    Worried About Running Out of Food in the Last Year: Never true    Ran Out of Food in the Last Year: Never true  Transportation Needs: No Transportation Needs (12/05/2023)   PRAPARE - Administrator, Civil Service (Medical): No    Lack of Transportation (Non-Medical): No  Physical Activity: Sufficiently Active (12/05/2023)   Exercise Vital Sign    Days of Exercise per Week: 7 days    Minutes of Exercise per Session: 40 min  Stress: Stress Concern Present  (12/05/2023)   [employer] of Occupational Health - Occupational Stress Questionnaire    Feeling of Stress: To some extent  Social Connections: Socially Integrated (12/05/2023)   Social Connection and Isolation Panel    Frequency of Communication with Friends and Family: More than three times a week    Frequency of Social Gatherings with Friends and Family: Twice a week    Attends Religious Services: More than 4 times per year    Active Member of [employer] or Organizations: Yes    Attends Engineer, structural: More than 4 times per year    Marital Status: Married    Tobacco Counseling Counseling given: Not Answered    Clinical Intake:  Pre-visit preparation completed: Yes  Pain : No/denies pain     BMI - recorded: 22.3 Nutritional Status: BMI of 19-24  Normal Nutritional Risks: None Diabetes: Yes CBG done?: No  Lab Results  Component Value Date   HGBA1C 7.5 (H) 11/24/2023   HGBA1C 7.0 (H) 05/02/2023   HGBA1C 6.9 (H) 12/04/2021     How often do you need to have someone help you when you read instructions, pamphlets, or other written materials from your doctor or pharmacy?: 1 - Never  Interpreter Needed?: No  Information entered by :: Lolita Libra, CMA   Activities of Daily Living     12/05/2023    9:15 AM  In your present state of health, do you have any difficulty performing the following  activities:  Hearing? 0  Vision? 0  Difficulty concentrating or making decisions? 0  Walking or climbing stairs? 0  Dressing or bathing? 0  Doing errands, shopping? 0  Preparing Food and eating ? N  Using the Toilet? N  In the past six months, have you accidently leaked urine? N  Do you have problems with loss of bowel control? N  Managing your Medications? N  Managing your Finances? N  Housekeeping or managing your Housekeeping? N    Patient Care Team: Antonio Meth, Jamee SAUNDERS, DO as PCP - General Fleeta Smock, Lamar BROCKS, MD as Consulting Physician (Allergy  and Immunology) Dennise Hoes, MD as Consulting Physician (Nephrology) Elma Zachary RAMAN [facility])  I have updated your Care Teams any recent Medical Services you may have received from other providers in the past year.     Assessment:   This is a routine wellness examination for [place].  Hearing/Vision screen Hearing Screening - Comments:: Denies hearing difficulties.  Vision Screening - Comments:: Last eye exam with Dr Ilean at first of this year.   Goals Addressed             This Visit's Progress    Maintain healthy lifestyle.   On track      Depression Screen     12/05/2023    9:18 AM 11/24/2023   10:20 AM 10/25/2022    9:09 AM 10/22/2021    8:58 AM 10/16/2020    9:00 AM 09/19/2020    9:00 AM 08/19/2019    9:01 AM  PHQ 2/9 Scores  PHQ - 2 Score 0 0 0 0 0 0 0  PHQ- 9 Score 1          Fall Risk     11/24/2023   10:20 AM 10/25/2022    9:06 AM 10/22/2021    8:58 AM 10/16/2020    9:00 AM 09/19/2020    9:01 AM  Fall Risk   Falls in the past year? 0 0 0 0 0  Number falls in past yr: 0 0 0 0 0  Injury with Fall? 0 0 0 0 0  Risk for fall due to :  No Fall Risks No Fall Risks    Follow up Falls evaluation completed Falls evaluation completed Falls evaluation completed  Falls prevention discussed  Falls evaluation completed      Data saved with a previous flowsheet row definition  No changes / falls since last evaluation on 11/24/23.  MEDICARE RISK AT HOME:  Medicare Risk at Home Any stairs in or around the home?: Yes If so, are there any without handrails?: No Home free of loose throw rugs in walkways, pet beds, electrical cords, etc?: Yes Adequate lighting in your home to reduce risk of falls?: Yes Life alert?: No Use of a cane, walker or w/c?: No Grab bars in the bathroom?: Yes Shower chair or bench in shower?: No Elevated toilet seat or a handicapped toilet?: Yes  TIMED UP AND GO:  Was the test performed?  Yes  Length of time to ambulate 10 feet: 5 sec Gait  steady and fast without use of assistive device  Cognitive Function: 6CIT completed        12/05/2023    9:22 AM 10/25/2022    9:09 AM 10/22/2021    9:08 AM  6CIT Screen  What Year? 0 points 0 points 0 points  What month? 0 points 0 points 0 points  What time? 0 points 0 points 0 points  Count  back from 20 0 points 0 points 0 points  Months in reverse 0 points 0 points 0 points  Repeat phrase 0 points 0 points 2 points  Total Score 0 points 0 points 2 points    Immunizations Immunization History  Administered Date(s) Administered    sv, Bivalent, Protein Subunit Rsvpref,pf (Abrysvo) 06/06/2023   Fluad Quad(high Dose 65+) 12/09/2018, 01/21/2022, 01/10/2023   Hep A / Hep B 12/09/2018, 01/12/2019   Hepatitis A 07/30/2019   Hepatitis B 07/30/2019   Influenza Split 02/12/2011   Influenza Whole 02/01/2009   Influenza, High Dose Seasonal PF 02/06/2015, 01/04/2016, 03/04/2016, 03/28/2017, 03/31/2018, 04/01/2019, 02/23/2020, 02/22/2021   Influenza,inj,Quad PF,6+ Mos 12/28/2013   Influenza-Unspecified 01/23/2018, 12/30/2019, 01/31/2021   PFIZER(Purple Top)SARS-COV-2 Vaccination 06/14/2019, 07/05/2019, 01/27/2020, 02/23/2020   PNEUMOCOCCAL CONJUGATE-20 06/29/2020   Pneumococcal Conjugate-13 06/24/2013, 12/09/2018   Pneumococcal Polysaccharide-23 12/29/2007, 12/13/2008, 04/14/2014, 03/04/2016, 03/28/2017, 03/31/2018, 04/01/2019, 09/13/2019, 02/23/2020, 02/22/2021, 03/04/2023   Respiratory Syncytial Virus Vaccine,Recomb Aduvanted(Arexvy) 01/21/2022   Td 09/16/2001   Tdap 06/28/2013   Zoster Recombinant(Shingrix) 11/12/2017, 01/23/2018, 07/12/2021   Zoster, Live 11/12/2007    Screening Tests Health Maintenance  Topic Date Due   OPHTHALMOLOGY EXAM  05/23/2022   DTaP/Tdap/Td (3 - Td or Tdap) 06/29/2023   Medicare Annual Wellness (AWV)  10/25/2023   INFLUENZA VACCINE  11/21/2023   COVID-19 Vaccine (5 - 2024-25 season) 12/10/2023 (Originally 12/22/2022)   HEMOGLOBIN A1C  05/26/2024    MAMMOGRAM  05/28/2024   Diabetic kidney evaluation - eGFR measurement  11/23/2024   Diabetic kidney evaluation - Urine ACR  11/23/2024   FOOT EXAM  11/23/2024   DEXA SCAN  07/27/2025   Pneumococcal Vaccine: 50+ Years  Completed   Zoster Vaccines- Shingrix  Completed   HPV VACCINES  Aged Out   Meningococcal B Vaccine  Aged Out   Pneumococcal Vaccine  Discontinued   Hepatitis B Vaccines 19-59 Average Risk  Discontinued   Colonoscopy  Discontinued   Hepatitis C Screening  Discontinued    Health Maintenance  Health Maintenance Due  Topic Date Due   OPHTHALMOLOGY EXAM  05/23/2022   DTaP/Tdap/Td (3 - Td or Tdap) 06/29/2023   Medicare Annual Wellness (AWV)  10/25/2023   INFLUENZA VACCINE  11/21/2023   Health Maintenance Items Addressed: Will get tetanus and flu vaccines at pharmacy. Diabetic eye exam requested from Dr Elma.  Additional Screening:  Vision Screening: Recommended annual ophthalmology exams for early detection of glaucoma and other disorders of the eye. Would you like a referral to an eye doctor? No    Dental Screening: Recommended annual dental exams for proper oral hygiene  Community Resource Referral / Chronic Care Management: CRR required this visit?  No   CCM required this visit?  No   Plan:    I have personally reviewed and noted the following in the patient's chart:   Medical and social history Use of alcohol, tobacco or illicit drugs  Current medications and supplements including opioid prescriptions. Patient is not currently taking opioid prescriptions. Functional ability and status Nutritional status Physical activity Advanced directives List of other physicians Hospitalizations, surgeries, and ER visits in previous 12 months Vitals Screenings to include cognitive, depression, and falls Referrals and appointments  In addition, I have reviewed and discussed with patient certain preventive protocols, quality metrics, and best practice  recommendations. A written personalized care plan for preventive services as well as general preventive health recommendations were provided to patient.   Lolita Libra, CMA   12/05/2023   After Visit Summary: (In  Person-Printed) AVS printed and given to the patient  Notes: see phone note

## 2023-12-23 DIAGNOSIS — J3089 Other allergic rhinitis: Secondary | ICD-10-CM | POA: Diagnosis not present

## 2023-12-23 DIAGNOSIS — J3081 Allergic rhinitis due to animal (cat) (dog) hair and dander: Secondary | ICD-10-CM | POA: Diagnosis not present

## 2023-12-23 DIAGNOSIS — J301 Allergic rhinitis due to pollen: Secondary | ICD-10-CM | POA: Diagnosis not present

## 2023-12-25 ENCOUNTER — Other Ambulatory Visit: Payer: Self-pay | Admitting: Family Medicine

## 2023-12-29 DIAGNOSIS — K080 Exfoliation of teeth due to systemic causes: Secondary | ICD-10-CM | POA: Diagnosis not present

## 2024-01-23 DIAGNOSIS — J3089 Other allergic rhinitis: Secondary | ICD-10-CM | POA: Diagnosis not present

## 2024-01-23 DIAGNOSIS — J3081 Allergic rhinitis due to animal (cat) (dog) hair and dander: Secondary | ICD-10-CM | POA: Diagnosis not present

## 2024-01-23 DIAGNOSIS — J301 Allergic rhinitis due to pollen: Secondary | ICD-10-CM | POA: Diagnosis not present

## 2024-02-10 ENCOUNTER — Telehealth: Payer: Self-pay | Admitting: Family Medicine

## 2024-02-10 MED ORDER — ONETOUCH ULTRA VI STRP: ORAL_STRIP | 1 refills | Status: AC

## 2024-02-10 NOTE — Telephone Encounter (Signed)
 Pt needs her Glucose blood test strips refilled

## 2024-02-10 NOTE — Telephone Encounter (Signed)
 Rx sent.

## 2024-02-18 ENCOUNTER — Other Ambulatory Visit: Payer: Self-pay | Admitting: Family Medicine

## 2024-02-23 DIAGNOSIS — J3081 Allergic rhinitis due to animal (cat) (dog) hair and dander: Secondary | ICD-10-CM | POA: Diagnosis not present

## 2024-02-23 DIAGNOSIS — J301 Allergic rhinitis due to pollen: Secondary | ICD-10-CM | POA: Diagnosis not present

## 2024-02-23 DIAGNOSIS — J3089 Other allergic rhinitis: Secondary | ICD-10-CM | POA: Diagnosis not present

## 2024-03-01 DIAGNOSIS — K080 Exfoliation of teeth due to systemic causes: Secondary | ICD-10-CM | POA: Diagnosis not present

## 2024-03-02 DIAGNOSIS — J3089 Other allergic rhinitis: Secondary | ICD-10-CM | POA: Diagnosis not present

## 2024-03-02 DIAGNOSIS — J301 Allergic rhinitis due to pollen: Secondary | ICD-10-CM | POA: Diagnosis not present

## 2024-03-02 DIAGNOSIS — J3081 Allergic rhinitis due to animal (cat) (dog) hair and dander: Secondary | ICD-10-CM | POA: Diagnosis not present

## 2024-03-02 DIAGNOSIS — J4520 Mild intermittent asthma, uncomplicated: Secondary | ICD-10-CM | POA: Diagnosis not present

## 2024-03-05 DIAGNOSIS — N1832 Chronic kidney disease, stage 3b: Secondary | ICD-10-CM | POA: Diagnosis not present

## 2024-03-08 DIAGNOSIS — N1832 Chronic kidney disease, stage 3b: Secondary | ICD-10-CM | POA: Diagnosis not present

## 2024-03-09 DIAGNOSIS — I129 Hypertensive chronic kidney disease with stage 1 through stage 4 chronic kidney disease, or unspecified chronic kidney disease: Secondary | ICD-10-CM | POA: Diagnosis not present

## 2024-03-09 DIAGNOSIS — D631 Anemia in chronic kidney disease: Secondary | ICD-10-CM | POA: Diagnosis not present

## 2024-03-09 DIAGNOSIS — N1832 Chronic kidney disease, stage 3b: Secondary | ICD-10-CM | POA: Diagnosis not present

## 2024-03-09 DIAGNOSIS — E1122 Type 2 diabetes mellitus with diabetic chronic kidney disease: Secondary | ICD-10-CM | POA: Diagnosis not present

## 2024-03-24 DIAGNOSIS — J301 Allergic rhinitis due to pollen: Secondary | ICD-10-CM | POA: Diagnosis not present

## 2024-03-24 DIAGNOSIS — J3089 Other allergic rhinitis: Secondary | ICD-10-CM | POA: Diagnosis not present

## 2024-03-24 DIAGNOSIS — J3081 Allergic rhinitis due to animal (cat) (dog) hair and dander: Secondary | ICD-10-CM | POA: Diagnosis not present

## 2024-05-18 ENCOUNTER — Other Ambulatory Visit: Payer: Self-pay | Admitting: Family Medicine

## 2024-05-18 DIAGNOSIS — E785 Hyperlipidemia, unspecified: Secondary | ICD-10-CM

## 2024-05-18 DIAGNOSIS — E1151 Type 2 diabetes mellitus with diabetic peripheral angiopathy without gangrene: Secondary | ICD-10-CM

## 2024-05-18 DIAGNOSIS — K219 Gastro-esophageal reflux disease without esophagitis: Secondary | ICD-10-CM

## 2024-05-24 ENCOUNTER — Other Ambulatory Visit: Payer: Self-pay | Admitting: Family Medicine

## 2024-05-24 DIAGNOSIS — E1165 Type 2 diabetes mellitus with hyperglycemia: Secondary | ICD-10-CM

## 2024-05-27 ENCOUNTER — Ambulatory Visit: Admitting: Family Medicine

## 2024-06-07 ENCOUNTER — Ambulatory Visit: Admitting: Family Medicine

## 2024-12-07 ENCOUNTER — Ambulatory Visit
